# Patient Record
Sex: Female | Born: 1980
Health system: Southern US, Community
[De-identification: ages and names within clinical notes are randomized; demographics above are authoritative.]

## PROBLEM LIST (undated history)

## (undated) DIAGNOSIS — I319 Disease of pericardium, unspecified: Secondary | ICD-10-CM

## (undated) DIAGNOSIS — M329 Systemic lupus erythematosus, unspecified: Secondary | ICD-10-CM

## (undated) DIAGNOSIS — R091 Pleurisy: Secondary | ICD-10-CM

## (undated) DIAGNOSIS — J40 Bronchitis, not specified as acute or chronic: Secondary | ICD-10-CM

## (undated) DIAGNOSIS — M797 Fibromyalgia: Secondary | ICD-10-CM

## (undated) DIAGNOSIS — IMO0002 Reserved for concepts with insufficient information to code with codable children: Secondary | ICD-10-CM

## (undated) HISTORY — DX: Pleurisy: R09.1

## (undated) HISTORY — PX: APPENDECTOMY: SHX54

## (undated) HISTORY — PX: ANKLE SURGERY: SHX546

---

## 2006-12-27 ENCOUNTER — Emergency Department (HOSPITAL_COMMUNITY): Admission: EM | Admit: 2006-12-27 | Discharge: 2006-12-28 | Payer: Self-pay | Admitting: *Deleted

## 2007-08-17 ENCOUNTER — Emergency Department (HOSPITAL_COMMUNITY): Admission: EM | Admit: 2007-08-17 | Discharge: 2007-08-17 | Payer: Self-pay | Admitting: Emergency Medicine

## 2007-08-20 ENCOUNTER — Observation Stay (HOSPITAL_COMMUNITY): Admission: RE | Admit: 2007-08-20 | Discharge: 2007-08-21 | Payer: Self-pay | Admitting: Orthopedic Surgery

## 2011-02-13 NOTE — Op Note (Signed)
Diana Huynh, Diana Huynh          ACCOUNT NO.:  [id number]   MEDICAL RECORD NO.:  [id number]          PATIENT TYPE:  OIB   LOCATION:  5041                         FACILITY:  [facility]   PHYSICIAN:  Nadara Mustard, MD     DATE OF BIRTH:  1981-06-05   DATE OF PROCEDURE:  08/20/2007  DATE OF DISCHARGE:                               OPERATIVE REPORT   PREOPERATIVE DIAGNOSIS:  Trimalleolar right ankle fracture.   POSTOPERATIVE DIAGNOSIS:  Trimalleolar right ankle fracture.   PROCEDURE:  Open reduction internal fixation, right ankle.   SURGEON:  Nadara Mustard, MD   ANESTHESIA:  General.   ESTIMATED BLOOD LOSS:  Minimal.   ANTIBIOTICS:  1 gram of Kefzol.   DRAINS:  None.   COMPLICATIONS:  None.   TOURNIQUET TIME:  None.   DISPOSITION:  To PACU in stable condition.   INDICATIONS FOR PROCEDURE:  The patient is a 30 year old woman who fell  while skating sustaining a trimalleolar right ankle fracture.  The  patient was splinted and presented to the office yesterday and presents  today for open reduction internal fixation.  She had a displaced ankle  fracture which was unstable with displacement of the lateral malleolus,  medial malleolus and a small posterior trimalleolar fracture.  Risks and  benefits of surgery were discussed including infection, neurovascular  injury, persistent pain, nonhealing of the wound, the risks of wound  healing with smoking.  The patient states she understands and wished to  proceed with surgery at this time.   DESCRIPTION OF PROCEDURE:  The patient was brought to OR room 4 and  underwent a general anesthetic.  After adequate level of anesthesia  obtained, the patient's right lower extremity was prepped using DuraPrep  and draped into a sterile field.  A lateral incision was made.  This was  carried down sharply to the fracture site.  The fracture edges were  freshened and reduced.  A lag screw was placed to secure the fracture  site with the screw  perpendicular to the fracture.  A neutralization  plate was applied laterally with two screws distally and three screws  proximally.  The wound was irrigated with normal saline.  C-arm  fluoroscopy verified reduction.  The subcu was closed using 2-0 Vicryl.  Skin was closed using Proximate staples.  Attention was then focused on  the medial malleolus.  Incision was made over the medial malleolus.  This was carried sharply down to the fracture site.  The edges were  freshened.  Both the medial lateral wounds were irrigated.  The joint  was irrigated and the fracture reduced.  Two 2.5 drill bits were then  placed across the fracture site and each drill bit was sequentially  removed and replaced with a 207 45 mm length partially threaded 3.5  cortical screw.  C-arm fluoroscopy verified reduction in  both AP and lateral planes.  The wound was again irrigated.  Subcu was  closed using 2-0 Vicryl.  Skin was closed using Proximate staples.  The  wound was covered with Adaptic orthopedic sponges, sterile Webril and  Coban dressing.  The patient was  extubated, taken to PACU in stable  condition.      Nadara Mustard, MD  Electronically Signed     MVD/MEDQ  D:  08/20/2007  T:  08/20/2007  Job:  [id number]

## 2011-07-10 LAB — HCG, SERUM, QUALITATIVE: Preg, Serum: NEGATIVE

## 2011-07-10 LAB — CBC
Hemoglobin: 12.6
RDW: 14.5

## 2012-08-20 ENCOUNTER — Emergency Department (HOSPITAL_COMMUNITY)
Admission: EM | Admit: 2012-08-20 | Discharge: 2012-08-20 | Disposition: A | Discharge status: Home / Self Care | Payer: Medicaid Other | Attending: Emergency Medicine | Admitting: Emergency Medicine

## 2012-08-20 ENCOUNTER — Encounter (HOSPITAL_COMMUNITY): Payer: Self-pay | Admitting: *Deleted

## 2012-08-20 DIAGNOSIS — F17200 Nicotine dependence, unspecified, uncomplicated: Secondary | ICD-10-CM | POA: Insufficient documentation

## 2012-08-20 DIAGNOSIS — R61 Generalized hyperhidrosis: Secondary | ICD-10-CM | POA: Insufficient documentation

## 2012-08-20 DIAGNOSIS — R197 Diarrhea, unspecified: Secondary | ICD-10-CM | POA: Insufficient documentation

## 2012-08-20 DIAGNOSIS — K2990 Gastroduodenitis, unspecified, without bleeding: Secondary | ICD-10-CM | POA: Insufficient documentation

## 2012-08-20 DIAGNOSIS — R112 Nausea with vomiting, unspecified: Secondary | ICD-10-CM | POA: Insufficient documentation

## 2012-08-20 DIAGNOSIS — K2970 Gastritis, unspecified, without bleeding: Secondary | ICD-10-CM | POA: Insufficient documentation

## 2012-08-20 LAB — CBC WITH DIFFERENTIAL/PLATELET
Basophils Absolute: 0 10*3/uL (ref 0.0–0.1)
Basophils Relative: 0 % (ref 0–1)
Eosinophils Absolute: 0.1 10*3/uL (ref 0.0–0.7)
Eosinophils Relative: 1 % (ref 0–5)
Lymphs Abs: 1.2 10*3/uL (ref 0.7–4.0)
MCV: 84 fL (ref 78.0–100.0)
Monocytes Absolute: 0.5 10*3/uL (ref 0.1–1.0)
Neutrophils Relative %: 84 % — ABNORMAL HIGH (ref 43–77)
RBC: 4.82 MIL/uL (ref 3.87–5.11)

## 2012-08-20 LAB — COMPREHENSIVE METABOLIC PANEL
Albumin: 4.2 g/dL (ref 3.5–5.2)
Alkaline Phosphatase: 53 U/L (ref 39–117)
CO2: 25 mEq/L (ref 19–32)
Chloride: 100 mEq/L (ref 96–112)
GFR calc non Af Amer: >90 mL/min (ref >90)
Sodium: 138 mEq/L (ref 135–145)
Total Bilirubin: 0.6 mg/dL (ref 0.3–1.2)
Total Protein: 8.1 g/dL (ref 6.0–8.3)

## 2012-08-20 LAB — URINE MICROSCOPIC-ADD ON

## 2012-08-20 LAB — URINALYSIS, ROUTINE W REFLEX MICROSCOPIC: Glucose, UA: NEGATIVE mg/dL

## 2012-08-20 LAB — LIPASE, BLOOD: Lipase: 17 U/L (ref 11–59)

## 2012-08-20 MED ORDER — FAMOTIDINE 20 MG PO TABS: 20.0000 mg | ORAL_TABLET | Freq: Two times a day (BID) | ORAL | Status: DC

## 2012-08-20 MED ORDER — SODIUM CHLORIDE 0.9 % IV BOLUS (SEPSIS)
1000.0000 mL | Freq: Once | INTRAVENOUS | Status: AC
  Administered 2012-08-20: 1000 mL via INTRAVENOUS

## 2012-08-20 MED ORDER — OMEPRAZOLE 20 MG PO CPDR: 40.0000 mg | DELAYED_RELEASE_CAPSULE | Freq: Every day | ORAL | Status: DC

## 2012-08-20 MED ORDER — GI COCKTAIL ~~LOC~~
30.0000 mL | Freq: Once | ORAL | Status: AC
  Administered 2012-08-20: 30 mL via ORAL
  Filled 2012-08-20: qty 30

## 2012-08-20 MED ORDER — POTASSIUM CHLORIDE CRYS ER 20 MEQ PO TBCR
40.0000 meq | EXTENDED_RELEASE_TABLET | Freq: Once | ORAL | Status: AC
  Administered 2012-08-20: 40 meq via ORAL
  Filled 2012-08-20: qty 2

## 2012-08-20 MED ORDER — MORPHINE SULFATE 4 MG/ML IJ SOLN
4.0000 mg | Freq: Once | INTRAMUSCULAR | Status: AC
  Administered 2012-08-20: 4 mg via INTRAVENOUS
  Filled 2012-08-20: qty 1

## 2012-08-20 MED ORDER — ONDANSETRON HCL 4 MG/2ML IJ SOLN
4.0000 mg | Freq: Once | INTRAMUSCULAR | Status: AC
  Administered 2012-08-20: 4 mg via INTRAVENOUS
  Filled 2012-08-20: qty 2

## 2012-08-20 MED ORDER — PANTOPRAZOLE SODIUM 40 MG IV SOLR
40.0000 mg | Freq: Once | INTRAVENOUS | Status: AC
  Administered 2012-08-20: 40 mg via INTRAVENOUS
  Filled 2012-08-20: qty 40

## 2012-08-20 NOTE — ED Provider Notes (Signed)
History     CSN: [id number]  Arrival date & time 08/20/12  4098   First MD Initiated Contact with Patient 08/20/12 2011      Chief Complaint  Patient presents with  . Abdominal Pain   HPI  History provided by the patient. Patient is a 31 year old female with history of appendectomy who presents with complaints of upper abdominal pain with episodes of vomiting. Patient reports symptoms began acutely around 5:30 AM this morning and have been recurrent throughout the day. She reports a constant and generalized ache and pain in her abdomen. Pain initially started lower but is primarily in the upper abdomen. There are episodes of worsening pains. She reports at least one episode of vomiting every hour. She also reports 2 episodes of loose "diarrhea" like stools. Patient denies any associated fever or chills but has some hot flashes and sweats. Patient also reports beginning her menstrual cycle early 2 days ago. She denies any other significant changes in menstruation. She has not had any recent vaginal discharge, rash or irritation. Denies any dysuria, hematuria, urinary frequency or flank pain.   History reviewed. No pertinent past medical history.  Past Surgical History  Procedure Date  . Appendectomy     History reviewed. No pertinent family history.  History  Substance Use Topics  . Smoking status: Current Every Day Smoker  . Smokeless tobacco: Not on file  . Alcohol Use: Yes    OB History    Grav Para Term Preterm Abortions TAB SAB Ect Mult Living                  Review of Systems  Constitutional: Positive for diaphoresis. Negative for fever and chills.  Respiratory: Negative for cough and shortness of breath.   Gastrointestinal: Positive for nausea, vomiting, abdominal pain and diarrhea. Negative for blood in stool and rectal pain.  Genitourinary: Negative for dysuria, frequency, hematuria, flank pain, vaginal discharge, vaginal pain and pelvic pain.  Skin: Negative for  rash.  All other systems reviewed and are negative.    Allergies  Review of patient's allergies indicates no known allergies.  Home Medications   Current Outpatient Rx  Name  Route  Sig  Dispense  Refill  . ADULT MULTIVITAMIN W/MINERALS CH   Oral   Take 1 tablet by mouth daily.         Marland Kitchen NAPROXEN SODIUM 220 MG PO TABS   Oral   Take 220 mg by mouth as needed. Pain           BP 123/71  Pulse 94  Temp 98.6 F (37 C) (Oral)  Resp 18  SpO2 99%  LMP 08/20/2012  Physical Exam  Nursing note and vitals reviewed. Constitutional: She is oriented to person, place, and time. She appears well-developed and well-nourished. No distress.  HENT:  Head: Normocephalic.  Cardiovascular: Normal rate and regular rhythm.   No murmur heard. Pulmonary/Chest: Effort normal and breath sounds normal. No respiratory distress. She has no wheezes. She has no rales.  Abdominal: Soft. There is no hepatosplenomegaly. There is tenderness in the right upper quadrant and epigastric area. There is no rebound, no guarding, no CVA tenderness, no tenderness at McBurney's point and negative Murphy's sign.       Mild diffuse tenderness. Moderate tenderness in the epigastric and right upper quadrant  Neurological: She is alert and oriented to person, place, and time.  Skin: Skin is warm and dry. No rash noted.  Psychiatric: She has a normal mood  and affect. Her behavior is normal.    ED Course  Procedures   Results for orders placed during the hospital encounter of 08/20/12  CBC WITH DIFFERENTIAL      Component Value Range   WBC 11.5 (*) 4.0 - 10.5 K/uL   RBC 4.82  3.87 - 5.11 MIL/uL   Hemoglobin 13.2  12.0 - 15.0 g/dL   HCT 16.1  09.6 - 04.5 %   MCV 84.0  78.0 - 100.0 fL   MCH 27.4  26.0 - 34.0 pg   MCHC 32.6  30.0 - 36.0 g/dL   RDW 40.9  81.1 - 91.4 %   Platelets 287  150 - 400 K/uL   Neutrophils Relative 84 (*) 43 - 77 %   Neutro Abs 9.6 (*) 1.7 - 7.7 K/uL   Lymphocytes Relative 11 (*) 12 -  46 %   Lymphs Abs 1.2  0.7 - 4.0 K/uL   Monocytes Relative 5  3 - 12 %   Monocytes Absolute 0.5  0.1 - 1.0 K/uL   Eosinophils Relative 1  0 - 5 %   Eosinophils Absolute 0.1  0.0 - 0.7 K/uL   Basophils Relative 0  0 - 1 %   Basophils Absolute 0.0  0.0 - 0.1 K/uL  COMPREHENSIVE METABOLIC PANEL      Component Value Range   Sodium 138  135 - 145 mEq/L   Potassium 3.3 (*) 3.5 - 5.1 mEq/L   Chloride 100  96 - 112 mEq/L   CO2 25  19 - 32 mEq/L   Glucose, Bld 94  70 - 99 mg/dL   BUN 10  6 - 23 mg/dL   Creatinine, Ser 7.82  0.50 - 1.10 mg/dL   Calcium 9.5  8.4 - 95.6 mg/dL   Total Protein 8.1  6.0 - 8.3 g/dL   Albumin 4.2  3.5 - 5.2 g/dL   AST 18  0 - 37 U/L   ALT 20  0 - 35 U/L   Alkaline Phosphatase 53  39 - 117 U/L   Total Bilirubin 0.6  0.3 - 1.2 mg/dL   GFR calc non Af Amer >90  >90 mL/min   GFR calc Af Amer >90  >90 mL/min  URINALYSIS, ROUTINE W REFLEX MICROSCOPIC      Component Value Range   Color, Urine AMBER (*) YELLOW   APPearance CLOUDY (*) CLEAR   Specific Gravity, Urine 1.035 (*) 1.005 - 1.030   pH 6.5  5.0 - 8.0   Glucose, UA NEGATIVE  NEGATIVE mg/dL   Hgb urine dipstick LARGE (*) NEGATIVE   Bilirubin Urine NEGATIVE  NEGATIVE   Ketones, ur TRACE (*) NEGATIVE mg/dL   Protein, ur 30 (*) NEGATIVE mg/dL   Urobilinogen, UA 1.0  0.0 - 1.0 mg/dL   Nitrite NEGATIVE  NEGATIVE   Leukocytes, UA SMALL (*) NEGATIVE  LIPASE, BLOOD      Component Value Range   Lipase 17  11 - 59 U/L  POCT PREGNANCY, URINE      Component Value Range   Preg Test, Ur NEGATIVE  NEGATIVE  URINE MICROSCOPIC-ADD ON      Component Value Range   Squamous Epithelial / LPF MANY (*) RARE   WBC, UA 0-2  <3 WBC/hpf   RBC / HPF 3-6  <3 RBC/hpf   Bacteria, UA RARE  RARE       1. Gastritis       MDM  8:15 PM patient seen and evaluated. Patient lying in  bed appears comfortable in no acute distress.  Patient with significant tenderness in the right upper quadrant and epigastric area. And no clear  Murphy's sign. At this time suspect possible biliary source versus gastritis. Will begin workup with labs and medications.        Angus Seller, [place] 08/22/12 [id number]

## 2012-08-20 NOTE — ED Notes (Signed)
Pt c/o abdominal pain and vomiting since this am, sts shes on her mens. Cycle, c/o cramps, sts shes having "hot flashes", denies fever

## 2012-08-20 NOTE — ED Notes (Signed)
MD at bedside. 

## 2012-08-23 NOTE — ED Provider Notes (Signed)
Medical screening examination/treatment/procedure(s) were performed by non-physician practitioner and as supervising physician I was immediately available for consultation/collaboration.   Kamila Broda, MD 08/23/12 0738 

## 2014-11-01 ENCOUNTER — Emergency Department (HOSPITAL_COMMUNITY)
Admission: EM | Admit: 2014-11-01 | Discharge: 2014-11-01 | Disposition: A | Discharge status: Home / Self Care | Payer: Medicaid Other | Attending: Emergency Medicine | Admitting: Emergency Medicine

## 2014-11-01 ENCOUNTER — Encounter (HOSPITAL_COMMUNITY): Payer: Self-pay | Admitting: Emergency Medicine

## 2014-11-01 DIAGNOSIS — Z3202 Encounter for pregnancy test, result negative: Secondary | ICD-10-CM | POA: Insufficient documentation

## 2014-11-01 DIAGNOSIS — Y998 Other external cause status: Secondary | ICD-10-CM | POA: Insufficient documentation

## 2014-11-01 DIAGNOSIS — M791 Myalgia, unspecified site: Secondary | ICD-10-CM

## 2014-11-01 DIAGNOSIS — Y9289 Other specified places as the place of occurrence of the external cause: Secondary | ICD-10-CM | POA: Insufficient documentation

## 2014-11-01 DIAGNOSIS — Z79899 Other long term (current) drug therapy: Secondary | ICD-10-CM | POA: Insufficient documentation

## 2014-11-01 DIAGNOSIS — S161XXA Strain of muscle, fascia and tendon at neck level, initial encounter: Secondary | ICD-10-CM | POA: Insufficient documentation

## 2014-11-01 DIAGNOSIS — Z720 Tobacco use: Secondary | ICD-10-CM | POA: Insufficient documentation

## 2014-11-01 DIAGNOSIS — Y9389 Activity, other specified: Secondary | ICD-10-CM | POA: Insufficient documentation

## 2014-11-01 LAB — POC URINE PREG, ED: Preg Test, Ur: NEGATIVE

## 2014-11-01 MED ORDER — IBUPROFEN 800 MG PO TABS
800.0000 mg | ORAL_TABLET | Freq: Once | ORAL | Status: AC
  Administered 2014-11-01: 800 mg via ORAL
  Filled 2014-11-01: qty 1

## 2014-11-01 MED ORDER — HYDROCODONE-ACETAMINOPHEN 5-325 MG PO TABS: 1.0000 | ORAL_TABLET | Freq: Four times a day (QID) | ORAL | Status: DC | PRN

## 2014-11-01 MED ORDER — CYCLOBENZAPRINE HCL 10 MG PO TABS: 10.0000 mg | ORAL_TABLET | Freq: Every day | ORAL | Status: DC

## 2014-11-01 MED ORDER — IBUPROFEN 800 MG PO TABS: 800.0000 mg | ORAL_TABLET | Freq: Three times a day (TID) | ORAL | Status: DC

## 2014-11-01 MED ORDER — HYDROCODONE-ACETAMINOPHEN 5-325 MG PO TABS
1.0000 | ORAL_TABLET | Freq: Once | ORAL | Status: AC
  Administered 2014-11-01: 1 via ORAL
  Filled 2014-11-01: qty 1
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
## 2014-11-01 NOTE — Discharge Instructions (Signed)
Call for a follow up appointment with a Family or Primary Care Provider.  Return if Symptoms worsen.   Take medication as prescribed.  Ice your neck and shoulder 3-4 times a day.  Emergency Department Resource Guide 1) Find a Doctor and Pay Out of Pocket Although you won't have to find out who is covered by your insurance plan, it is a good idea to ask around and get recommendations. You will then need to call the office and see if the doctor you have chosen will accept you as a new patient and what types of options they offer for patients who are self-pay. Some doctors offer discounts or will set up payment plans for their patients who do not have insurance, but you will need to ask so you aren't surprised when you get to your appointment.  2) Contact Your Local Health Department Not all health departments have doctors that can see patients for sick visits, but many do, so it is worth a call to see if yours does. If you don't know where your local health department is, you can check in your phone book. The CDC also has a tool to help you locate your state's health department, and many state websites also have listings of all of their local health departments.  3) Find a Walk-in Clinic If your illness is not likely to be very severe or complicated, you may want to try a walk in clinic. These are popping up all over the country in pharmacies, drugstores, and shopping centers. They're usually staffed by nurse practitioners or physician assistants that have been trained to treat common illnesses and complaints. They're usually fairly quick and inexpensive. However, if you have serious medical issues or chronic medical problems, these are probably not your [place] option.  No Primary Care Doctor: - Call Health Connect at  [phone] - they can help you locate a primary care doctor that  accepts your insurance, provides certain services, etc. - Physician Referral Service- [phone]  Chronic Pain  Problems: Organization         Address  Phone   Notes  [facility]  [phone] Patients need to be referred by their primary care doctor.   Medication Assistance: Organization         Address  Phone   Notes  [facility] [address] [place], [place] [address] [phone] --Must be a resident of [facility] [place] -- Must have NO insurance coverage whatsoever (no Medicaid/ Medicare, etc.) -- The pt. MUST have a primary care doctor that directs their care regularly and follows them in the community   MedAssist  [phone]   [employer]  [phone]    Agencies that provide inexpensive medical care: Organization         Address  Phone   Notes  [facility]  [phone]   [facility]    [phone]   [facility] [address] [place], [place] [address] [phone]   [facility] [address], [place] [phone]   [facility]    [phone]   [facility]    [phone]   [facility]  [address], [place] Phone:  [phone], Fax:  [phone] Hours of Operation:  9 am - 6 pm, M-F.  Also accepts Medicaid/Medicare and self-pay.  [facility]  [address], [place] Phone: [phone], Fax: [phone]. Hours of Operation:  8:30 am - 5:30 pm, M-F.  Also accepts Medicaid and self-pay.  °[facility] [place] [address], [place] Phone: [phone]   °[facility] [address], [place], [place] [phone], Ext. [phone] Mondays & Thursdays: 7-9 AM.  First 15 patients are seen on a first come, first serve basis. °  ° °Medicaid-accepting [place] Providers: ° °Organization         Address  Phone   Notes  °[facility] [address], [place] [phone] Also  accepts self-pay patients.  °[facility] [address], [place] ° [phone]   °[facility] [address], [place] [phone]   °[facility] [address] [place] [address], [place] [phone]   °Veita Bland [address], [place]  ° [phone] Only accepts [place] Access Medicaid patients after they have their name applied to their card.  ° °Self-Pay (no insurance) in [place]: ° °Organization         Address  Phone   Notes  °Sickle Cell Patients, [facility] [address], [place] [phone]   °[facility] [address], [place] [phone]   °[facility] ° [address], [place] [phone]   °[facility]/Dr. Osei-Bonsu ° [address] [place] [address], [place] or [address], [place] [phone] Phone number for both [place] and [place] locations is the same.  °[facility] [address], [place] [phone]   °[facility] [address] [place] [address], [place] or [address] [phone] °([phone]   °[facility] [address], [place] [phone], phone; [phone], fax Sees patients 1st and 3rd Saturday of every month.  Must not qualify for public or private insurance (i.e. Medicaid, Medicare, [place] Health Choice, Veterans' Benefits) • Household income should be no more than 200% of the poverty level •The clinic cannot treat you if you are pregnant or think you are pregnant • Sexually transmitted diseases are not treated at the clinic.  ° ° °Dental Care: °Organization         Address  Phone  Notes  °[place] [facility] [address], [place] [phone] Accepts children up to age 21 who are enrolled in Medicaid or [place] Health Choice; pregnant  women with a Medicaid card; and children who have applied for Medicaid or [place] Health Choice, but were declined, whose parents can pay a reduced fee at time of service.  °[place] [facility] [place]  [address], [place] [phone] Accepts children up to age 21 who are enrolled in Medicaid or [place] Health Choice; pregnant women with a Medicaid card; and children who have applied for Medicaid or  Health Choice, but were declined, whose parents can pay a reduced fee at time of service.  °[facility] ° [address],  [phone] Patients are seen by appointment only. Walk-ins are not accepted. [facility] will see patients 18 years of age and older. °Monday - Tuesday (8am-5pm) °Most Wednesdays (8:30-5pm) °$30 per visit, cash only  °[facility]  [address], [facility] [phone] Patients are seen by appointment only. Walk-ins are not accepted. [place] will see patients 60 years of age and older. One Wednesday Evening (Monthly: Volunteer Based).  $30 per visit, cash only  [place]  [phone] for adults; Children under age 4, call Graduate Pediatric Dentistry at [phone]. Children aged 49-14, please call [phone] to request a pediatric application.  Dental services are provided in all areas of dental care including fillings, crowns and bridges, complete and partial dentures, implants, gum treatment, root canals, and extractions. Preventive care is also provided. Treatment is provided to both adults and children. Patients are selected via a lottery and there is often a waiting list.   [facility] [address] [place]  [phone] [email or web address]   [facility] [address] [place], [place] [phone], Ext. [phone] Second and Fourth Thursday of each month, opens at 6:30 AM; Clinic ends at 9 AM.  Patients are  seen on a first-come first-served basis, and a limited number are seen during each clinic.   [facility]  [address] Hillard Danker [place], [place] [phone]   Eligibility Requirements You must have lived in [place], [place], or [place] counties for at least the last three months.   You cannot be eligible for state or federal sponsored [employer], including [employer], [place], or [employer].   You generally cannot be eligible for healthcare insurance through your employer.    How to apply: Eligibility screenings are held every Tuesday and Wednesday afternoon from 1:00 pm until 4:00 pm. You do not need an appointment for the interview!  [facility] [address] [place], [place]   [place] Department  [place]  [phone]    Behavioral Health Resources in the Community: Intensive Outpatient Programs Organization         Address  Phone  Notes  [facility] [place]. [address], [place], [place] [phone]   [facility] [address], [place], [place]   ADS: [facility] [address], [place], [place]   [place] [address],  [place], [place] or [phone]   Substance Abuse Resources Organization         Address  Phone  Notes  Al[facility] Drug Services  [phone]   [place]  [phone]   [facility] [place]   Chinita Pester  [phone]   Residential & Outpatient Substance Abuse Program  [phone]   Psychological Services Organization         Address  Phone  Notes  [facility] [place]  [place]  [phone]   [place] [address] [place] [phone] or [phone]    Mobile Crisis  Teams Organization         Address  Phone  Notes  [facility], Mobile Crisis Care Unit  [phone]   [facility]  [address] [place], [place]   [facility] Levels [address] [place] [phone]    Self-Help/Support Groups Organization         Address  Phone             Notes  [facility] [place] - variety of support groups  [place] Call for more information  Narcotics Anonymous (NA), [facility] [address], [employer] [place]  2 meetings at this location   Special educational needs teacher         Address  Phone  Notes  [facility] [place],    [place]  [phone]   [facility]  [address] [place] [address], [place], [place]   [place], [place] [phone] Admissions: 8am-3pm M-F  Incentives Substance [place] [address],    [place], [place] [phone]   [facility] [address] [place], [place], [place]   [facility] [address]  [place], [place]   [facility] - Intensive Outpatient [address], [place], [place]   [facility] ([place]  [place], [place] [phone] or [phone]   [facility] ([facility]) [address] [place], [place] Accepts Medicaid  [facility] [place] [address],  [place] [phone] Substance Abuse/Addiction Treatment   [facility] Organization         Address  Phone  Notes  [facility]  [phone]   Domenic Schwab, PhD [address] Arlis [place], [place]   [phone] or [phone]   [facility] [place], [place] [phone]   [facility] [address] [place], [place] [phone]  Insurance/Medicaid/sponsorship through [facility] and Families [address] [place]                                    [place], [place] [phone] [place] [address], [place] [phone]    Dr. Adele Schilder  [phone]   [facility]. 1) [address], [place] 2) [place] 3)  [place] [address], [place] [phone]  [phone]   [place] [phone] or [phone] (After Hours)
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
## 2015-04-19 ENCOUNTER — Emergency Department (HOSPITAL_COMMUNITY)
Admission: EM | Admit: 2015-04-19 | Discharge: 2015-04-19 | Disposition: A | Discharge status: Home / Self Care | Payer: Self-pay | Attending: Emergency Medicine | Admitting: Emergency Medicine

## 2015-04-19 ENCOUNTER — Emergency Department (HOSPITAL_COMMUNITY): Payer: Medicaid Other

## 2015-04-19 ENCOUNTER — Encounter (HOSPITAL_COMMUNITY): Payer: Self-pay

## 2015-04-19 DIAGNOSIS — R000 Tachycardia, unspecified: Secondary | ICD-10-CM | POA: Insufficient documentation

## 2015-04-19 DIAGNOSIS — Z720 Tobacco use: Secondary | ICD-10-CM | POA: Insufficient documentation

## 2015-04-19 DIAGNOSIS — Z79899 Other long term (current) drug therapy: Secondary | ICD-10-CM | POA: Insufficient documentation

## 2015-04-19 DIAGNOSIS — R079 Chest pain, unspecified: Secondary | ICD-10-CM

## 2015-04-19 DIAGNOSIS — M549 Dorsalgia, unspecified: Secondary | ICD-10-CM | POA: Insufficient documentation

## 2015-04-19 DIAGNOSIS — R0789 Other chest pain: Secondary | ICD-10-CM | POA: Insufficient documentation

## 2015-04-19 LAB — CBC WITH DIFFERENTIAL/PLATELET
Basophils Absolute: 0 10*3/uL (ref 0.0–0.1)
Basophils Relative: 1 % (ref 0–1)
EOS ABS: 0.1 10*3/uL (ref 0.0–0.7)
Eosinophils Relative: 2 % (ref 0–5)
HCT: 38 % (ref 36.0–46.0)
Hemoglobin: 12.6 g/dL (ref 12.0–15.0)
Lymphocytes Relative: 24 % (ref 12–46)
Lymphs Abs: 1.4 10*3/uL (ref 0.7–4.0)
MCH: 29 pg (ref 26.0–34.0)
MCHC: 33.2 g/dL (ref 30.0–36.0)
MCV: 87.6 fL (ref 78.0–100.0)
Monocytes Absolute: 0.4 10*3/uL (ref 0.1–1.0)
Monocytes Relative: 6 % (ref 3–12)
NEUTROS ABS: 3.9 10*3/uL (ref 1.7–7.7)
Neutrophils Relative %: 67 % (ref 43–77)
PLATELETS: 268 10*3/uL (ref 150–400)
RBC: 4.34 MIL/uL (ref 3.87–5.11)
RDW: 14.2 % (ref 11.5–15.5)
WBC: 5.7 10*3/uL (ref 4.0–10.5)

## 2015-04-19 LAB — TROPONIN I: Troponin I: <0.03 ng/mL (ref <0.031)

## 2015-04-19 LAB — BASIC METABOLIC PANEL
Anion gap: 5 (ref 5–15)
BUN: 11 mg/dL (ref 6–20)
CO2: 27 mmol/L (ref 22–32)
Calcium: 9 mg/dL (ref 8.9–10.3)
Chloride: 105 mmol/L (ref 101–111)
Creatinine, Ser: 0.67 mg/dL (ref 0.44–1.00)
GFR calc Af Amer: >60 mL/min (ref >60)
Glucose, Bld: 99 mg/dL (ref 65–99)
Potassium: 4.1 mmol/L (ref 3.5–5.1)
Sodium: 137 mmol/L (ref 135–145)

## 2015-04-19 LAB — D-DIMER, QUANTITATIVE (NOT AT ARMC): D DIMER QUANT: 0.27 ug{FEU}/mL (ref 0.00–0.48)

## 2015-04-19 MED ORDER — METHOCARBAMOL 500 MG PO TABS: 500.0000 mg | ORAL_TABLET | Freq: Two times a day (BID) | ORAL | Status: DC

## 2015-04-19 MED ORDER — OXYCODONE-ACETAMINOPHEN 5-325 MG PO TABS: 2.0000 | ORAL_TABLET | ORAL | Status: DC | PRN

## 2015-04-19 MED ORDER — SODIUM CHLORIDE 0.9 % IV SOLN: INTRAVENOUS | Status: DC

## 2015-04-19 MED ORDER — KETOROLAC TROMETHAMINE 30 MG/ML IJ SOLN
30.0000 mg | Freq: Once | INTRAMUSCULAR | Status: AC
  Administered 2015-04-19: 30 mg via INTRAVENOUS
  Filled 2015-04-19: qty 1

## 2015-04-19 MED ORDER — MORPHINE SULFATE 4 MG/ML IJ SOLN
4.0000 mg | Freq: Once | INTRAMUSCULAR | Status: AC
  Administered 2015-04-19: 4 mg via INTRAVENOUS
  Filled 2015-04-19: qty 1

## 2015-04-19 MED ORDER — SODIUM CHLORIDE 0.9 % IV BOLUS (SEPSIS)
1000.0000 mL | Freq: Once | INTRAVENOUS | Status: AC
  Administered 2015-04-19: 1000 mL via INTRAVENOUS

## 2015-04-19 MED ORDER — LORAZEPAM 2 MG/ML IJ SOLN
1.0000 mg | Freq: Once | INTRAMUSCULAR | Status: AC
  Administered 2015-04-19: 1 mg via INTRAVENOUS
  Filled 2015-04-19: qty 1

## 2015-04-19 NOTE — Discharge Instructions (Signed)
Chest Wall Pain °Chest wall pain is pain in or around the bones and muscles of your chest. It may take up to 6 weeks to get better. It may take longer if you must stay physically active in your work and activities.  °CAUSES  °Chest wall pain may happen on its own. However, it may be caused by: °· A viral illness like the flu. °· Injury. °· Coughing. °· Exercise. °· Arthritis. °· Fibromyalgia. °· Shingles. °HOME CARE INSTRUCTIONS  °· Avoid overtiring physical activity. Try not to strain or perform activities that cause pain. This includes any activities using your chest or your abdominal and side muscles, especially if heavy weights are used. °· Put ice on the sore area. °¨ Put ice in a plastic bag. °¨ Place a towel between your skin and the bag. °¨ Leave the ice on for 15-20 minutes per hour while awake for the first 2 days. °· Only take over-the-counter or prescription medicines for pain, discomfort, or fever as directed by your caregiver. °SEEK IMMEDIATE MEDICAL CARE IF:  °· Your pain increases, or you are very uncomfortable. °· You have a fever. °· Your chest pain becomes worse. °· You have new, unexplained symptoms. °· You have nausea or vomiting. °· You feel sweaty or lightheaded. °· You have a cough with phlegm (sputum), or you cough up blood. °MAKE SURE YOU:  °· Understand these instructions. °· Will watch your condition. °· Will get help right away if you are not doing well or get worse. °Document Released: 09/17/2005 Document Revised: 12/10/2011 Document Reviewed: 05/14/2011 °ExitCare® Patient Information ©2015 [employer]. This information is not intended to replace advice given to you by your health care provider. Make sure you discuss any questions you have with your health care provider. ° °Chest Pain (Nonspecific) °It is often hard to give a specific diagnosis for the cause of chest pain. There is always a chance that your pain could be related to something serious, such as a heart attack or a blood  clot in the lungs. You need to follow up with your health care provider for further evaluation. °CAUSES  °· Heartburn. °· Pneumonia or bronchitis. °· Anxiety or stress. °· Inflammation around your heart (pericarditis) or lung (pleuritis or pleurisy). °· A blood clot in the lung. °· A collapsed lung (pneumothorax). It can develop suddenly on its own (spontaneous pneumothorax) or from trauma to the chest. °· Shingles infection (herpes zoster virus). °The chest wall is composed of bones, muscles, and cartilage. Any of these can be the source of the pain. °· The bones can be bruised by injury. °· The muscles or cartilage can be strained by coughing or overwork. °· The cartilage can be affected by inflammation and become sore (costochondritis). °DIAGNOSIS  °Lab tests or other studies may be needed to find the cause of your pain. Your health care provider may have you take a test called an ambulatory electrocardiogram (ECG). An ECG records your heartbeat patterns over a 24-hour period. You may also have other tests, such as: °· Transthoracic echocardiogram (TTE). During echocardiography, sound waves are used to evaluate how blood flows through your heart. °· Transesophageal echocardiogram (TEE). °· Cardiac monitoring. This allows your health care provider to monitor your heart rate and rhythm in real time. °· Holter monitor. This is a portable device that records your heartbeat and can help diagnose heart arrhythmias. It allows your health care provider to track your heart activity for several days, if needed. °· Stress tests by   exercise or by giving medicine that makes the heart beat faster. °TREATMENT  °· Treatment depends on what may be causing your chest pain. Treatment may include: °¨ Acid blockers for heartburn. °¨ Anti-inflammatory medicine. °¨ Pain medicine for inflammatory conditions. °¨ Antibiotics if an infection is present. °· You may be advised to change lifestyle habits. This includes stopping smoking and  avoiding alcohol, caffeine, and chocolate. °· You may be advised to keep your head raised (elevated) when sleeping. This reduces the chance of acid going backward from your stomach into your esophagus. °Most of the time, nonspecific chest pain will improve within 2-3 days with rest and mild pain medicine.  °HOME CARE INSTRUCTIONS  °· If antibiotics were prescribed, take them as directed. Finish them even if you start to feel better. °· For the next few days, avoid physical activities that bring on chest pain. Continue physical activities as directed. °· Do not use any tobacco products, including cigarettes, chewing tobacco, or electronic cigarettes. °· Avoid drinking alcohol. °· Only take medicine as directed by your health care provider. °· Follow your health care provider's suggestions for further testing if your chest pain does not go away. °· Keep any follow-up appointments you made. If you do not go to an appointment, you could develop lasting (chronic) problems with pain. If there is any problem keeping an appointment, call to reschedule. °SEEK MEDICAL CARE IF:  °· Your chest pain does not go away, even after treatment. °· You have a rash with blisters on your chest. °· You have a fever. °SEEK IMMEDIATE MEDICAL CARE IF:  °· You have increased chest pain or pain that spreads to your arm, neck, jaw, back, or abdomen. °· You have shortness of breath. °· You have an increasing cough, or you cough up blood. °· You have severe back or abdominal pain. °· You feel nauseous or vomit. °· You have severe weakness. °· You faint. °· You have chills. °This is an emergency. Do not wait to see if the pain will go away. Get medical help at once. Call your local emergency services (911 in [place] Do not drive yourself to the hospital. °MAKE SURE YOU:  °· Understand these instructions. °· Will watch your condition. °· Will get help right away if you are not doing well or get worse. °Document Released: 06/27/2005 Document Revised:  09/22/2013 Document Reviewed: 04/22/2008 °ExitCare® Patient Information ©2015 [employer]. This information is not intended to replace advice given to you by your health care provider. Make sure you discuss any questions you have with your health care provider. ° °

## 2015-04-19 NOTE — ED Provider Notes (Signed)
CSN: [id number]     Arrival date & time 04/19/15  0848 History   First MD Initiated Contact with Patient 04/19/15 [phone]     Chief Complaint  Patient presents with  . Chest Pain  . Back Pain     (Consider location/radiation/quality/duration/timing/severity/associated sxs/prior Treatment) HPI Comments: Patient here complaining of constant right-sided chest pain characterized as sharp and worse with movement. Symptoms present for 48 hours. She is right-handed. Denies any associated dyspnea or diaphoresis. No CHF type symptoms. Denies any fever or cough. Pain is better with remaining still. No leg pain or swelling. No recent travel history. Denies any prior history of coronary artery disease. No rashes to her chest. Has used OTCs without relief. No prior history of same.  Patient is a 34 y.o. female presenting with chest pain and back pain. The history is provided by the patient.  Chest Pain Associated symptoms: back pain   Back Pain Associated symptoms: chest pain     History reviewed. No pertinent past medical history. Past Surgical History  Procedure Laterality Date  . Appendectomy    . Ankle surgery    . Cesarean section     History reviewed. No pertinent family history. History  Substance Use Topics  . Smoking status: Current Every Day Smoker  . Smokeless tobacco: Not on file  . Alcohol Use: Yes   OB History    No data available     Review of Systems  Cardiovascular: Positive for chest pain.  Musculoskeletal: Positive for back pain.  All other systems reviewed and are negative.     Allergies  Review of patient's allergies indicates no known allergies.  Home Medications   Prior to Admission medications   Medication Sig Start Date End Date Taking? Authorizing Provider  cyclobenzaprine (FLEXERIL) 10 MG tablet Take 1 tablet (10 mg total) by mouth at bedtime. 11/01/14   Mellody Drown, PA-C  famotidine (PEPCID) 20 MG tablet Take 1 tablet (20 mg total) by mouth 2 (two)  times daily. 08/20/12   Ivonne Andrew, PA-C  HYDROcodone-acetaminophen (NORCO/VICODIN) 5-325 MG per tablet Take 1 tablet by mouth every 6 (six) hours as needed for moderate pain or severe pain. 11/01/14   Mellody Drown, PA-C  ibuprofen (ADVIL,MOTRIN) 800 MG tablet Take 1 tablet (800 mg total) by mouth 3 (three) times daily. 11/01/14   Mellody Drown, PA-C  Multiple Vitamin (MULTIVITAMIN WITH MINERALS) TABS Take 1 tablet by mouth daily.    Historical Provider, MD  naproxen sodium (ANAPROX) 220 MG tablet Take 220 mg by mouth as needed. Pain    Historical Provider, MD  omeprazole (PRILOSEC) 20 MG capsule Take 2 capsules (40 mg total) by mouth daily. 08/20/12   Peter Dammen, PA-C   BP 138/81 mmHg  Pulse 103  Temp(Src) 98.2 F (36.8 C) (Oral)  Resp 18  SpO2 100%  LMP 04/12/2015 Physical Exam  Constitutional: She is oriented to person, place, and time. She appears well-developed and well-nourished.  Non-toxic appearance. No distress.  HENT:  Head: Normocephalic and atraumatic.  Eyes: Conjunctivae, EOM and lids are normal. Pupils are equal, round, and reactive to light.  Neck: Normal range of motion. Neck supple. No tracheal deviation present. No thyroid mass present.  Cardiovascular: Regular rhythm and normal heart sounds.  Tachycardia present.  Exam reveals no gallop.   No murmur heard. Pulmonary/Chest: Effort normal and breath sounds normal. No stridor. No respiratory distress. She has no decreased breath sounds. She has no wheezes. She has no rhonchi. She has no  rales.    Abdominal: Soft. Normal appearance and bowel sounds are normal. She exhibits no distension. There is no tenderness. There is no rebound and no CVA tenderness.  Musculoskeletal: Normal range of motion. She exhibits no edema or tenderness.  Neurological: She is alert and oriented to person, place, and time. She has normal strength. No cranial nerve deficit or sensory deficit. GCS eye subscore is 4. GCS verbal subscore is 5. GCS  motor subscore is 6.  Skin: Skin is warm and dry. No abrasion and no rash noted.  Psychiatric: She has a normal mood and affect. Her speech is normal and behavior is normal.  Nursing note and vitals reviewed.   ED Course  Procedures (including critical care time) Labs Review Labs Reviewed  TROPONIN I  CBC WITH DIFFERENTIAL/PLATELET  BASIC METABOLIC PANEL  D-DIMER, QUANTITATIVE (NOT AT [facility])    Imaging Review No results found.   EKG Interpretation   Date/Time:  Tuesday April 19 2015 08:59:34 EDT Ventricular Rate:  89 PR Interval:  169 QRS Duration: 92 QT Interval:  391 QTC Calculation: 476 R Axis:   88 Text Interpretation:  Sinus rhythm Right atrial enlargement Probable  anteroseptal infarct, old Confirmed by Keaden Gunnoe  MD, Ilina Xu ([id number]) on  04/19/2015 9:04:18 AM      MDM   Final diagnoses:  Chest pain     Patient given medications for chest wall pain and feels better. No evidence of ACS or PE. Patient stable for discharge   Lorre NickAnthony Nialah Saravia, MD 04/19/15 1157

## 2015-04-19 NOTE — ED Notes (Addendum)
Per pt, chest pain since Sunday.  Pain constant since last night.  Pt pain worse with movement or arm.  Pt has had a cough.

## 2016-09-21 ENCOUNTER — Encounter (HOSPITAL_COMMUNITY): Payer: Self-pay | Admitting: Emergency Medicine

## 2016-09-21 ENCOUNTER — Ambulatory Visit (HOSPITAL_COMMUNITY)
Admission: EM | Admit: 2016-09-21 | Discharge: 2016-09-21 | Disposition: A | Discharge status: Home / Self Care | Payer: Medicaid Other | Attending: Family Medicine | Admitting: Family Medicine

## 2016-09-21 DIAGNOSIS — J40 Bronchitis, not specified as acute or chronic: Secondary | ICD-10-CM | POA: Diagnosis not present

## 2016-09-21 MED ORDER — HYDROCODONE-HOMATROPINE 5-1.5 MG/5ML PO SYRP: 5.0000 mL | ORAL_SOLUTION | Freq: Four times a day (QID) | ORAL | 0 refills | Status: DC | PRN

## 2016-09-21 MED ORDER — IPRATROPIUM BROMIDE 0.06 % NA SOLN: 2.0000 | Freq: Four times a day (QID) | NASAL | 1 refills | Status: DC

## 2016-09-21 MED ORDER — DOXYCYCLINE HYCLATE 100 MG PO CAPS: 100.0000 mg | ORAL_CAPSULE | Freq: Two times a day (BID) | ORAL | 0 refills | Status: DC

## 2016-09-21 NOTE — ED Triage Notes (Signed)
PT reports productive cough with green sputum and sore throat for four days. PT reports left shoulder pain that "feels like a pulled muscle." Shoulder pain started yesterday after moving boxes. PT also reports low back pain. PT reports shoulder pain worsens when moving affected arm and when coughing.

## 2016-09-21 NOTE — ED Provider Notes (Signed)
[facility]    CSN: [id number] Arrival date & time: 09/21/16  1001     History   Chief Complaint Chief Complaint  Patient presents with  . URI    HPI Diana Huynh is a 35 y.o. female.   The history is provided by the patient.  URI  Presenting symptoms: congestion, cough and rhinorrhea   Presenting symptoms: no fever   Severity:  Moderate Duration:  4 days Progression:  Worsening Chronicity:  New Relieved by:  None tried Worsened by:  Nothing Ineffective treatments:  None tried Associated symptoms: no wheezing   Risk factors comment:  Smoker   History reviewed. No pertinent past medical history.  There are no active problems to display for this patient.   Past Surgical History:  Procedure Laterality Date  . ANKLE SURGERY    . APPENDECTOMY    . CESAREAN SECTION      OB History    No data available       Home Medications    Prior to Admission medications   Medication Sig Start Date End Date Taking? Authorizing Provider  aspirin 500 MG tablet Take 500 mg by mouth every 6 (six) hours as needed for pain.    Historical Provider, MD  cyclobenzaprine (FLEXERIL) 10 MG tablet Take 1 tablet (10 mg total) by mouth at bedtime. Patient not taking: Reported on 04/19/2015 11/01/14   Mellody DrownLauren Parker, PA-C  dextromethorphan 15 MG/5ML syrup Take 10 mLs by mouth 4 (four) times daily as needed for cough.    Historical Provider, MD  doxycycline (VIBRAMYCIN) 100 MG capsule Take 1 capsule (100 mg total) by mouth 2 (two) times daily. 09/21/16   Linna HoffJames D Kindl, MD  famotidine (PEPCID) 20 MG tablet Take 1 tablet (20 mg total) by mouth 2 (two) times daily. Patient not taking: Reported on 04/19/2015 08/20/12   Ivonne AndrewPeter Dammen, PA-C  HYDROcodone-acetaminophen (NORCO/VICODIN) 5-325 MG per tablet Take 1 tablet by mouth every 6 (six) hours as needed for moderate pain or severe pain. Patient not taking: Reported on 04/19/2015 11/01/14   Mellody DrownLauren Parker, PA-C  HYDROcodone-homatropine  [facility](HYCODAN) 5-1.5 MG/5ML syrup Take 5 mLs by mouth every 6 (six) hours as needed for cough. Take 5ml every 6hrs for cough 09/21/16   Linna HoffJames D Kindl, MD  ibuprofen (ADVIL,MOTRIN) 800 MG tablet Take 1 tablet (800 mg total) by mouth 3 (three) times daily. Patient not taking: Reported on 04/19/2015 11/01/14   Mellody DrownLauren Parker, PA-C  ipratropium (ATROVENT) 0.06 % nasal spray Place 2 sprays into both nostrils 4 (four) times daily. 09/21/16   Linna HoffJames D Kindl, MD  methocarbamol (ROBAXIN) 500 MG tablet Take 1 tablet (500 mg total) by mouth 2 (two) times daily. 04/19/15   Lorre NickAnthony Allen, MD  Multiple Vitamin (MULTIVITAMIN WITH MINERALS) TABS Take 1 tablet by mouth daily.    Historical Provider, MD  omeprazole (PRILOSEC) 20 MG capsule Take 2 capsules (40 mg total) by mouth daily. Patient not taking: Reported on 04/19/2015 08/20/12   Ivonne AndrewPeter Dammen, PA-C  oxyCODONE-acetaminophen (PERCOCET/ROXICET) 5-325 MG per tablet Take 2 tablets by mouth every 4 (four) hours as needed for severe pain. 04/19/15   Lorre NickAnthony Allen, MD    Family History No family history on file.  Social History Social History  Substance Use Topics  . Smoking status: Current Every Day Smoker    Packs/day: 0.25  . Smokeless tobacco: Never Used  . Alcohol use Yes     Comment: 1 per week     Allergies  Patient has no known allergies.   Review of Systems Review of Systems  Constitutional: Negative.  Negative for fever.  HENT: Positive for congestion, postnasal drip and rhinorrhea.   Respiratory: Positive for cough. Negative for shortness of breath and wheezing.   Cardiovascular: Negative.   Gastrointestinal: Negative.      Physical Exam Triage Vital Signs ED Triage Vitals  Enc Vitals Group     BP 09/21/16 1015 133/82     Pulse Rate 09/21/16 1015 79     Resp 09/21/16 1015 16     Temp 09/21/16 1015 98.6 F (37 C)     Temp Source 09/21/16 1015 Oral     SpO2 09/21/16 1015 100 %     Weight 09/21/16 1013 130 lb (59 kg)     Height 09/21/16  1013 5\' 1"  (1.549 m)     Head Circumference --      Peak Flow --      Pain Score 09/21/16 1015 6     Pain Loc --      Pain Edu? --      Excl. in GC? --    No data found.   Updated Vital Signs BP 133/82   Pulse 79   Temp 98.6 F (37 C) (Oral)   Resp 16   Ht 5\' 1"  (1.549 m)   Wt 130 lb (59 kg)   LMP 08/19/2016   SpO2 100%   BMI 24.56 kg/m   Visual Acuity Right Eye Distance:   Left Eye Distance:   Bilateral Distance:    Right Eye Near:   Left Eye Near:    Bilateral Near:     Physical Exam  Constitutional: She appears well-developed and well-nourished. No distress.  HENT:  Right Ear: External ear normal.  Left Ear: External ear normal.  Nose: Nose normal.  Mouth/Throat: Oropharynx is clear and moist.  Eyes: Pupils are equal, round, and reactive to light.  Neck: Normal range of motion. Neck supple.  Cardiovascular: Normal rate, regular rhythm, normal heart sounds and intact distal pulses.   Pulmonary/Chest: Effort normal and breath sounds normal. She exhibits tenderness.  Abdominal: Soft. Bowel sounds are normal.  Lymphadenopathy:    She has no cervical adenopathy.  Neurological: She is alert.  Skin: Skin is warm.  Nursing note and vitals reviewed.    UC Treatments / Results  Labs (all labs ordered are listed, but only abnormal results are displayed) Labs Reviewed - No data to display  EKG  EKG Interpretation None       Radiology No results found.  Procedures Procedures (including critical care time)  Medications Ordered in UC Medications - No data to display   Initial Impression / Assessment and Plan / UC Course  I have reviewed the triage vital signs and the nursing notes.  Pertinent labs & imaging results that were available during my care of the patient were reviewed by me and considered in my medical decision making (see chart for details).  Clinical Course       Final Clinical Impressions(s) / UC Diagnoses   Final diagnoses:    Bronchitis    New Prescriptions Discharge Medication List as of 09/21/2016 10:48 AM    START taking these medications   Details  doxycycline (VIBRAMYCIN) 100 MG capsule Take 1 capsule (100 mg total) by mouth 2 (two) times daily., Starting Fri 09/21/2016, Print    HYDROcodone-homatropine (HYCODAN) 5-1.5 MG/5ML syrup Take 5 mLs by mouth every 6 (six) hours as needed for cough.  Take 5ml every 6hrs for cough, Starting Fri 09/21/2016, Print    ipratropium (ATROVENT) 0.06 % nasal spray Place 2 sprays into both nostrils 4 (four) times daily., Starting Fri 09/21/2016, Print         Linna HoffJames D Kindl, MD 09/27/16 [phone]

## 2016-09-21 NOTE — Discharge Instructions (Signed)
Take all of medicine, drink lots of fluids, no more smoking, see your doctor if further problems  °

## 2017-06-30 ENCOUNTER — Encounter (HOSPITAL_COMMUNITY): Payer: Self-pay | Admitting: Emergency Medicine

## 2017-06-30 ENCOUNTER — Emergency Department (HOSPITAL_COMMUNITY): Payer: Self-pay

## 2017-06-30 ENCOUNTER — Emergency Department (HOSPITAL_COMMUNITY)
Admission: EM | Admit: 2017-06-30 | Discharge: 2017-06-30 | Disposition: A | Discharge status: Home / Self Care | Payer: Self-pay | Attending: Emergency Medicine | Admitting: Emergency Medicine

## 2017-06-30 DIAGNOSIS — F17210 Nicotine dependence, cigarettes, uncomplicated: Secondary | ICD-10-CM | POA: Insufficient documentation

## 2017-06-30 DIAGNOSIS — R0789 Other chest pain: Secondary | ICD-10-CM | POA: Insufficient documentation

## 2017-06-30 LAB — CBC
HCT: 37.1 % (ref 36.0–46.0)
Hemoglobin: 12.4 g/dL (ref 12.0–15.0)
MCH: 29 pg (ref 26.0–34.0)
MCHC: 33.4 g/dL (ref 30.0–36.0)
MCV: 86.7 fL (ref 78.0–100.0)
PLATELETS: 265 10*3/uL (ref 150–400)
RBC: 4.28 MIL/uL (ref 3.87–5.11)
RDW: 14.9 % (ref 11.5–15.5)
WBC: 10.6 10*3/uL — AB (ref 4.0–10.5)

## 2017-06-30 LAB — BASIC METABOLIC PANEL
Anion gap: 8 (ref 5–15)
BUN: <5 mg/dL — ABNORMAL LOW (ref 6–20)
CALCIUM: 9.3 mg/dL (ref 8.9–10.3)
CHLORIDE: 98 mmol/L — AB (ref 101–111)
CO2: 26 mmol/L (ref 22–32)
Creatinine, Ser: 0.72 mg/dL (ref 0.44–1.00)
GFR calc Af Amer: >60 mL/min (ref >60)
GFR calc non Af Amer: >60 mL/min (ref >60)
Glucose, Bld: 112 mg/dL — ABNORMAL HIGH (ref 65–99)
Potassium: 3.5 mmol/L (ref 3.5–5.1)
Sodium: 132 mmol/L — ABNORMAL LOW (ref 135–145)

## 2017-06-30 LAB — D-DIMER, QUANTITATIVE (NOT AT ARMC): D DIMER QUANT: 0.74 ug{FEU}/mL — AB (ref 0.00–0.50)

## 2017-06-30 LAB — I-STAT BETA HCG BLOOD, ED (MC, WL, AP ONLY)

## 2017-06-30 LAB — I-STAT TROPONIN, ED: Troponin i, poc: 0 ng/mL (ref 0.00–0.08)

## 2017-06-30 MED ORDER — GI COCKTAIL ~~LOC~~
30.0000 mL | Freq: Once | ORAL | Status: AC
  Administered 2017-06-30: 30 mL via ORAL
  Filled 2017-06-30: qty 30

## 2017-06-30 MED ORDER — SODIUM CHLORIDE 0.9 % IV BOLUS (SEPSIS)
1000.0000 mL | Freq: Once | INTRAVENOUS | Status: AC
  Administered 2017-06-30: 1000 mL via INTRAVENOUS

## 2017-06-30 MED ORDER — ALUM & MAG HYDROXIDE-SIMETH 200-200-20 MG/5ML PO SUSP: 30.0000 mL | Freq: Four times a day (QID) | ORAL | 0 refills | Status: DC | PRN

## 2017-06-30 MED ORDER — KETOROLAC TROMETHAMINE 15 MG/ML IJ SOLN
15.0000 mg | Freq: Once | INTRAMUSCULAR | Status: AC
  Administered 2017-06-30: 15 mg via INTRAVENOUS
  Filled 2017-06-30: qty 1

## 2017-06-30 MED ORDER — IOPAMIDOL (ISOVUE-370) INJECTION 76%
INTRAVENOUS | Status: AC
  Administered 2017-06-30: 100 mL via INTRAVENOUS
  Filled 2017-06-30: qty 100

## 2017-06-30 NOTE — ED Notes (Signed)
ED Provider at bedside. 

## 2017-06-30 NOTE — ED Notes (Signed)
Patient transported to CT 

## 2017-06-30 NOTE — ED Provider Notes (Signed)
[facility]-EMERGENCY DEPT Provider Note   CSN: [id number] Arrival date & time: 06/30/17  1638     History   Chief Complaint Chief Complaint  Patient presents with  . Chest Pain    HPI Diana Huynh is a 36 y.o. female.  The history is provided by the patient.  Chest Pain   Chronicity: similar to prior chest wall muscle spasm. The current episode started yesterday. The problem occurs constantly. The problem has been gradually worsening. The pain is present in the substernal region. The pain is moderate. The quality of the pain is described as pressure-like and sharp. The pain radiates to the mid back. The symptoms are aggravated by certain positions and deep breathing (worse with lying down). Associated symptoms include back pain, cough (hacky cough today) and shortness of breath. Pertinent negatives include no fever, no hemoptysis, no leg pain, no lower extremity edema, no malaise/fatigue, no nausea and no vomiting. She has tried antacids for the symptoms. The treatment provided no relief.  Pertinent negatives for past medical history include no CAD, no cancer, no diabetes, no DVT, no hyperlipidemia, no hypertension, no MI, no PE and no strokes.   Denies any recent infections or h/o autoimmune disorder.  Reports that pain started after eating at a buffet.   History reviewed. No pertinent past medical history.  There are no active problems to display for this patient.   Past Surgical History:  Procedure Laterality Date  . ANKLE SURGERY    . APPENDECTOMY    . CESAREAN SECTION      OB History    No data available       Home Medications    Prior to Admission medications   Medication Sig Start Date End Date Taking? Authorizing Provider  famotidine (PEPCID) 20 MG tablet Take 1 tablet (20 mg total) by mouth 2 (two) times daily. Patient taking differently: Take 20 mg by mouth 2 (two) times daily as needed for heartburn or indigestion.  08/20/12  Yes Dammen, Theron Arista, PA-C    HYDROcodone-homatropine (HYCODAN) 5-1.5 MG/5ML syrup Take 5 mLs by mouth every 6 (six) hours as needed for cough. Take 5ml every 6hrs for cough 09/21/16  Yes Kindl, Quita Skye, MD  omeprazole (PRILOSEC) 20 MG capsule Take 2 capsules (40 mg total) by mouth daily. Patient taking differently: Take 40 mg by mouth daily as needed (acid reflux).  08/20/12  Yes Dammen, Theron Arista, PA-C  ranitidine (ZANTAC) 150 MG tablet Take 150 mg by mouth 2 (two) times daily as needed for heartburn.   Yes [provider]  alum & mag hydroxide-simeth (MAALOX REGULAR STRENGTH) 200-200-20 MG/5ML suspension Take 30 mLs by mouth every 6 (six) hours as needed for indigestion or heartburn. 06/30/17   Nira Conn, MD    Family History No family history on file.  Social History Social History  Substance Use Topics  . Smoking status: Current Every Day Smoker    Packs/day: 0.25  . Smokeless tobacco: Never Used  . Alcohol use Yes     Comment: 1 per week     Allergies   Patient has no known allergies.   Review of Systems Review of Systems  Constitutional: Negative for fever and malaise/fatigue.  Respiratory: Positive for cough (hacky cough today) and shortness of breath. Negative for hemoptysis.   Cardiovascular: Positive for chest pain.  Gastrointestinal: Negative for nausea and vomiting.  Musculoskeletal: Positive for back pain.  All other systems are reviewed and are negative for acute change except as noted in  the HPI    Physical Exam Updated Vital Signs BP 129/78   Pulse 93   Temp 98.3 F (36.8 C) (Oral)   Resp (!) 22   Ht  (1.549 m)   Wt 63.5 kg (140 lb)   LMP 06/02/2017   SpO2 100%   BMI 26.45 kg/m   Physical Exam  Constitutional: She is oriented to person, place, and time. She appears well-developed and well-nourished. No distress.  HENT:  Head: Normocephalic and atraumatic.  Nose: Nose normal.  Eyes: Pupils are equal, round, and reactive to light. Conjunctivae and EOM  are normal. Right eye exhibits no discharge. Left eye exhibits no discharge. No scleral icterus.  Neck: Normal range of motion. Neck supple.  Cardiovascular: Normal rate and regular rhythm.  Exam reveals no gallop and no friction rub.   No murmur heard. Pulmonary/Chest: Effort normal and breath sounds normal. No stridor. No respiratory distress. She has no rales. She exhibits no tenderness.  Abdominal: Soft. She exhibits no distension. There is no tenderness. There is no rigidity, no rebound, no guarding and no CVA tenderness.  Musculoskeletal: She exhibits no edema or tenderness.  Neurological: She is alert and oriented to person, place, and time.  Skin: Skin is warm and dry. No rash noted. She is not diaphoretic. No erythema.  Psychiatric: She has a normal mood and affect.  Vitals reviewed.    ED Treatments / Results  Labs (all labs ordered are listed, but only abnormal results are displayed) Labs Reviewed  BASIC METABOLIC PANEL - Abnormal; Notable for the following:       Result Value   Sodium 132 (*)    Chloride 98 (*)    Glucose, Bld 112 (*)    BUN <5 (*)    All other components within normal limits  CBC - Abnormal; Notable for the following:    WBC 10.6 (*)    All other components within normal limits  D-DIMER, QUANTITATIVE (NOT AT [facility]) - Abnormal; Notable for the following:    D-Dimer, Quant 0.74 (*)    All other components within normal limits  I-STAT TROPONIN, ED  I-STAT BETA HCG BLOOD, ED ([facility], [facility], [facility] ONLY)    EKG  EKG Interpretation  Date/Time:  Sunday June 30 2017 16:41:54 EDT Ventricular Rate:  98 PR Interval:  168 QRS Duration: 86 QT Interval:  344 QTC Calculation: 439 R Axis:   78 Text Interpretation:  Normal sinus rhythm Right atrial enlargement Borderline ECG No significant change since last tracing Confirmed by Drema Pry [phone]) on 06/30/2017 8:33:46 PM       Radiology Dg Chest 2 View  Result Date: 06/30/2017 CLINICAL DATA:  Chest pain  EXAM: CHEST  2 VIEW COMPARISON:  Chest radiograph 04/19/2015 FINDINGS: The heart size and mediastinal contours are within normal limits. Both lungs are clear. Opacity of the lateral left lung base may be a nipple shadow. The visualized skeletal structures are unremarkable. IMPRESSION: No active cardiopulmonary disease. Electronically Signed   By: Deatra Robinson M.D.   On: 06/30/2017 17:39   Ct Angio Chest Pe W And/or Wo Contrast  Result Date: 06/30/2017 CLINICAL DATA:  Central chest pressure radiating to the upper back since last night. Shortness of breath and nausea. Elevated D-dimer. EXAM: CT ANGIOGRAPHY CHEST WITH CONTRAST TECHNIQUE: Multidetector CT imaging of the chest was performed using the standard protocol during bolus administration of intravenous contrast. Multiplanar CT image reconstructions and MIPs were obtained to evaluate the vascular anatomy. CONTRAST:  59 mL  Isovue 370 COMPARISON:  None. FINDINGS: Cardiovascular: Satisfactory opacification of the pulmonary arteries to the segmental level. No evidence of pulmonary embolism. Normal heart size. No pericardial effusion. Normal caliber thoracic aorta. No aortic dissection. Mediastinum/Nodes: No enlarged mediastinal, hilar, or axillary lymph nodes. Thyroid gland, trachea, and esophagus demonstrate no significant findings. Lungs/Pleura: There is atelectasis in both lung bases. No pleural effusions. No pneumothorax. Airways are patent. Upper Abdomen: No acute abnormality. Musculoskeletal: No chest wall abnormality. No acute or significant osseous findings. Review of the MIP images confirms the above findings. IMPRESSION: No evidence of significant pulmonary embolus. Atelectasis in both lung bases. Electronically Signed   By: Burman Nieves M.D.   On: 06/30/2017 22:55    Procedures Procedures (including critical care time)    EMERGENCY DEPARTMENT [place] CARDIAC EXAM "Study: Limited Ultrasound of the Heart and Pericardium"  INDICATIONS:Chest  pain Multiple views of the heart and pericardium were obtained in real-time with a multi-frequency probe.  PERFORMED ZO:XWRUEA IMAGES ARCHIVED?: Yes LIMITATIONS:  None VIEWS USED: Subcostal 4 chamber, Parasternal long axis, Parasternal short axis and Apical 4 chamber  INTERPRETATION: Cardiac activity present, Pericardial effusioin absent and Normal contractility   Medications Ordered in ED Medications  gi cocktail (Maalox,Lidocaine,Donnatal) (30 mLs Oral Given 06/30/17 2129)  sodium chloride 0.9 % bolus 1,000 mL (1,000 mLs Intravenous New Bag/Given 06/30/17 2129)  ketorolac (TORADOL) 15 MG/ML injection 15 mg (15 mg Intravenous Given 06/30/17 2129)  iopamidol (ISOVUE-370) 76 % injection (100 mLs Intravenous Contrast Given 06/30/17 2156)     Initial Impression / Assessment and Plan / ED Course  I have reviewed the triage vital signs and the nursing notes.  Pertinent labs & imaging results that were available during my care of the patient were reviewed by me and considered in my medical decision making (see chart for details).     Atypical chest pain. Positional and pleuritic in nature. Similar to prior muscles strain or heartburn. EKG without acute ischemic changes or evidence of pericarditis. Troponin in triage was negative. Given the duration of her symptomatology, I feel that a single troponin more than 24 hours after onset of symptoms is sufficient to rule out ACS.  Low pretest probability for pulmonary embolism. D-dimer obtained and was positive. CTA negative for pulmonary embolism or other pulmonary processes.  Bedside ultrasound without evidence of pericardial effusion.  Presentation not classic for aortic dissection or esophageal perforation.  She does not have any chest/thoracic wall tenderness.  Given Toradol and GI cocktail which has significant improvement in her symptomatology.  The patient appears reasonably screened and/or stabilized for discharge and I doubt any other  medical condition or other [facility] requiring further screening, evaluation, or treatment in the ED at this time prior to discharge.  The patient is safe for discharge with strict return precautions.   Final Clinical Impressions(s) / ED Diagnoses   Final diagnoses:  Atypical chest pain   Disposition: Discharge  Condition: Good  I have discussed the results, Dx and Tx plan with the patient who expressed understanding and agree(s) with the plan. Discharge instructions discussed at great length. The patient was given strict return precautions who verbalized understanding of the instructions. No further questions at time of discharge.    New Prescriptions   ALUM & MAG HYDROXIDE-SIMETH (MAALOX REGULAR STRENGTH) 200-200-20 MG/5ML SUSPENSION    Take 30 mLs by mouth every 6 (six) hours as needed for indigestion or heartburn.    Follow Up: Primary care provider  Schedule an appointment as soon as  possible for a visit  As needed      Nira Conn, MD 06/30/17 2327

## 2017-06-30 NOTE — ED Triage Notes (Signed)
C/o pressure to center of chest that radiates to upper back since last night with sob and mild nausea.  Took Pepcid and Zantac without relief.

## 2017-12-26 ENCOUNTER — Encounter (HOSPITAL_COMMUNITY): Payer: Self-pay | Admitting: Emergency Medicine

## 2017-12-26 ENCOUNTER — Other Ambulatory Visit: Payer: Self-pay

## 2017-12-26 ENCOUNTER — Emergency Department (HOSPITAL_COMMUNITY): Payer: Medicaid Other

## 2017-12-26 DIAGNOSIS — I4581 Long QT syndrome: Secondary | ICD-10-CM | POA: Diagnosis present

## 2017-12-26 DIAGNOSIS — Z79899 Other long term (current) drug therapy: Secondary | ICD-10-CM

## 2017-12-26 DIAGNOSIS — J069 Acute upper respiratory infection, unspecified: Secondary | ICD-10-CM | POA: Diagnosis present

## 2017-12-26 DIAGNOSIS — M3212 Pericarditis in systemic lupus erythematosus: Principal | ICD-10-CM | POA: Diagnosis present

## 2017-12-26 DIAGNOSIS — K219 Gastro-esophageal reflux disease without esophagitis: Secondary | ICD-10-CM | POA: Diagnosis present

## 2017-12-26 DIAGNOSIS — J90 Pleural effusion, not elsewhere classified: Secondary | ICD-10-CM | POA: Diagnosis present

## 2017-12-26 DIAGNOSIS — I313 Pericardial effusion (noninflammatory): Secondary | ICD-10-CM | POA: Diagnosis present

## 2017-12-26 DIAGNOSIS — F17210 Nicotine dependence, cigarettes, uncomplicated: Secondary | ICD-10-CM | POA: Diagnosis present

## 2017-12-26 LAB — BASIC METABOLIC PANEL
ANION GAP: 11 (ref 5–15)
BUN: 5 mg/dL — ABNORMAL LOW (ref 6–20)
CALCIUM: 8.8 mg/dL — AB (ref 8.9–10.3)
CO2: 22 mmol/L (ref 22–32)
Chloride: 99 mmol/L — ABNORMAL LOW (ref 101–111)
Creatinine, Ser: 0.69 mg/dL (ref 0.44–1.00)
GLUCOSE: 99 mg/dL (ref 65–99)
Potassium: 3.4 mmol/L — ABNORMAL LOW (ref 3.5–5.1)
Sodium: 132 mmol/L — ABNORMAL LOW (ref 135–145)

## 2017-12-26 LAB — CBC
HCT: 34 % — ABNORMAL LOW (ref 36.0–46.0)
Hemoglobin: 11.3 g/dL — ABNORMAL LOW (ref 12.0–15.0)
MCH: 28 pg (ref 26.0–34.0)
MCHC: 33.2 g/dL (ref 30.0–36.0)
MCV: 84.2 fL (ref 78.0–100.0)
Platelets: 376 10*3/uL (ref 150–400)
RBC: 4.04 MIL/uL (ref 3.87–5.11)
RDW: 14.1 % (ref 11.5–15.5)
WBC: 13 10*3/uL — ABNORMAL HIGH (ref 4.0–10.5)

## 2017-12-26 LAB — I-STAT BETA HCG BLOOD, ED (MC, WL, AP ONLY): I-stat hCG, quantitative: <5 m[IU]/mL (ref <5)

## 2017-12-26 LAB — I-STAT TROPONIN, ED: TROPONIN I, POC: 0 ng/mL (ref 0.00–0.08)

## 2017-12-26 MED ORDER — IBUPROFEN 400 MG PO TABS
400.0000 mg | ORAL_TABLET | Freq: Once | ORAL | Status: AC | PRN
  Administered 2017-12-26: 400 mg via ORAL
  Filled 2017-12-26: qty 1

## 2017-12-26 NOTE — ED Notes (Signed)
Family comes to [facility] saying patient has increasing pain. Assessed patient and will give ibuprofen per Triage orders.

## 2017-12-26 NOTE — ED Notes (Signed)
Unable to pull med from pyxis so will place this patient in room, get med, then [place].

## 2017-12-26 NOTE — ED Triage Notes (Signed)
Pt c/o chest pain and shortness of breath that started today. Pt has had a productive cough "for a while".

## 2017-12-26 NOTE — ED Notes (Signed)
Patient moved into hallway in computer for medication crossover in the pyxis.  Medication able to be pulled at this time.

## 2017-12-26 NOTE — ED Notes (Signed)
Unable to pull medication from pyxis.

## 2017-12-27 ENCOUNTER — Emergency Department (HOSPITAL_COMMUNITY): Payer: Medicaid Other

## 2017-12-27 ENCOUNTER — Inpatient Hospital Stay (HOSPITAL_COMMUNITY)
Admission: EM | Admit: 2017-12-27 | Discharge: 2017-12-31 | DRG: 546 | Disposition: A | Discharge status: Home / Self Care | Payer: Medicaid Other | Attending: Student in an Organized Health Care Education/Training Program | Admitting: Student in an Organized Health Care Education/Training Program

## 2017-12-27 ENCOUNTER — Emergency Department (HOSPITAL_BASED_OUTPATIENT_CLINIC_OR_DEPARTMENT_OTHER): Payer: Medicaid Other

## 2017-12-27 ENCOUNTER — Other Ambulatory Visit: Payer: Self-pay

## 2017-12-27 DIAGNOSIS — M3212 Pericarditis in systemic lupus erythematosus: Secondary | ICD-10-CM | POA: Diagnosis present

## 2017-12-27 DIAGNOSIS — J069 Acute upper respiratory infection, unspecified: Secondary | ICD-10-CM | POA: Diagnosis present

## 2017-12-27 DIAGNOSIS — I313 Pericardial effusion (noninflammatory): Secondary | ICD-10-CM | POA: Diagnosis present

## 2017-12-27 DIAGNOSIS — J90 Pleural effusion, not elsewhere classified: Secondary | ICD-10-CM | POA: Diagnosis present

## 2017-12-27 DIAGNOSIS — I301 Infective pericarditis: Secondary | ICD-10-CM

## 2017-12-27 DIAGNOSIS — M329 Systemic lupus erythematosus, unspecified: Secondary | ICD-10-CM | POA: Insufficient documentation

## 2017-12-27 DIAGNOSIS — K219 Gastro-esophageal reflux disease without esophagitis: Secondary | ICD-10-CM | POA: Diagnosis present

## 2017-12-27 DIAGNOSIS — I4581 Long QT syndrome: Secondary | ICD-10-CM | POA: Diagnosis present

## 2017-12-27 DIAGNOSIS — I309 Acute pericarditis, unspecified: Secondary | ICD-10-CM

## 2017-12-27 DIAGNOSIS — I3139 Other pericardial effusion (noninflammatory): Secondary | ICD-10-CM

## 2017-12-27 DIAGNOSIS — F17210 Nicotine dependence, cigarettes, uncomplicated: Secondary | ICD-10-CM | POA: Diagnosis present

## 2017-12-27 DIAGNOSIS — Z79899 Other long term (current) drug therapy: Secondary | ICD-10-CM | POA: Diagnosis not present

## 2017-12-27 DIAGNOSIS — R091 Pleurisy: Secondary | ICD-10-CM

## 2017-12-27 HISTORY — DX: Bronchitis, not specified as acute or chronic: J40

## 2017-12-27 LAB — CBC
HEMATOCRIT: 33.9 % — AB (ref 36.0–46.0)
Hemoglobin: 11.2 g/dL — ABNORMAL LOW (ref 12.0–15.0)
MCH: 28.1 pg (ref 26.0–34.0)
MCHC: 33 g/dL (ref 30.0–36.0)
MCV: 85.2 fL (ref 78.0–100.0)
Platelets: 379 10*3/uL (ref 150–400)
RBC: 3.98 MIL/uL (ref 3.87–5.11)
RDW: 14.2 % (ref 11.5–15.5)
WBC: 16.1 10*3/uL — ABNORMAL HIGH (ref 4.0–10.5)

## 2017-12-27 LAB — TROPONIN I
Troponin I: <0.03 ng/mL (ref <0.03)
Troponin I: <0.03 ng/mL (ref <0.03)

## 2017-12-27 LAB — D-DIMER, QUANTITATIVE (NOT AT ARMC): D DIMER QUANT: 4.45 ug{FEU}/mL — AB (ref 0.00–0.50)

## 2017-12-27 LAB — RESPIRATORY PANEL BY PCR
Adenovirus: NOT DETECTED
BORDETELLA PERTUSSIS-RVPCR: NOT DETECTED
CORONAVIRUS 229E-RVPPCR: NOT DETECTED
CORONAVIRUS OC43-RVPPCR: NOT DETECTED
Chlamydophila pneumoniae: NOT DETECTED
Coronavirus HKU1: NOT DETECTED
Coronavirus NL63: NOT DETECTED
INFLUENZA A-RVPPCR: NOT DETECTED
INFLUENZA B-RVPPCR: NOT DETECTED
METAPNEUMOVIRUS-RVPPCR: NOT DETECTED
MYCOPLASMA PNEUMONIAE-RVPPCR: NOT DETECTED
PARAINFLUENZA VIRUS 2-RVPPCR: NOT DETECTED
PARAINFLUENZA VIRUS 4-RVPPCR: NOT DETECTED
Parainfluenza Virus 1: NOT DETECTED
Parainfluenza Virus 3: NOT DETECTED
RESPIRATORY SYNCYTIAL VIRUS-RVPPCR: NOT DETECTED
Rhinovirus / Enterovirus: NOT DETECTED

## 2017-12-27 LAB — LIPID PANEL
CHOL/HDL RATIO: 3.8 ratio
Cholesterol: 119 mg/dL (ref 0–200)
HDL: 31 mg/dL — AB (ref >40)
LDL CALC: 80 mg/dL (ref 0–99)
TRIGLYCERIDES: 41 mg/dL (ref <150)
VLDL: 8 mg/dL (ref 0–40)

## 2017-12-27 LAB — I-STAT TROPONIN, ED: Troponin i, poc: 0 ng/mL (ref 0.00–0.08)

## 2017-12-27 LAB — ECHOCARDIOGRAM COMPLETE
HEIGHTINCHES: 61 in
Weight: 2384 oz

## 2017-12-27 LAB — CREATININE, SERUM
CREATININE: 0.8 mg/dL (ref 0.44–1.00)
GFR calc Af Amer: >60 mL/min (ref >60)
GFR calc non Af Amer: >60 mL/min (ref >60)

## 2017-12-27 LAB — MRSA PCR SCREENING: MRSA by PCR: NEGATIVE

## 2017-12-27 LAB — HEMOGLOBIN A1C
Hgb A1c MFr Bld: 5.3 % (ref 4.8–5.6)
Mean Plasma Glucose: 105.41 mg/dL

## 2017-12-27 LAB — SEDIMENTATION RATE: SED RATE: 57 mm/h — AB (ref 0–22)

## 2017-12-27 LAB — BRAIN NATRIURETIC PEPTIDE: B Natriuretic Peptide: 28.6 pg/mL (ref 0.0–100.0)

## 2017-12-27 LAB — C-REACTIVE PROTEIN: CRP: 8.6 mg/dL — AB (ref <1.0)

## 2017-12-27 LAB — RAPID STREP SCREEN (MED CTR MEBANE ONLY): Streptococcus, Group A Screen (Direct): NEGATIVE

## 2017-12-27 LAB — TSH: TSH: 1.582 u[IU]/mL (ref 0.350–4.500)

## 2017-12-27 MED ORDER — ACETAMINOPHEN 650 MG RE SUPP: 650.0000 mg | Freq: Four times a day (QID) | RECTAL | Status: DC | PRN

## 2017-12-27 MED ORDER — ASPIRIN EC 81 MG PO TBEC
81.0000 mg | DELAYED_RELEASE_TABLET | Freq: Every day | ORAL | Status: DC
  Administered 2017-12-27 – 2017-12-29 (×3): 81 mg via ORAL
  Filled 2017-12-27 (×3): qty 1

## 2017-12-27 MED ORDER — TRAMADOL HCL 50 MG PO TABS
50.0000 mg | ORAL_TABLET | Freq: Four times a day (QID) | ORAL | Status: DC | PRN
  Administered 2017-12-27: 50 mg via ORAL
  Filled 2017-12-27: qty 1

## 2017-12-27 MED ORDER — ACETAMINOPHEN 325 MG PO TABS: 650.0000 mg | ORAL_TABLET | Freq: Four times a day (QID) | ORAL | Status: DC | PRN

## 2017-12-27 MED ORDER — ONDANSETRON HCL 4 MG/2ML IJ SOLN: 4.0000 mg | Freq: Four times a day (QID) | INTRAMUSCULAR | Status: DC | PRN

## 2017-12-27 MED ORDER — MORPHINE SULFATE (PF) 4 MG/ML IV SOLN
4.0000 mg | Freq: Once | INTRAVENOUS | Status: AC
  Administered 2017-12-27: 4 mg via INTRAVENOUS
  Filled 2017-12-27: qty 1

## 2017-12-27 MED ORDER — KETOROLAC TROMETHAMINE 30 MG/ML IJ SOLN
30.0000 mg | Freq: Four times a day (QID) | INTRAMUSCULAR | Status: DC | PRN
  Administered 2017-12-27 – 2017-12-30 (×9): 30 mg via INTRAVENOUS
  Filled 2017-12-27 (×9): qty 1

## 2017-12-27 MED ORDER — RAMELTEON 8 MG PO TABS
8.0000 mg | ORAL_TABLET | Freq: Every day | ORAL | Status: DC
  Administered 2017-12-28 – 2017-12-30 (×4): 8 mg via ORAL
  Filled 2017-12-27 (×4): qty 1

## 2017-12-27 MED ORDER — SENNOSIDES-DOCUSATE SODIUM 8.6-50 MG PO TABS: 1.0000 | ORAL_TABLET | Freq: Every evening | ORAL | Status: DC | PRN

## 2017-12-27 MED ORDER — IOPAMIDOL (ISOVUE-370) INJECTION 76%
INTRAVENOUS | Status: AC
  Administered 2017-12-27: 100 mL
  Filled 2017-12-27: qty 100

## 2017-12-27 MED ORDER — ACETAMINOPHEN 325 MG PO TABS
650.0000 mg | ORAL_TABLET | Freq: Once | ORAL | Status: AC
  Administered 2017-12-27: 650 mg via ORAL
  Filled 2017-12-27: qty 2

## 2017-12-27 MED ORDER — ALUM & MAG HYDROXIDE-SIMETH 200-200-20 MG/5ML PO SUSP
30.0000 mL | Freq: Four times a day (QID) | ORAL | Status: DC | PRN
  Administered 2017-12-28: 30 mL via ORAL
  Filled 2017-12-27: qty 30

## 2017-12-27 MED ORDER — SODIUM CHLORIDE 0.9 % IV SOLN
Freq: Once | INTRAVENOUS | Status: AC
Start: 2017-12-27 — End: 2017-12-27
  Administered 2017-12-27: 14:00:00 via INTRAVENOUS

## 2017-12-27 MED ORDER — MORPHINE SULFATE (PF) 4 MG/ML IV SOLN
4.0000 mg | INTRAVENOUS | Status: DC | PRN
  Administered 2017-12-27 (×2): 4 mg via INTRAVENOUS
  Filled 2017-12-27 (×2): qty 1

## 2017-12-27 MED ORDER — KETOROLAC TROMETHAMINE 30 MG/ML IJ SOLN: 30.0000 mg | Freq: Once | INTRAMUSCULAR | Status: DC

## 2017-12-27 MED ORDER — ENOXAPARIN SODIUM 40 MG/0.4ML ~~LOC~~ SOLN
40.0000 mg | SUBCUTANEOUS | Status: DC
  Administered 2017-12-27 – 2017-12-30 (×4): 40 mg via SUBCUTANEOUS
  Filled 2017-12-27 (×4): qty 0.4

## 2017-12-27 MED ORDER — COLCHICINE 0.6 MG PO TABS
0.6000 mg | ORAL_TABLET | Freq: Two times a day (BID) | ORAL | Status: DC
  Administered 2017-12-27 – 2017-12-31 (×8): 0.6 mg via ORAL
  Filled 2017-12-27 (×8): qty 1

## 2017-12-27 MED ORDER — KETOROLAC TROMETHAMINE 30 MG/ML IJ SOLN
30.0000 mg | Freq: Once | INTRAMUSCULAR | Status: AC
  Administered 2017-12-27: 30 mg via INTRAVENOUS
  Filled 2017-12-27: qty 1

## 2017-12-27 MED ORDER — SODIUM CHLORIDE 0.9 % IV SOLN
INTRAVENOUS | Status: AC
  Administered 2017-12-28: 01:00:00 via INTRAVENOUS

## 2017-12-27 MED ORDER — ONDANSETRON HCL 4 MG PO TABS: 4.0000 mg | ORAL_TABLET | Freq: Four times a day (QID) | ORAL | Status: DC | PRN

## 2017-12-27 NOTE — Consult Note (Addendum)
Cardiology Consult Note:   Patient ID: Diana Huynh; MRN: [id number]; DOB: 1981/03/11   Admission date: 12/27/2017  Primary Care Provider: System, Pcp Not In Primary Cardiologist: Kristeen Miss, MD  Primary Electrophysiologist:    Chief Complaint:  Chest pain  Patient Profile:   Diana Huynh is a 37 y.o. female with a history of recent viral illness reports to [facility] with chest pain. Cardiology was asked to consult for chest pain at the request of Dr. Donnald Garre.  History of Present Illness:   Diana Huynh has no prior cardiac history. She states that last week she developed sore throat and nasal congestion treated with OTC medications. On Tues, she felt achy all over her body, especially in her back and chest. She took more OTC medication Tues and Wed night. Last evening, she developed more sever pain in her back and chest. The chest pain has been constant and radiates to the middle of her upper back. Nothing makes the pain better. The pain is worse with deep inspiration and with palpation. She was given morphine which only minimally reduced her pain.  Her only risk factor for ACS is current every day tobacco use. She does not have a history of DM. She does not have a family history of cardiac issues or sudden cardiac death, but her sister has a defibrillator (unknown reason).    Troponin x 2 negative. EKG with some nonspecific ST changes, appears similar to prior. Sed rate elevated. CRP pending.  D-dimer elevated 4.45, CTA negative for PE. BNP is not elevated.   Past Medical History:  Diagnosis Date  . Bronchitis     Past Surgical History:  Procedure Laterality Date  . ANKLE SURGERY    . APPENDECTOMY    . CESAREAN SECTION       Medications Prior to Admission: Prior to Admission medications   Medication Sig Start Date End Date Taking? Authorizing Provider  alum & mag hydroxide-simeth (MAALOX REGULAR STRENGTH) 200-200-20 MG/5ML suspension Take 30 mLs by mouth every 6 (six) hours as  needed for indigestion or heartburn. 06/30/17  Yes Cardama, Amadeo Garnet, MD  famotidine (PEPCID) 20 MG tablet Take 1 tablet (20 mg total) by mouth 2 (two) times daily. Patient taking differently: Take 20 mg by mouth 2 (two) times daily as needed for heartburn or indigestion.  08/20/12  Yes Dammen, Theron Arista, PA-C  omeprazole (PRILOSEC) 20 MG capsule Take 2 capsules (40 mg total) by mouth daily. Patient taking differently: Take 40 mg by mouth daily as needed (acid reflux).  08/20/12  Yes Dammen, Theron Arista, PA-C  ranitidine (ZANTAC) 150 MG tablet Take 150 mg by mouth 2 (two) times daily as needed for heartburn.   Yes [provider]     Allergies:   No Known Allergies  Social History:   Social History   Socioeconomic History  . Marital status: Single    Spouse name: Not on file  . Number of children: Not on file  . Years of education: Not on file  . Highest education level: Not on file  Occupational History  . Not on file  Social Needs  . Financial resource strain: Not on file  . Food insecurity:    Worry: Not on file    Inability: Not on file  . Transportation needs:    Medical: Not on file    Non-medical: Not on file  Tobacco Use  . Smoking status: Current Every Day Smoker    Packs/day: 0.25  . Smokeless tobacco: Never Used  Substance  and Sexual Activity  . Alcohol use: Yes    Comment: 1 per week  . Drug use: No  . Sexual activity: Yes  Lifestyle  . Physical activity:    Days per week: Not on file    Minutes per session: Not on file  . Stress: Not on file  Relationships  . Social connections:    Talks on phone: Not on file    Gets together: Not on file    Attends religious service: Not on file    Active member of club or organization: Not on file    Attends meetings of clubs or organizations: Not on file    Relationship status: Not on file  . Intimate partner violence:    Fear of current or ex partner: Not on file    Emotionally abused: Not on file     Physically abused: Not on file    Forced sexual activity: Not on file  Other Topics Concern  . Not on file  Social History Narrative  . Not on file    Family History:   The patient's sister has an ICD.  ROS:  Please see the history of present illness.  All other ROS reviewed and negative.     Physical Exam/Data:   Vitals:   12/27/17 0651 12/27/17 0815 12/27/17 1000 12/27/17 1100  BP:   123/73 115/62  Pulse:  87 79 98  Resp:  17 17 (!) 22  Temp:      TempSrc:      SpO2:  100% 100% 100%  Weight: 149 lb (67.6 kg)     Height: 5\' 1"  (1.549 m)      No intake or output data in the 24 hours ending 12/27/17 1248 Filed Weights   12/27/17 0651  Weight: 149 lb (67.6 kg)   Body mass index is 28.15 kg/m.  General:  Well nourished, well developed, in no acute distress, but looks uncomfortable HEENT: normal Neck: no JVD Vascular: No carotid bruits; FA pulses 2+ bilaterally without bruits  Cardiac:  normal S1, S2; RRR; questionable rub left lateral   Lungs:  clear to auscultation bilaterally, no wheezing, rhonchi or rales  Abd: soft, nontender, no hepatomegaly  Ext: no edema Musculoskeletal:  No deformities, BUE and BLE strength normal and equal Skin: warm and dry  Neuro:  CNs 2-12 intact, no focal abnormalities noted Psych:  Normal affect    EKG:  The ECG that was done was personally reviewed and demonstrates nonspecific ST changes that do not appear to be new  Relevant CV Studies:  Echo 12/27/17: Study Conclusions - Left ventricle: The cavity size was normal. Wall thickness was   normal. Systolic function was normal. The estimated ejection   fraction was in the range of 60% to 65%. Wall motion was normal;   there were no regional wall motion abnormalities. Left   ventricular diastolic function parameters were normal. - Mitral valve: Mildly thickened leaflets . There was trivial   regurgitation. - Left atrium: The atrium was normal in size. - Pericardium, extracardiac: A  small pericardial effusion was   identified. Features were not consistent with tamponade   physiology.  Impressions: - LVEF 60-65%, normal wall thickness, normal wall motion, normal   diastolic function, trivial MR, normal LA size, small pericardial   effusion without tamponade features.  Laboratory Data:  Chemistry Recent Labs  Lab 12/26/17 2010  NA 132*  K 3.4*  CL 99*  CO2 22  GLUCOSE 99  BUN 5*  CREATININE 0.69  CALCIUM 8.8*  GFRNONAA >60  GFRAA >60  ANIONGAP 11    No results for input(s): PROT, ALBUMIN, AST, ALT, ALKPHOS, BILITOT in the last 168 hours. Hematology Recent Labs  Lab 12/26/17 2010  WBC 13.0*  RBC 4.04  HGB 11.3*  HCT 34.0*  MCV 84.2  MCH 28.0  MCHC 33.2  RDW 14.1  PLT 376   Cardiac EnzymesNo results for input(s): TROPONINI in the last 168 hours.  Recent Labs  Lab 12/26/17 2029 12/27/17 0828  TROPIPOC 0.00 0.00    BNP Recent Labs  Lab 12/26/17 2010  BNP 28.6    DDimer  Recent Labs  Lab 12/27/17 0819  DDIMER 4.45*    Radiology/Studies:  Dg Chest 2 View  Result Date: 12/26/2017 CLINICAL DATA:  Chest pain and shortness of breath beginning this afternoon. EXAM: CHEST - 2 VIEW COMPARISON:  Chest radiograph June 30, 2017 FINDINGS: Cardiac silhouette is moderately enlarged. Pulmonary vascular congestion without pleural effusion or focal consolidation. No pneumothorax. Soft tissue planes included osseous structures are normal. IMPRESSION: Cardiomegaly and pulmonary vascular congestion. Electronically Signed   By: Awilda Metroourtnay  Bloomer M.D.   On: 12/26/2017 20:35   Ct Angio Chest Pe W And/or Wo Contrast  Result Date: 12/27/2017 CLINICAL DATA:  Shortness of breath and chest pain EXAM: CT ANGIOGRAPHY CHEST WITH CONTRAST TECHNIQUE: Multidetector CT imaging of the chest was performed using the standard protocol during bolus administration of intravenous contrast. Multiplanar CT image reconstructions and MIPs were obtained to evaluate the  vascular anatomy. CONTRAST:  100mL ISOVUE-370 IOPAMIDOL (ISOVUE-370) INJECTION 76% COMPARISON:  06/30/2017 FINDINGS: Cardiovascular: Borderline heart size. Small to moderate pericardial effusion. Detection of serosal enhancement is limited by CTA timing, but there is suggestion of serosal thickening which would imply pericarditis. Negative for pulmonary embolism. Negative thoracic aorta. Mediastinum/Nodes: Negative for adenopathy or mass. Lungs/Pleura: Mild atelectasis and trace left pleural effusion. Upper Abdomen: Negative Musculoskeletal: No acute or aggressive finding Review of the MIP images confirms the above findings. IMPRESSION: 1. Small to moderate pericardial effusion. Although limited by CTA timing, suspect there is associated serosal thickening and underlying pericarditis. 2. Trace left pleural effusion, possibly from serositis. 3. Atelectasis. 4. Negative for pulmonary embolism. Electronically Signed   By: Marnee SpringJonathon  Watts M.D.   On: 12/27/2017 09:56    Assessment and Plan:   1. Chest pain, pericardial effusion - pt reports history of viral respiratory illness within the past 2 weeks - chest pain has been constant since yesterday, radiating to her back - troponin x 2 negative - EKG with diffuse nonspecific ST changes - Sed rate elevated - D-dimer elevated, but CTA negative for PE This chest pain that is constant in the setting of negative CE, elevated sed rate, and recent viral illness is concerning for viral pericarditis. Small pericardial effusion is noted on echocardiogram today. She does not have EKG changes reflective of pericarditis, and her elevated D-dimer is concerning. She is afebrile and blood cultures are pending. She is tachycardic today, with HR in the 100s. Morphine has not helped her pain.   Will trial colchicine to see if this improves her pain. Will start 0.6 mg BID.   2. Current smoker Encouraged cessation. She knows she needs to quit smoking.   Severity of  Illness: The appropriate patient status for this patient is INPATIENT. Inpatient status is judged to be reasonable and necessary in order to provide the required intensity of service to ensure the patient's safety. The patient's presenting symptoms, physical exam  findings, and initial radiographic and laboratory data in the context of their chronic comorbidities is felt to place them at high risk for further clinical deterioration. Furthermore, it is not anticipated that the patient will be medically stable for discharge from the hospital within 2 midnights of admission. The following factors support the patient status of inpatient.   " The patient's presenting symptoms include chest pain. " The worrisome physical exam findings include chest pain. " The initial radiographic and laboratory data are worrisome because of pericardial effusion " The chronic co-morbidities include smoking, viral illness   * I certify that at the point of admission it is my clinical judgment that the patient will require inpatient hospital care spanning beyond 2 midnights from the point of admission due to high intensity of service, high risk for further deterioration and high frequency of surveillance required.*    For questions or updates, please contact [facility] Please consult [email or web address] for contact info under Cardiology/STEMI.    Signed, Marcelino Duster, [place]  12/27/2017 12:48 PM    Attending Note:   The patient was seen and examined.  Agree with assessment and plan as noted above.  Changes made to the above note as needed.  Patient seen and independently examined with Bettina Gavia, PA .   We discussed all aspects of the encounter. I agree with the assessment and plan as stated above.  1.  Chest discomfort: The patient presents with pleuritic chest pain.  She clearly has a febrile illness.  Her temperature is 101 F.  She has an elevated white count.  She has had a cough and congestion for 1 week.   She is been cultured up.  Flu swabs have been obtained.  She certainly may have pleuritis or pericarditis.  We will see if colchicine helps.  There is no evidence of acute coronary syndrome.  Need to be admitted by internal medicine.  We will continue to follow her because she has been found to have some pericardial fluid.  There is no evidence of cardiac tamponade.  2.  Elevated d-dimer: Her d-dimer is fairly elevated.  CT angiogram did not reveal any evidence of pulmonary embolus.  She may need to be checked for DVT.  3.  Elevated sed rate    I have spent a total of 40 minutes with patient reviewing hospital  notes , telemetry, EKGs, labs and examining patient as well as establishing an assessment and plan that was discussed with the patient. > 50% of time was spent in direct patient care.    Vesta Mixer, Montez Hageman., MD, [facility] 12/27/2017, 3:22 PM [address]  [address] Office [phone] Pager [phone]

## 2017-12-27 NOTE — ED Provider Notes (Signed)
[facility] EMERGENCY DEPARTMENT Provider Note   CSN: [id number] Arrival date & time: 12/26/17  1933     History   Chief Complaint Chief Complaint  Patient presents with  . Chest Pain    HPI Diana Huynh is a 37 y.o. female.  HPI   37 year old female presenting for evaluation of chest pain.  Patient reports since 5 PM last night she has had pain to the left side of her chest.  She described pain as a sharp sensation, wraps around her left lower breast, pleuritic nature, reproducible pain with pushing, and rated as moderate in severity.  She endorsed having cold chills, shortness of breath, heart palpitation, and feeling dizzy during these episode.  She denies fever, headache, URI symptoms, productive cough, hemoptysis, back pain, arm pain, jaw pain, exertional chest pain, abdominal pain, focal numbness or weakness.  No prior history of thyroid disease.  Denies prior history of PE or DVT, no recent surgery, prolonged bed rest, leg swelling or calf pain.  Does report some family history of cardiac disease.  Patient admits to tobacco use approximately 3-4 cigarettes a day and occasional alcohol use.  Denies recreational drug use.  Incident happened when she left work.  She did mention having cold symptoms several days prior with body aches but that has since resolved.  Past Medical History:  Diagnosis Date  . Bronchitis     There are no active problems to display for this patient.   Past Surgical History:  Procedure Laterality Date  . ANKLE SURGERY    . APPENDECTOMY    . CESAREAN SECTION       OB History   None      Home Medications    Prior to Admission medications   Medication Sig Start Date End Date Taking? Authorizing Provider  alum & mag hydroxide-simeth (MAALOX REGULAR STRENGTH) 200-200-20 MG/5ML suspension Take 30 mLs by mouth every 6 (six) hours as needed for indigestion or heartburn. 06/30/17  Yes Cardama, Amadeo Garnet, MD  famotidine (PEPCID)  20 MG tablet Take 1 tablet (20 mg total) by mouth 2 (two) times daily. Patient taking differently: Take 20 mg by mouth 2 (two) times daily as needed for heartburn or indigestion.  08/20/12  Yes Dammen, Theron Arista, PA-C  omeprazole (PRILOSEC) 20 MG capsule Take 2 capsules (40 mg total) by mouth daily. Patient taking differently: Take 40 mg by mouth daily as needed (acid reflux).  08/20/12  Yes Dammen, Theron Arista, PA-C  ranitidine (ZANTAC) 150 MG tablet Take 150 mg by mouth 2 (two) times daily as needed for heartburn.   Yes [provider]    Family History No family history on file.  Social History Social History   Tobacco Use  . Smoking status: Current Every Day Smoker    Packs/day: 0.25  . Smokeless tobacco: Never Used  Substance Use Topics  . Alcohol use: Yes    Comment: 1 per week  . Drug use: No     Allergies   Patient has no known allergies.   Review of Systems Review of Systems  All other systems reviewed and are negative.    Physical Exam Updated Vital Signs BP 127/66 (BP Location: Right Arm)   Pulse (!) 106   Temp 99.6 F (37.6 C) (Oral)   Resp 18   Ht 5\' 1"  (1.549 m)   Wt 67.6 kg (149 lb)   LMP 12/20/2017   SpO2 99%   BMI 28.15 kg/m   Physical Exam  Constitutional:  She appears well-developed and well-nourished. No distress.  HENT:  Head: Atraumatic.  Eyes: Conjunctivae are normal.  Neck: Neck supple. No thyromegaly present.  Cardiovascular: Normal rate, regular rhythm, intact distal pulses and normal pulses.  Pulmonary/Chest: Effort normal and breath sounds normal. She has no decreased breath sounds. She has no wheezes. She has no rhonchi. She has no rales.  Mild tenderness to left anterior chest wall without crepitus or emphysema.  No overlying skin changes.  Abdominal: Soft. There is no tenderness.  Musculoskeletal:       Right lower leg: She exhibits no edema.       Left lower leg: She exhibits no edema.  Neurological: She is alert.  Skin: No  rash noted.  Psychiatric: She has a normal mood and affect.  Nursing note and vitals reviewed.    ED Treatments / Results  Labs (all labs ordered are listed, but only abnormal results are displayed) Labs Reviewed  BASIC METABOLIC PANEL - Abnormal; Notable for the following components:      Result Value   Sodium 132 (*)    Potassium 3.4 (*)    Chloride 99 (*)    BUN 5 (*)    Calcium 8.8 (*)    All other components within normal limits  CBC - Abnormal; Notable for the following components:   WBC 13.0 (*)    Hemoglobin 11.3 (*)    HCT 34.0 (*)    All other components within normal limits  D-DIMER, QUANTITATIVE (NOT AT [facility]) - Abnormal; Notable for the following components:   D-Dimer, Quant 4.45 (*)    All other components within normal limits  SEDIMENTATION RATE - Abnormal; Notable for the following components:   Sed Rate 57 (*)    All other components within normal limits  RAPID STREP SCREEN (NOT AT [facility])  RESPIRATORY PANEL BY PCR  CULTURE, BLOOD (ROUTINE X 2)  CULTURE, BLOOD (ROUTINE X 2)  CULTURE, GROUP A STREP ([facility])  BRAIN NATRIURETIC PEPTIDE  TSH  ANTISTREPTOLYSIN O TITER  C-REACTIVE PROTEIN  I-STAT TROPONIN, ED  I-STAT BETA HCG BLOOD, ED ([facility], [facility], [facility] ONLY)  I-STAT TROPONIN, ED    EKG EKG Interpretation  Date/Time:  Thursday December 26 2017 19:42:34 EDT Ventricular Rate:  104 PR Interval:  168 QRS Duration: 82 QT Interval:  498 QTC Calculation: 654 R Axis:   91 Text Interpretation:  SR diffuse ST flattening similar to previous except increase lateral Twave flattening. (machine read of QTc appears erroneosly prolonged due to Twave flattening) Confirmed by Arby BarrettePfeiffer, Marcy [phone]) on 12/27/2017 7:53:49 AM   Radiology Dg Chest 2 View  Result Date: 12/26/2017 CLINICAL DATA:  Chest pain and shortness of breath beginning this afternoon. EXAM: CHEST - 2 VIEW COMPARISON:  Chest radiograph June 30, 2017 FINDINGS: Cardiac silhouette is moderately enlarged.  Pulmonary vascular congestion without pleural effusion or focal consolidation. No pneumothorax. Soft tissue planes included osseous structures are normal. IMPRESSION: Cardiomegaly and pulmonary vascular congestion. Electronically Signed   By: Awilda Metroourtnay  Bloomer M.D.   On: 12/26/2017 20:35    Procedures Procedures (including critical care time)  Medications Ordered in ED Medications  morphine 4 MG/ML injection 4 mg (4 mg Intravenous Given 12/27/17 1235)  colchicine tablet 0.6 mg (has no administration in time range)  acetaminophen (TYLENOL) tablet 650 mg (has no administration in time range)  ibuprofen (ADVIL,MOTRIN) tablet 400 mg (400 mg Oral Given 12/26/17 2333)  morphine 4 MG/ML injection 4 mg (4 mg Intravenous Given 12/27/17 0900)  iopamidol (ISOVUE-370)  76 % injection (100 mLs  Contrast Given 12/27/17 0928)  0.9 %  sodium chloride infusion ( Intravenous New Bag/Given 12/27/17 1400)     Initial Impression / Assessment and Plan / ED Course  I have reviewed the triage vital signs and the nursing notes.  Pertinent labs & imaging results that were available during my care of the patient were reviewed by me and considered in my medical decision making (see chart for details).     BP 127/66 (BP Location: Right Arm)   Pulse (!) 106   Temp 99.6 F (37.6 C) (Oral)   Resp 18   Ht 5\' 1"  (1.549 m)   Wt 67.6 kg (149 lb)   LMP 12/20/2017   SpO2 99%   BMI 28.15 kg/m    Final Clinical Impressions(s) / ED Diagnoses   Final diagnoses:  Acute viral pericarditis  Pericardial effusion    ED Discharge Orders    None     7:43 AM Patient report cold symptoms over days ago and now having pain to her chest with heart palpitation.  She is found to be tachycardic with a heart rate of 106.  EKG shows prolonged QT and sinus tachycardia.  No acute ischemic changes noted.  Initial troponin is normal.  Chest x-ray shows cardiomegaly and pulmonary vascular congestion.  Her BNP is within normal limit,  white count is mildly elevated at 13, pregnancy test is negative.  Will obtain a delta troponin, check d-dimer, and provide pain medication.  TSH ordered.  10:18 AM Elevated d-dimer 4.45, elevated sed rate of 57.  Chest CT angiogram demonstrate small to moderate pericardial effusion that suspects for associated serosal thickening and underlying pericarditis.  Trace left pleural effusion, possibly from serositis.  No evidence of PE.  Given this finding, will consult cardiology for further management which may include cardiac echo.  Care discussed with Dr. Vanessa Kick.  11:22 AM Appreciate consultation from CardMaster Trish who recommend obtaining echocardiogram and cardiology will see pt in the ER.    Will also check rapid strep screen, viral resp panel, blood culuture, Antistreptolysis O title, CRP.    2:31 PM Appreciate evaluate by cardiology.  It was felt pt does have pericarditis with pericardial effusion, likely viral in etiology.  Pt will receive colchicine treatment.  They have request medicine for admission.  Will consult medicine.   2:55 PM Appreciate consultation from Internal Medicine resident who agrees to see and admit pt for further management of her viral pericarditis with pericardial effusion.      Fayrene Helper, PA-C 12/27/17 1500    Arby Barrette, MD 01/05/18 [phone]

## 2017-12-27 NOTE — ED Provider Notes (Signed)
Medical screening examination/treatment/procedure(s) were conducted as a shared visit with non-physician practitioner(s) and myself.  I personally evaluated the patient during the encounter.  EKG Interpretation  Date/Time:  Thursday December 26 2017 19:42:34 EDT Ventricular Rate:  104 PR Interval:  168 QRS Duration: 82 QT Interval:  498 QTC Calculation: 654 R Axis:   91 Text Interpretation:  SR diffuse ST flattening similar to previous except increase lateral Twave flattening. (machine read of QTc appears erroneosly prolonged due to Twave flattening) Confirmed by Arby BarrettePfeiffer, Vernie Piet [phone]([id number]) on 12/27/2017 7:53:49 AM States she thought she was coming down with a cold last week.  She started with some sore throat and nasal congestion.  She began treating with OTC medications.  At the beginning of the week she then started developing some cough.  No fever.  She went to work yesterday.  Reports over the course of yesterday shortness of breath started to get much worse.  Also started to get chest pain that was through her chest into her back and shoulders.  Reports that is gotten much worse now.  No lower extremity swelling no calf pain.  She has no prior history of cardiac problems.  No history of IV drug abuse.  Occasional marijuana use.  No other drug use.  She has had left ankle surgery with hardware and 2 C-sections.  Patient is alert and nontoxic.  She does appear uncomfortable.  She does not have respiratory distress at rest but complains of shortness of breath.  Oropharynx widely patent.  No gross erythema or exudate.  Neck is supple.  No significant lymphadenopathy or thyromegaly.  Heart has 2-3\6 systolic ejection murmur.  Mild tachycardia.  Lungs grossly clear but patient does not take deep inspiration.  Abdomen soft nondistended and nontender.  Lower extremities normal without edema or calf tenderness.  Has a well-healed surgical site at the lateral right ankle with no joint effusion or erythema.  Hands  are normal with no palmar lesions or nail lesions.   Patient presents unwell.  Chest x-ray shows cardiomegaly and mild vascular congestion.  EKG has diffuse T wave flattening.  CT chest confirms pericardial thickening and pericardial effusion.  No PE.  Patient will need echocardiogram and cardiology consultation.  Differential diagnosis includes viral pericarditis\myocarditis, I have low suspicion at this time for bacterial etiology but we will obtain cultures.  Patient does not have underlying risk factors of hypertension, diabetes or family history.  History, drug use does not appear to be likely etiology.  Symptoms appear to be fairly acute without indolent course to suggest underlying metabolic\endocrine dysfunction.   Arby BarrettePfeiffer, Demi Trieu, MD 12/27/17 1131

## 2017-12-27 NOTE — Progress Notes (Signed)
  Echocardiogram 2D Echocardiogram has been performed.  Roosvelt MaserLane, Karren Newland F 12/27/2017, 12:31 PM

## 2017-12-27 NOTE — H&P (Addendum)
Date: 12/27/2017               Patient Name:  Diana Huynh MRN: [id number]  DOB: [birth year] Age / Sex: 37 y.o., female   PCP: System, Pcp Not In         Medical Service: Internal Medicine Teaching Service         Attending Physician: Dr. Heber Odin, Rachel Moulds, DO    First Contact: Dr. Aggie Hacker Pager: [phone]  Second Contact: Dr. Danford Bad Pager: [phone]       After Hours (After 5p/  First Contact Pager: [phone]  weekends / holidays): Second Contact Pager: [phone]   Chief Complaint: chest pain  History of Present Illness:  37 yo female with PMH of GERD presenting with chief complaint of chest pain. Patient states she started having chest pain the evening prior to admission. She describes the pain as squeezing/pressure like in quality, located centrally and radiated underneath her left breat and to her back. Nothing makes the pain better.The pain is worse with movement and inspiration. The pain is constant. She has never had this pain before. She states she has had cold/URI symptoms this past week with congestion, sore throat, rhinorrhea, and mildly productive cough. She denies fevers but endorses muscle aches, chills, and generalized fatigue.   ED course: Vitals: BP 127/66, HR 106, Temp 99.6, SpO2 99% on RA Labs: Na 132, K 3.4, Cr 0.69, WBC 13.0, Hgb 11.3,  ESR 57, CRP 8.6, D-dimer 4.45, I-stat troponin negative x 2, RVP negative Meds: tylenol, morphine, ibuprofen Imaging: CTA negative for acute PE, evidence of small to moderate pericardial effusion with associated serosal thickening and likely underlying pericarditis. Consults: Cards   Meds:  Current Meds  Medication Sig  . alum & mag hydroxide-simeth (MAALOX REGULAR STRENGTH) 200-200-20 MG/5ML suspension Take 30 mLs by mouth every 6 (six) hours as needed for indigestion or heartburn.  . famotidine (PEPCID) 20 MG tablet Take 1 tablet (20 mg total) by mouth 2 (two) times daily. (Patient taking differently: Take 20 mg by mouth 2 (two) times  daily as needed for heartburn or indigestion. )  . omeprazole (PRILOSEC) 20 MG capsule Take 2 capsules (40 mg total) by mouth daily. (Patient taking differently: Take 40 mg by mouth daily as needed (acid reflux). )  . ranitidine (ZANTAC) 150 MG tablet Take 150 mg by mouth 2 (two) times daily as needed for heartburn.  . [DISCONTINUED] HYDROcodone-homatropine (HYCODAN) 5-1.5 MG/5ML syrup Take 5 mLs by mouth every 6 (six) hours as needed for cough. Take 41m every 6hrs for cough     Allergies: Allergies as of 12/26/2017  . (No Known Allergies)   Past Medical History:  Diagnosis Date  . Bronchitis     Family History:  Family history reviewed, non-contributary  Social History:  Social History   Tobacco Use  . Smoking status: Current Every Day Smoker    Packs/day: 0.25  . Smokeless tobacco: Never Used  Substance Use Topics  . Alcohol use: Yes    Comment: 1 per week  . Drug use: Marijauna    Review of Systems: A complete ROS was negative except as per HPI.   Physical Exam: Blood pressure 133/80, pulse (!) 104, temperature (!) 101 F (38.3 C), temperature source Rectal, resp. rate 14, height '5\' 1"'  (1.549 m), weight 149 lb (67.6 kg), last menstrual period 12/20/2017, SpO2 100 %. Physical Exam  Constitutional: She is oriented to person, place, and time. She appears well-developed and well-nourished. She appears  distressed (distressd when asked to move, take deep breaths, related to pain).  Eyes: Conjunctivae are normal. No scleral icterus.  Neck: Normal range of motion. Neck supple.  Cardiovascular: Regular rhythm. Tachycardia present.  Pulmonary/Chest: Effort normal. She exhibits tenderness.  Patient not tachypneic. Has difficulty with deep inspiration due to pain. CTAB in anterior and lateral lung fields.   Abdominal: Soft. Bowel sounds are normal. There is no tenderness.  Lymphadenopathy:    She has no cervical adenopathy.  Neurological: She is alert and oriented to person,  place, and time. No cranial nerve deficit.  Skin: Skin is warm and dry. She is not diaphoretic.    EKG: personally reviewed my interpretation is sinus tachycarida, TWI V3, V4, V5, T wave flattening in V6   CXR: personally reviewed my interpretation is negative for focal opacity or pleural effusion   Assessment & Plan by Problem: Active Problems:   Viral pericarditis  Viral Pericarditis 37 yo female presenting with acute onset pleuritic chest pain in the setting of viral URI. EKG with nonspecific ST segment changes, I-stat troponin negative x 2, tobacco use only risk factors for ACS. CTA negative for PE, but revealed small pericardial effusion and associated serosal thickening consistent with pericarditis. Echo performed wth EF 60-65%, small pericardial effusion without tamponade features.  -Cardiac monitoring -Colchicine 0.6 mg BID -Will continue to trend serial troponin  -CBC, CMP in AM -Pain control with toradol 30 mg PRN q6 hours  DVT pxx: lovenox Diet: Regular   Dispo: Admit patient to Inpatient with expected length of stay greater than 2 midnights.  Signed: Melanee Spry, MD 12/27/2017, 4:50 PM  Pager: [phone]

## 2017-12-28 DIAGNOSIS — I313 Pericardial effusion (noninflammatory): Secondary | ICD-10-CM

## 2017-12-28 DIAGNOSIS — I3139 Other pericardial effusion (noninflammatory): Secondary | ICD-10-CM

## 2017-12-28 LAB — COMPREHENSIVE METABOLIC PANEL
ALBUMIN: 3 g/dL — AB (ref 3.5–5.0)
ALT: 16 U/L (ref 14–54)
ANION GAP: 8 (ref 5–15)
AST: 16 U/L (ref 15–41)
Alkaline Phosphatase: 49 U/L (ref 38–126)
BUN: 8 mg/dL (ref 6–20)
CHLORIDE: 104 mmol/L (ref 101–111)
CO2: 23 mmol/L (ref 22–32)
Calcium: 8.3 mg/dL — ABNORMAL LOW (ref 8.9–10.3)
Creatinine, Ser: 0.75 mg/dL (ref 0.44–1.00)
GFR calc Af Amer: >60 mL/min (ref >60)
GFR calc non Af Amer: >60 mL/min (ref >60)
GLUCOSE: 115 mg/dL — AB (ref 65–99)
POTASSIUM: 3.4 mmol/L — AB (ref 3.5–5.1)
Sodium: 135 mmol/L (ref 135–145)
Total Bilirubin: 0.7 mg/dL (ref 0.3–1.2)
Total Protein: 7.3 g/dL (ref 6.5–8.1)

## 2017-12-28 LAB — CBC
HEMATOCRIT: 32 % — AB (ref 36.0–46.0)
HEMOGLOBIN: 10.5 g/dL — AB (ref 12.0–15.0)
MCH: 28 pg (ref 26.0–34.0)
MCHC: 32.8 g/dL (ref 30.0–36.0)
MCV: 85.3 fL (ref 78.0–100.0)
Platelets: 327 10*3/uL (ref 150–400)
RBC: 3.75 MIL/uL — ABNORMAL LOW (ref 3.87–5.11)
RDW: 14.2 % (ref 11.5–15.5)
WBC: 11.4 10*3/uL — AB (ref 4.0–10.5)

## 2017-12-28 LAB — GLUCOSE, CAPILLARY: Glucose-Capillary: 139 mg/dL — ABNORMAL HIGH (ref 65–99)

## 2017-12-28 LAB — ANTISTREPTOLYSIN O TITER: ASO: 237 [IU]/mL — AB (ref 0.0–200.0)

## 2017-12-28 LAB — TROPONIN I

## 2017-12-28 LAB — HIV ANTIBODY (ROUTINE TESTING W REFLEX): HIV SCREEN 4TH GENERATION: NONREACTIVE

## 2017-12-28 MED ORDER — PANTOPRAZOLE SODIUM 40 MG PO TBEC
40.0000 mg | DELAYED_RELEASE_TABLET | Freq: Every day | ORAL | Status: DC
  Administered 2017-12-28 – 2017-12-31 (×4): 40 mg via ORAL
  Filled 2017-12-28 (×4): qty 1

## 2017-12-28 MED ORDER — POTASSIUM CHLORIDE CRYS ER 20 MEQ PO TBCR
40.0000 meq | EXTENDED_RELEASE_TABLET | Freq: Once | ORAL | Status: AC
  Administered 2017-12-28: 40 meq via ORAL
  Filled 2017-12-28: qty 2

## 2017-12-28 MED ORDER — MORPHINE SULFATE (PF) 2 MG/ML IV SOLN
2.0000 mg | INTRAVENOUS | Status: AC | PRN
  Administered 2017-12-28 (×2): 2 mg via INTRAVENOUS
  Filled 2017-12-28 (×2): qty 1

## 2017-12-28 MED ORDER — PREDNISONE 20 MG PO TABS: 40.0000 mg | ORAL_TABLET | Freq: Every day | ORAL | Status: DC

## 2017-12-28 MED ORDER — PREDNISONE 20 MG PO TABS
40.0000 mg | ORAL_TABLET | Freq: Every day | ORAL | Status: AC
  Administered 2017-12-28 – 2017-12-31 (×4): 40 mg via ORAL
  Filled 2017-12-28 (×4): qty 2

## 2017-12-28 MED ORDER — MORPHINE SULFATE (PF) 2 MG/ML IV SOLN
2.0000 mg | INTRAVENOUS | Status: DC | PRN
  Administered 2017-12-29 (×3): 2 mg via INTRAVENOUS
  Filled 2017-12-28 (×3): qty 1

## 2017-12-28 NOTE — Progress Notes (Addendum)
DAILY PROGRESS NOTE   Patient Name: Diana Huynh Date of Encounter: 12/28/2017  Chief Complaint   Chest pain overnight, febrile  Patient Profile   Diana Huynh is a 37 y.o. female with a history of recent viral illness reports to [facility] with chest pain. Cardiology was asked to consult for chest pain at the request of Dr. Johnney Killian.  Subjective   Continues to have fever overnight to 101, reports chest pain. She is on colchicine and has received morphine. ESR elevated at 57, CRP 8.6. I personally reviewed her echo yesterday, which shows a small pericardial effusion and no tamponade physiology. Findings more suggestive that her chest pain is d/t pericarditis.   Objective   Vitals:   12/28/17 0001 12/28/17 0300 12/28/17 0359 12/28/17 0750  BP: 132/86 130/82 130/82 119/84  Pulse: 100 96 97 (!) 107  Resp: 19 (!) 28 20 (!) 26  Temp: 99 F (37.2 C) 98.9 F (37.2 C)  99.9 F (37.7 C)  TempSrc: Oral Oral  Oral  SpO2: 99% 100% 98% 98%  Weight:  152 lb 8.9 oz (69.2 kg)    Height:        Intake/Output Summary (Last 24 hours) at 12/28/2017 1022 Last data filed at 12/28/2017 1610 Gross per 24 hour  Intake 2752.09 ml  Output -  Net 2752.09 ml   Filed Weights   12/27/17 0651 12/27/17 1859 12/28/17 0300  Weight: 149 lb (67.6 kg) 150 lb 5.7 oz (68.2 kg) 152 lb 8.9 oz (69.2 kg)    Physical Exam   General appearance: sedate, but awake, no distress Neck: no carotid bruit, no JVD and thyroid not enlarged, symmetric, no tenderness/mass/nodules Lungs: clear to auscultation bilaterally Heart: regular rate and rhythm, S1, S2 normal and no rub Abdomen: soft, non-tender; bowel sounds normal; no masses,  no organomegaly Extremities: extremities normal, atraumatic, no cyanosis or edema Pulses: 2+ and symmetric Skin: Skin color, texture, turgor normal. No rashes or lesions Neurologic: Grossly normal Psych: Pleasant  Inpatient Medications    Scheduled Meds: . aspirin EC  81 mg Oral Daily    . colchicine  0.6 mg Oral BID  . enoxaparin (LOVENOX) injection  40 mg Subcutaneous Q24H  . pantoprazole  40 mg Oral Daily  . ramelteon  8 mg Oral QHS    Continuous Infusions:   PRN Meds: acetaminophen **OR** acetaminophen, alum & mag hydroxide-simeth, ketorolac, morphine injection, ondansetron **OR** ondansetron (ZOFRAN) IV, senna-docusate   Labs   Results for orders placed or performed during the hospital encounter of 12/27/17 (from the past 48 hour(s))  Basic metabolic panel     Status: Abnormal   Collection Time: 12/26/17  8:10 PM  Result Value Ref Range   Sodium 132 (L) 135 - 145 mmol/L   Potassium 3.4 (L) 3.5 - 5.1 mmol/L   Chloride 99 (L) 101 - 111 mmol/L   CO2 22 22 - 32 mmol/L   Glucose, Bld 99 65 - 99 mg/dL   BUN 5 (L) 6 - 20 mg/dL   Creatinine, Ser 0.69 0.44 - 1.00 mg/dL   Calcium 8.8 (L) 8.9 - 10.3 mg/dL   GFR calc non Af Amer >60 >60 mL/min   GFR calc Af Amer >60 >60 mL/min    Comment: (NOTE) The eGFR has been calculated using the CKD EPI equation. This calculation has not been validated in all clinical situations. eGFR's persistently <60 mL/min signify possible Chronic Kidney Disease.    Anion gap 11 5 - 15    Comment: Performed  at [facility], [place] [address] [place], [place] [address]  CBC     Status: Abnormal   Collection Time: 12/26/17  8:10 PM  Result Value Ref Range   WBC 13.0 (H) 4.0 - 10.5 K/uL   RBC 4.04 3.87 - 5.11 MIL/uL   Hemoglobin 11.3 (L) 12.0 - 15.0 g/dL   HCT 34.0 (L) 36.0 - 46.0 %   MCV 84.2 78.0 - 100.0 fL   MCH 28.0 26.0 - 34.0 pg   MCHC 33.2 30.0 - 36.0 g/dL   RDW 14.1 11.5 - 15.5 %   Platelets 376 150 - 400 K/uL    Comment: Performed at [facility] [address], [place], [place] [address]  Brain natriuretic peptide     Status: None   Collection Time: 12/26/17  8:10 PM  Result Value Ref Range   B Natriuretic Peptide 28.6 0.0 - 100.0 pg/mL    Comment: Performed at [facility] [address],  [place], [place] [address]  I-stat troponin, ED     Status: None   Collection Time: 12/26/17  8:29 PM  Result Value Ref Range   Troponin i, poc 0.00 0.00 - 0.08 ng/mL   Comment 3            Comment: Due to the release kinetics of cTnI, a negative result within the first hours of the onset of symptoms does not rule out myocardial infarction with certainty. If myocardial infarction is still suspected, repeat the test at appropriate intervals.   I-Stat beta hCG blood, ED     Status: None   Collection Time: 12/26/17  8:29 PM  Result Value Ref Range   I-stat hCG, quantitative <5.0 <5 mIU/mL   Comment 3            Comment:   GEST. AGE      CONC.  (mIU/mL)   <=1 WEEK        5 - 50     2 WEEKS       50 - 500     3 WEEKS       100 - 10,000     4 WEEKS     1,000 - 30,000        FEMALE AND NON-PREGNANT FEMALE:     LESS THAN 5 mIU/mL   TSH     Status: None   Collection Time: 12/27/17  7:37 AM  Result Value Ref Range   TSH 1.582 0.350 - 4.500 uIU/mL    Comment: Performed by a 3rd Generation assay with a functional sensitivity of <=0.01 uIU/mL. Performed at [facility], [place] [address] [place], [place] [address]   Sedimentation rate     Status: Abnormal   Collection Time: 12/27/17  8:00 AM  Result Value Ref Range   Sed Rate 57 (H) 0 - 22 mm/hr    Comment: Performed at [facility] [address], [place], [place] [address]  D-dimer, quantitative (not at [facility])     Status: Abnormal   Collection Time: 12/27/17  8:19 AM  Result Value Ref Range   D-Dimer, Quant 4.45 (H) 0.00 - 0.50 ug/mL-FEU    Comment: (NOTE) At the manufacturer cut-off of 0.50 ug/mL FEU, this assay has been documented to exclude PE with a sensitivity and negative predictive value of 97 to 99%.  At this time, this assay has not been approved by the FDA to exclude DVT/VTE. Results should be correlated with  clinical presentation. Performed at [facility], [place] [address] [place], [place] [address]     I-stat troponin, ED     Status: None   Collection Time: 12/27/17  8:28 AM  Result Value Ref Range   Troponin i, poc 0.00 0.00 - 0.08 ng/mL   Comment 3            Comment: Due to the release kinetics of cTnI, a negative result within the first hours of the onset of symptoms does not rule out myocardial infarction with certainty. If myocardial infarction is still suspected, repeat the test at appropriate intervals.   C-reactive protein     Status: Abnormal   Collection Time: 12/27/17  1:41 PM  Result Value Ref Range   CRP 8.6 (H) <1.0 mg/dL    Comment: Performed at [facility] [address], [place], [place] [address]  Rapid strep screen     Status: None   Collection Time: 12/27/17  2:03 PM  Result Value Ref Range   Streptococcus, Group A Screen (Direct) NEGATIVE NEGATIVE    Comment: (NOTE) A Rapid Antigen test may result negative if the antigen level in the sample is below the detection level of this test. The FDA has not cleared this test as a stand-alone test therefore the rapid antigen negative result has reflexed to a Group A Strep culture. Performed at [facility], [place] [address], [place], [place] [address]   Antistreptolysin O titer     Status: Abnormal   Collection Time: 12/27/17  2:03 PM  Result Value Ref Range   ASO 237.0 (H) 0.0 - 200.0 IU/mL    Comment: (NOTE) Performed At: [facility] [place], [place] [id number] Rush Farmer MD [id number] Performed at [facility], [place] [address], [place], [place] [address]   Respiratory Panel by PCR     Status: None   Collection Time: 12/27/17  2:03 PM  Result Value Ref Range   Adenovirus NOT DETECTED NOT DETECTED   Coronavirus 229E NOT DETECTED NOT DETECTED   Coronavirus HKU1 NOT DETECTED NOT DETECTED   Coronavirus NL63 NOT DETECTED NOT DETECTED   Coronavirus OC43 NOT DETECTED NOT DETECTED   Metapneumovirus NOT DETECTED NOT DETECTED   Rhinovirus / Enterovirus NOT  DETECTED NOT DETECTED   Influenza A NOT DETECTED NOT DETECTED   Influenza B NOT DETECTED NOT DETECTED   Parainfluenza Virus 1 NOT DETECTED NOT DETECTED   Parainfluenza Virus 2 NOT DETECTED NOT DETECTED   Parainfluenza Virus 3 NOT DETECTED NOT DETECTED   Parainfluenza Virus 4 NOT DETECTED NOT DETECTED   Respiratory Syncytial Virus NOT DETECTED NOT DETECTED   Bordetella pertussis NOT DETECTED NOT DETECTED   Chlamydophila pneumoniae NOT DETECTED NOT DETECTED   Mycoplasma pneumoniae NOT DETECTED NOT DETECTED    Comment: Performed at [facility] [address], [place], [place] [address]  HIV antibody (Routine Testing)     Status: None   Collection Time: 12/27/17  5:06 PM  Result Value Ref Range   HIV Screen 4th Generation wRfx Non Reactive Non Reactive    Comment: (NOTE) Performed At: [facility] [address] [place], [place] [id number] Rush Farmer MD [id number] Performed at [facility], [place] [address] [place], [place] [address]   Troponin I     Status: None   Collection Time: 12/27/17  5:06 PM  Result Value Ref Range   Troponin I <0.03 <0.03 ng/mL  Comment: Performed at [facility], [place] [address], [place], [place] [address]  Hemoglobin A1c     Status: None   Collection Time: 12/27/17  5:06 PM  Result Value Ref Range   Hgb A1c MFr Bld 5.3 4.8 - 5.6 %    Comment: (NOTE) Pre diabetes:          5.7%-6.4% Diabetes:              >6.4% Glycemic control for   <7.0% adults with diabetes    Mean Plasma Glucose 105.41 mg/dL    Comment: Performed at [facility] [address] [place], [place] [address]  Lipid panel     Status: Abnormal   Collection Time: 12/27/17  5:06 PM  Result Value Ref Range   Cholesterol 119 0 - 200 mg/dL   Triglycerides 41 <150 mg/dL   HDL 31 (L) >40 mg/dL   Total CHOL/HDL Ratio 3.8 RATIO   VLDL 8 0 - 40 mg/dL   LDL Cholesterol 80 0 - 99 mg/dL    Comment:        Total Cholesterol/HDL:CHD Risk Coronary Heart  Disease Risk Table                     Men   Women  1/2 Average Risk   3.4   3.3  Average Risk       5.0   4.4  2 X Average Risk   9.6   7.1  3 X Average Risk  23.4   11.0        Use the calculated Patient Ratio above and the CHD Risk Table to determine the patient's CHD Risk.        ATP III CLASSIFICATION (LDL):  <100     mg/dL   Optimal  100-129  mg/dL   Near or Above                    Optimal  130-159  mg/dL   Borderline  160-189  mg/dL   High  >190     mg/dL   Very High Performed at [facility] [address], [place], [place] [address]   Creatinine, serum     Status: None   Collection Time: 12/27/17  5:06 PM  Result Value Ref Range   Creatinine, Ser 0.80 0.44 - 1.00 mg/dL   GFR calc non Af Amer >60 >60 mL/min   GFR calc Af Amer >60 >60 mL/min    Comment: (NOTE) The eGFR has been calculated using the CKD EPI equation. This calculation has not been validated in all clinical situations. eGFR's persistently <60 mL/min signify possible Chronic Kidney Disease. Performed at [facility], [place] [address], [place], [place] [address]   CBC     Status: Abnormal   Collection Time: 12/27/17  5:06 PM  Result Value Ref Range   WBC 16.1 (H) 4.0 - 10.5 K/uL   RBC 3.98 3.87 - 5.11 MIL/uL   Hemoglobin 11.2 (L) 12.0 - 15.0 g/dL   HCT 33.9 (L) 36.0 - 46.0 %   MCV 85.2 78.0 - 100.0 fL   MCH 28.1 26.0 - 34.0 pg   MCHC 33.0 30.0 - 36.0 g/dL   RDW 14.2 11.5 - 15.5 %   Platelets 379 150 - 400 K/uL    Comment: Performed at [address] [place],  [address]  MRSA PCR Screening  Status: None   Collection Time: 12/27/17  8:11 PM  Result Value Ref Range   MRSA by PCR NEGATIVE NEGATIVE    Comment:        The GeneXpert MRSA Assay (FDA approved for NASAL specimens only), is one component of a comprehensive MRSA colonization surveillance program. It is not intended to diagnose MRSA infection nor to guide or monitor treatment for MRSA  infections. Performed at [facility], [place] [address] [place], [place] [address]   Troponin I     Status: None   Collection Time: 12/27/17 10:03 PM  Result Value Ref Range   Troponin I <0.03 <0.03 ng/mL    Comment: Performed at [facility] [address], [place], [place] [address]  Troponin I     Status: None   Collection Time: 12/28/17  4:21 AM  Result Value Ref Range   Troponin I <0.03 <0.03 ng/mL    Comment: Performed at [facility] [address], [place], [place] [address]  Comprehensive metabolic panel     Status: Abnormal   Collection Time: 12/28/17  4:21 AM  Result Value Ref Range   Sodium 135 135 - 145 mmol/L   Potassium 3.4 (L) 3.5 - 5.1 mmol/L   Chloride 104 101 - 111 mmol/L   CO2 23 22 - 32 mmol/L   Glucose, Bld 115 (H) 65 - 99 mg/dL   BUN 8 6 - 20 mg/dL   Creatinine, Ser 0.75 0.44 - 1.00 mg/dL   Calcium 8.3 (L) 8.9 - 10.3 mg/dL   Total Protein 7.3 6.5 - 8.1 g/dL   Albumin 3.0 (L) 3.5 - 5.0 g/dL   AST 16 15 - 41 U/L   ALT 16 14 - 54 U/L   Alkaline Phosphatase 49 38 - 126 U/L   Total Bilirubin 0.7 0.3 - 1.2 mg/dL   GFR calc non Af Amer >60 >60 mL/min   GFR calc Af Amer >60 >60 mL/min    Comment: (NOTE) The eGFR has been calculated using the CKD EPI equation. This calculation has not been validated in all clinical situations. eGFR's persistently <60 mL/min signify possible Chronic Kidney Disease.    Anion gap 8 5 - 15    Comment: Performed at [facility] [address] [place], [place] [address]  CBC     Status: Abnormal   Collection Time: 12/28/17  4:21 AM  Result Value Ref Range   WBC 11.4 (H) 4.0 - 10.5 K/uL   RBC 3.75 (L) 3.87 - 5.11 MIL/uL   Hemoglobin 10.5 (L) 12.0 - 15.0 g/dL   HCT 32.0 (L) 36.0 - 46.0 %   MCV 85.3 78.0 - 100.0 fL   MCH 28.0 26.0 - 34.0 pg   MCHC 32.8 30.0 - 36.0 g/dL   RDW 14.2 11.5 - 15.5 %   Platelets 327 150 - 400 K/uL    Comment: Performed at [facility], [place] [address],  [place], [place] [address]    ECG   Sinus tachy at 104 (3/29) - Personally Reviewed  Telemetry   Sinus rhythm - Personally Reviewed  Radiology    Dg Chest 2 View  Result Date: 12/26/2017 CLINICAL DATA:  Chest pain and shortness of breath beginning this afternoon. EXAM: CHEST - 2 VIEW COMPARISON:  Chest radiograph June 30, 2017 FINDINGS: Cardiac silhouette is moderately enlarged. Pulmonary vascular congestion without pleural effusion or focal consolidation. No pneumothorax. Soft tissue planes included osseous structures are normal. IMPRESSION:  Cardiomegaly and pulmonary vascular congestion. Electronically Signed   By: Elon Alas M.D.   On: 12/26/2017 20:35   Ct Angio Chest Pe W And/or Wo Contrast  Result Date: 12/27/2017 CLINICAL DATA:  Shortness of breath and chest pain EXAM: CT ANGIOGRAPHY CHEST WITH CONTRAST TECHNIQUE: Multidetector CT imaging of the chest was performed using the standard protocol during bolus administration of intravenous contrast. Multiplanar CT image reconstructions and MIPs were obtained to evaluate the vascular anatomy. CONTRAST:  136m ISOVUE-370 IOPAMIDOL (ISOVUE-370) INJECTION 76% COMPARISON:  06/30/2017 FINDINGS: Cardiovascular: Borderline heart size. Small to moderate pericardial effusion. Detection of serosal enhancement is limited by CTA timing, but there is suggestion of serosal thickening which would imply pericarditis. Negative for pulmonary embolism. Negative thoracic aorta. Mediastinum/Nodes: Negative for adenopathy or mass. Lungs/Pleura: Mild atelectasis and trace left pleural effusion. Upper Abdomen: Negative Musculoskeletal: No acute or aggressive finding Review of the MIP images confirms the above findings. IMPRESSION: 1. Small to moderate pericardial effusion. Although limited by CTA timing, suspect there is associated serosal thickening and underlying pericarditis. 2. Trace left pleural effusion, possibly from serositis. 3. Atelectasis. 4. Negative  for pulmonary embolism. Electronically Signed   By: JMonte FantasiaM.D.   On: 12/27/2017 09:56    Cardiac Studies   LV EF: 60% -   65%  ------------------------------------------------------------------- Indications:      Pericardial effusion 423.9.  ------------------------------------------------------------------- History:   PMH:  Productive cough. Chest pain. Upper back pain since 5:00pm yesterday. Pain with breathing.  Dyspnea.  ------------------------------------------------------------------- Study Conclusions  - Left ventricle: The cavity size was normal. Wall thickness was   normal. Systolic function was normal. The estimated ejection   fraction was in the range of 60% to 65%. Wall motion was normal;   there were no regional wall motion abnormalities. Left   ventricular diastolic function parameters were normal. - Mitral valve: Mildly thickened leaflets . There was trivial   regurgitation. - Left atrium: The atrium was normal in size. - Pericardium, extracardiac: A small pericardial effusion was   identified. Features were not consistent with tamponade   physiology.  Impressions:  - LVEF 60-65%, normal wall thickness, normal wall motion, normal   diastolic function, trivial MR, normal LA size, small pericardial   effusion without tamponade features.  ------------------------------------------------------------------- Labs, prior tests, procedures, and surgery: CT of chest which showed signs of pericarditis and a small to moderate sized pericardial effusion.  Assessment   Principal Problem:   Pericarditis, acute   Plan   1. Ms. BGowenshas acute inflammatory pericarditis - this could be viral, autoimmune or idiopathic. ESR and CRP are elevated, consistent with either. Given her young age, gender and race, consider lupus or other autoimmune d/o as well. Will send a screening ANA. Viral panel negative, but this is often the case with viral pericarditis as  well. I agree with plan to start steroids in addition to colchicine. Prednisone 40 mg daily x 4 days, then 20 mg daily for 4 days, then 10 mg daily for 4 days, the 5 mg daily for 2 days then stop. Would preferentially use toradol or NSAID for breakthrough pain rather than opioids.  Time Spent Directly with Patient:  I have spent a total of 25 minutes with the patient reviewing hospital notes, telemetry, EKGs, labs and examining the patient as well as establishing an assessment and plan that was discussed personally with the patient. > 50% of time was spent in direct patient care.  Length of Stay:  LOS: 1 day  Pixie Casino, MD, [facility], [place] Director of the Advanced Lipid Disorders &  Cardiovascular Risk Reduction Clinic Diplomate of the American Board of Clinical Lipidology Attending Cardiologist  Direct Dial: [phone]  Fax: [phone]  Website:  [email or web address] Arlie Posch 12/28/2017, 10:22 AM

## 2017-12-28 NOTE — Progress Notes (Addendum)
   Subjective:  Patient seen and examined. Had uncontrolled pain overnight, that was improved with morphine. States her pain is 5/10 this morning, improved from yesterday, but still very painful. She has been able to tolerate PO intake. Denies change in quality of the chest pain.   Objective:  Vital signs in last 24 hours: Vitals:   12/27/17 2016 12/28/17 0001 12/28/17 0300 12/28/17 0359  BP: 114/73 132/86 130/82 130/82  Pulse:  100 96 97  Resp: (!) 22 19 (!) 28 20  Temp: 98.1 F (36.7 C) 99 F (37.2 C) 98.9 F (37.2 C)   TempSrc: Oral Oral Oral   SpO2: 100% 99% 100% 98%  Weight:   152 lb 8.9 oz (69.2 kg)   Height:       General: Sitting up in bed, appears to be in mild discomfort HEENT: [place]/AT, no scleral icterus Cardiac: Tachycardic, regular rhythm, No R/M/G appreciated Pulm: normal effort, CTAB in anterior lung fields Abd: soft, non tender, non distended, BS normal Ext: extremities well perfused, no peripheral edema Neuro: alert and oriented X3, cranial nerves II-XII grossly intact   Assessment/Plan:  Active Problems:   Viral pericarditis   Viral Pericarditis 37 yo female presenting with acute onset pleuritic chest pain in the setting of viral URI. EKG with nonspecific ST segment changes, ACS ruled out, troponin level negative x 3. CTA negative for PE, but demonstrated small pericardial effusion and associated serosal thickening consistent with pericarditis. Echo performed wth EF 60-65%, small pericardial effusion without tamponade features. Currently having difficulty with pain control. Patient tachycardic, but otherwise afebrile with stable vital signs. Leukcytosis improved today, 11.4 from 16.1. -Cardiac monitoring - personally reviewed, sinus tachycardia -Continue Colchicine 0.6 mg BID -Pain control with morphine 2 mg q 3 hours for severe pain, PRN toradol 30 mg q6 hr PRN -CBC, BMP in AM -Cardiology on board, appreciate recs -Patient with elevated D-dimer, but  likely acute phase reactant, patient has no unilateral leg swelling or redness on examination; CTA negative for PE   Dispo: Anticipated discharge in approximately 1-2 day(s).   Toney RakesLacroce, Samantha J, MD 12/28/2017, 6:29 AM Pager: [phone]

## 2017-12-28 NOTE — Progress Notes (Signed)
Called about pt with continuing pain from pericarditis.  Had extensive workup on admission, do not suspect other causes PE, dissection, ACS ruled out and good evidence for pericarditis seen on imaging and clinical history fits.  Pain exacerbated by changing position in bed, pt states pain has been refractory to all methods of pain relief so far (which have been extensive) but relieved by morphine in ED.  Vitals stable, HR 104 while in room.  Her pain does appear genuine, my first inclination was to start prednisone but do not want pt to stay up for the rest of the night.  Will give a smaller 2mg  IV dose of morphine and see how pt responds will leave beginning prednisone decision to our day team.

## 2017-12-28 NOTE — Discharge Summary (Signed)
Name: Diana EllisShunoah Huynh MRN: [id number] DOB: 06/26/1981 37 y.o. PCP: System, Pcp Not In  Date of Admission: 12/27/2017  6:38 AM Date of Discharge: 12/31/2017 Attending Physician: Tyson AliasVincent, Duncan Thomas, *  Discharge Diagnosis:  Principal Problem:   Acute pericarditis associated with systemic lupus erythematosus (SLE) (HCC) Active Problems:   Pericardial effusion   Pleuritis   Discharge Medications: Allergies as of 12/31/2017   No Known Allergies     Medication List    TAKE these medications   alum & mag hydroxide-simeth 200-200-20 MG/5ML suspension Commonly known as:  MAALOX REGULAR STRENGTH Take 30 mLs by mouth every 6 (six) hours as needed for indigestion or heartburn.   colchicine 0.6 MG tablet Take 1 tablet (0.6 mg total) by mouth 2 (two) times daily.   famotidine 20 MG tablet Commonly known as:  PEPCID Take 1 tablet (20 mg total) by mouth 2 (two) times daily. What changed:    when to take this  reasons to take this   HYDROcodone-acetaminophen 5-325 MG tablet Commonly known as:  NORCO/VICODIN Take 1 tablet by mouth every 4 (four) hours as needed for up to 7 days for moderate pain or severe pain.   hydroxychloroquine 200 MG tablet Commonly known as:  PLAQUENIL Take 1 tablet (200 mg total) by mouth daily. Start taking on:  01/01/2018   ibuprofen 600 MG tablet Commonly known as:  ADVIL,MOTRIN Take 1 tablet (600 mg total) by mouth 3 (three) times daily.   omeprazole 20 MG capsule Commonly known as:  PRILOSEC Take 2 capsules (40 mg total) by mouth daily. What changed:    when to take this  reasons to take this   ranitidine 150 MG tablet Commonly known as:  ZANTAC Take 150 mg by mouth 2 (two) times daily as needed for heartburn.       Disposition and follow-up:   Ms.Rhiann Monter [facility] in St[facility] condition.  At the hospital follow up visit please address:  1.  Acute Lupus Pericarditis, Small Pericardial Effusion -  Monitor for Improvement/resolution of pain - Ensure patient has follow up arranged with cardiology (for repeat echo and effusion monitoring) - Ensure rheumatology consult is progressing - Ensure access to Medications as below: > Hydroxyxhloroquine (Plaquinil) 200mg  Daily (Through IM Program or MATCH program) > Colchicine 0.6mg  Twice a day for 3 months ( MATCH Program) > Prednisone Taper 40mg  Daily for 3 more days, then 30mg  Daily for 7 days, then 20mg  Daily for 7d, then 10mg  daily for 7d (will need Rx for last two weeks, Rx at discharge was only for 20 and 10mg  portions of taper) > Ibuprofen 600mg  three times a day for 2 weeks, then tapered by 200 per week > Patient prescribed a 7 day supply of pain medication  Other: Patient will need to apply for [employer], Appointment for this should already be arranged  2.  Labs / imaging needed at time of follow-up: None  3.  Pending labs/ test needing follow-up: C3 Complement, C4 Complement  Follow-up Appointments: Follow-up Information    Tereso NewcomerWeaver, Scott T, PA-C Follow up on 01/29/2018.   Specialties:  Cardiology, Physician Assistant Why:  Please arrive 15 minutes early for your 9:45am appointment Contact information: [address] [place] [address] [phone]        [facility]. Go on 01/06/2018.   Why:  you have an appointment scheduled at 10:45 am. If this appointment does not work for your  schedule, please call to reschedule. Please ask about the orange card at that appointment.   Contact information: [address] [place] [address] [phone]       [facility] DEPARTMENT OF PHARMACY .   Contact information: [address] [id number] mc [place] [address] [phone]       [facility] outpatient pharmacy Follow up.   Why:  Please take MATCH letter with you to the above pharmacy to fill your discharging home medications Contact  information: [address] [place] [address] [phone]          Hospital Course by problem list: Principal Problem:   Acute pericarditis associated with systemic lupus erythematosus (SLE) (HCC) Active Problems:   Pericardial effusion   Pleuritis   1. Acute Lupus Pericarditis, Pericardial Effusion, Pleuritis: Ms. Lorson presented to the [facility] with a chief complaint of chest pain. CTA of chest was negative for PE, but did show small pericardial effusion and associated serosal thickening consistent with pericarditis, and trace left pleural effusion. EKG with non-specific ST changes. Serial troponin level negative. Cardiology was consulted and started the patient on colchicine and prednisone for pericarditis. Echocardiogram showed normal systolic function and re-demonstrated pericardial effusion without tamponade features. ANA with reflex came back positive for ANA, Anti-Smith, Anti-dsDNA, and a few others as below. These findings in the setting of pericarditis and serositis with history of anemia are consistent with Lupus. Patient was continued on Prednisone taper though this was extended to 40mg  for 7d with 10mg  per week taper. She was started on Hydroxychloroquine, Colchicine, and 2 weeks of TID Ibuprofen to be follow by taper. Patient also provided referral to rheumatology.  Discharge Vitals:   BP 125/79 (BP Location: Left Arm)   Pulse (!) 57   Temp 97.6 F (36.4 C) (Oral)   Resp 17   Ht 5\' 1"  (1.549 m)   Wt 154 lb (69.9 kg)   LMP 12/20/2017   SpO2 100%   BMI 29.10 kg/m   Pertinent Labs, Studies, and Procedures:  CBC Latest Ref Rng & Units 12/29/2017 12/28/2017 12/27/2017  WBC 4.0 - 10.5 K/uL 10.4 11.4(H) 16.1(H)  Hemoglobin 12.0 - 15.0 g/dL 10.3(L) 10.5(L) 11.2(L)  Hematocrit 36.0 - 46.0 % 31.5(L) 32.0(L) 33.9(L)  Platelets 150 - 400 K/uL 365 327 379   BMP Latest Ref Rng & Units 12/29/2017 12/28/2017 12/27/2017  Glucose 65 - 99 mg/dL 536(U) 440(H) -  BUN 6 - 20 mg/dL 8  8 -  Creatinine 4.74 - 1.00 mg/dL 2.59 5.63 8.75  Sodium 135 - 145 mmol/L 135 135 -  Potassium 3.5 - 5.1 mmol/L 3.8 3.4(L) -  Chloride 101 - 111 mmol/L 103 104 -  CO2 22 - 32 mmol/L 24 23 -  Calcium 8.9 - 10.3 mg/dL 6.4(P) 8.3(L) -   ANA Positive  Ref Range & Units 3d ago  ds DNA Ab 0 - 9 IU/mL 80High    Comment: (NOTE)                   Negative   <5                   Equivocal 5 - 9                   Positive   >9   Ribonucleic Protein 0.0 - 0.9 AI >8.0High    ENA SM Ab Ser-aCnc 0.0 - 0.9 AI 1.5High    Scleroderma (Scl-70) (ENA) Antibody,  IgG 0.0 - 0.9 AI <0.2   SSA (Ro) (ENA) Antibody, IgG 0.0 - 0.9 AI 2.0High    SSB (La) (ENA) Antibody, IgG 0.0 - 0.9 AI <0.2   Chromatin Ab SerPl-aCnc 0.0 - 0.9 AI >8.0High    Anti JO-1 0.0 - 0.9 AI <0.2   Centromere Ab Screen 0.0 - 0.9 AI <0.2    EKG:  EKG Interpretation  Date/Time:  Thursday December 26 2017 19:42:34 EDT Ventricular Rate:  104 PR Interval:  168 QRS Duration: 82 QT Interval:  498 QTC Calculation: 654 R Axis:   91 Text Interpretation:  SR diffuse ST flattening similar to previous except increase lateral Twave flattening. (machine read of QTc appears erroneosly prolonged due to Twave flattening) Confirmed by Arby Barrette [phone]) on 12/27/2017 7:53:49 AM      Echocardiogram: Study Conclusions - Left ventricle: The cavity size was normal. Wall thickness was normal. Systolic function was normal. The estimated ejection fraction was in the range of 60% to 65%. Wall motion was normal; there were no regional wall motion abnormalities. Left ventricular diastolic function parameters were normal. Mitral valve: Mildly thickened leaflets. There was trivial regurgitation. - Left atrium: The atrium was normal in size. - Pericardium, extracardiac: A small pericardial effusion was identified. Features were not consistent with tamponade  physiology.  DG Chest 2 View:  IMPRESSION: Cardiomegaly and pulmonary vascular congestion.  CTA (PE Protocol): IMPRESSION: 1. Small to moderate pericardial effusion. Although limited by CTA timing, suspect there is associated serosal thickening and underlying pericarditis. 2. Trace left pleural effusion, possibly from serositis. 3. Atelectasis. 4. Negative for pulmonary embolism.  Discharge Instructions: Discharge Instructions    Ambulatory referral to Rheumatology   Complete by:  As directed    ANA with reflex showed positive ANA, Anti ds-DNA, and Anti Smith Antibodies. In the setting of serositis, results consistent with lupus erythromatosis.   Call MD for:  difficulty breathing, headache or visual disturbances   Complete by:  As directed    Call MD for:  persistant dizziness or light-headedness   Complete by:  As directed    Call MD for:  persistant nausea and vomiting   Complete by:  As directed    Diet - low sodium heart healthy   Complete by:  As directed    Discharge instructions   Complete by:  As directed    Thank you for allowing [place] to care for you  Your symptoms are believed to be due to pericarditis caused by lupus:  Please take: - Hydroxyxhloroquine (Plaquinil) 200mg  Daily - Colchicine 0.6mg  Twice a day for 3 months - Prednisone 40mg  Daily for 3 more days, then 30mg  Daily for 7 days, then 20mg  Daily for 7d, then 10mg  daily for 7d - Ibuprofen 600mg  three times a day for 2 weeks, then 400mg  three times a day for 1 week, then 200mg  three times a day for 1 week  You have been prescribed a 7 day supply of pain medication to be taken no more frequently than every 4 hours as needed  We have ordered a referral for you to see a Rheumatologist (specialist who works with lupus)  Please follow up with [place] [facility] on 01/06/2018 at 10:45, Phone #: [phone] - Please ask about the "[employer] Card" financial assistance program  Please follow up with Cardiology as scheduled on 01/29/18, You  will be receiving a repeat ultrasound of your heart to monitor for fluid.      Signed: Beola Cord, MD  12/31/2017, 1:40 PM   Pager: [phone]

## 2017-12-29 DIAGNOSIS — R0781 Pleurodynia: Secondary | ICD-10-CM

## 2017-12-29 DIAGNOSIS — Z79899 Other long term (current) drug therapy: Secondary | ICD-10-CM

## 2017-12-29 DIAGNOSIS — K219 Gastro-esophageal reflux disease without esophagitis: Secondary | ICD-10-CM

## 2017-12-29 LAB — BASIC METABOLIC PANEL
ANION GAP: 8 (ref 5–15)
BUN: 8 mg/dL (ref 6–20)
CHLORIDE: 103 mmol/L (ref 101–111)
CO2: 24 mmol/L (ref 22–32)
CREATININE: 0.65 mg/dL (ref 0.44–1.00)
Calcium: 8.7 mg/dL — ABNORMAL LOW (ref 8.9–10.3)
GFR calc non Af Amer: >60 mL/min (ref >60)
Glucose, Bld: 125 mg/dL — ABNORMAL HIGH (ref 65–99)
POTASSIUM: 3.8 mmol/L (ref 3.5–5.1)
SODIUM: 135 mmol/L (ref 135–145)

## 2017-12-29 LAB — CBC
HEMATOCRIT: 31.5 % — AB (ref 36.0–46.0)
HEMOGLOBIN: 10.3 g/dL — AB (ref 12.0–15.0)
MCH: 27.6 pg (ref 26.0–34.0)
MCHC: 32.7 g/dL (ref 30.0–36.0)
MCV: 84.5 fL (ref 78.0–100.0)
Platelets: 365 10*3/uL (ref 150–400)
RBC: 3.73 MIL/uL — AB (ref 3.87–5.11)
RDW: 13.9 % (ref 11.5–15.5)
WBC: 10.4 10*3/uL (ref 4.0–10.5)

## 2017-12-29 LAB — CULTURE, GROUP A STREP (THRC)

## 2017-12-29 MED ORDER — MORPHINE SULFATE (PF) 2 MG/ML IV SOLN
2.0000 mg | INTRAVENOUS | Status: DC | PRN
  Administered 2017-12-29 – 2017-12-30 (×5): 2 mg via INTRAVENOUS
  Filled 2017-12-29 (×6): qty 1

## 2017-12-29 MED ORDER — DICLOFENAC SODIUM 1 % TD GEL
2.0000 g | Freq: Four times a day (QID) | TRANSDERMAL | Status: DC
  Administered 2017-12-29 (×3): 2 g via TOPICAL
  Filled 2017-12-29: qty 100

## 2017-12-29 NOTE — Progress Notes (Signed)
Patient in severe pain 9/10. RN administered toradol as ordered. Unable to give IV morphine, last given at 0645. Pt states that she does not feel as though toradol is helping her pain. MD paged. RN will continue to monitor.

## 2017-12-29 NOTE — Progress Notes (Signed)
DAILY PROGRESS NOTE   Patient Name: Diana Huynh Date of Encounter: 12/29/2017  Chief Complaint   Left-sided chest pain overnight, febrile  Patient Profile   Diana Huynh is a 37 y.o. female with a history of recent viral illness reports to [facility] with chest pain. Cardiology was asked to consult for chest pain at the request of Dr. Johnney Killian.  Subjective   More pain overnight. HR improved today. Tmax was lower at 100.4. Prednisone started yesterday. Also on colchicine and toradol for pain. ANA pending.  Objective   Vitals:   12/28/17 1941 12/28/17 2353 12/29/17 0357 12/29/17 0731  BP: 113/72 99/87  123/74  Pulse: 91 75 77 67  Resp: (!) _0 Temp: 98.7 F (37.1 C) 98.2 F (36.8 C) 97.8 F (36.6 C) 97.9 F (36.6 C)  TempSrc: Oral Oral Oral Oral  SpO2: 100% 100% 100% 100%  Weight:   155 lb 10.3 oz (70.6 kg)   Height:        Intake/Output Summary (Last 24 hours) at 12/29/2017 1031 Last data filed at 12/28/2017 1320 Gross per 24 hour  Intake 240 ml  Output -  Net 240 ml   Filed Weights   12/27/17 1859 12/28/17 0300 12/29/17 0357  Weight: 150 lb 5.7 oz (68.2 kg) 152 lb 8.9 oz (69.2 kg) 155 lb 10.3 oz (70.6 kg)    Physical Exam   General appearance: sedate, but awake, no distress Neck: no carotid bruit, no JVD and thyroid not enlarged, symmetric, no tenderness/mass/nodules Lungs: clear to auscultation bilaterally Heart: regular rate and rhythm, S1, S2 normal and no rub Abdomen: soft, non-tender; bowel sounds normal; no masses,  no organomegaly Extremities: extremities normal, atraumatic, no cyanosis or edema Pulses: 2+ and symmetric Skin: Skin color, texture, turgor normal. No rashes or lesions Neurologic: Grossly normal Psych: Pleasant  Inpatient Medications    Scheduled Meds: . aspirin EC  81 mg Oral Daily  . colchicine  0.6 mg Oral BID  . enoxaparin (LOVENOX) injection  40 mg Subcutaneous Q24H  . pantoprazole  40 mg Oral Daily  . predniSONE  40 mg  Oral Q breakfast  . ramelteon  8 mg Oral QHS    Continuous Infusions:   PRN Meds: acetaminophen **OR** acetaminophen, alum & mag hydroxide-simeth, ketorolac, morphine injection, ondansetron **OR** ondansetron (ZOFRAN) IV, senna-docusate   Labs   Results for orders placed or performed during the hospital encounter of 12/27/17 (from the past 48 hour(s))  C-reactive protein     Status: Abnormal   Collection Time: 12/27/17  1:41 PM  Result Value Ref Range   CRP 8.6 (H) <1.0 mg/dL    Comment: Performed at [facility] [address] [place], [place] [address]  Blood culture (routine x 2)     Status: None (Preliminary result)   Collection Time: 12/27/17  2:00 PM  Result Value Ref Range   Specimen Description BLOOD RIGHT ANTECUBITAL    Special Requests      BOTTLES DRAWN AEROBIC AND ANAEROBIC Blood Culture adequate volume   Culture      NO GROWTH < 24 HOURS Performed at [facility] [address], [place], [place] [address]    Report Status PENDING   Rapid strep screen     Status: None   Collection Time: 12/27/17  2:03 PM  Result Value Ref Range   Streptococcus, Group A Screen (Direct) NEGATIVE NEGATIVE    Comment: (NOTE) A Rapid Antigen test may result negative if  the antigen level in the sample is below the detection level of this test. The FDA has not cleared this test as a stand-alone test therefore the rapid antigen negative result has reflexed to a Group A Strep culture. Performed at [facility], [place] [address] [place], [place] [address]   Antistreptolysin O titer     Status: Abnormal   Collection Time: 12/27/17  2:03 PM  Result Value Ref Range   ASO 237.0 (H) 0.0 - 200.0 IU/mL    Comment: (NOTE) Performed At: [facility] [place], [place] [id number] Rush Farmer MD [id number] Performed at [facility], [place] [address] [place], [place] [address]   Respiratory Panel by PCR     Status: None   Collection Time:  12/27/17  2:03 PM  Result Value Ref Range   Adenovirus NOT DETECTED NOT DETECTED   Coronavirus 229E NOT DETECTED NOT DETECTED   Coronavirus HKU1 NOT DETECTED NOT DETECTED   Coronavirus NL63 NOT DETECTED NOT DETECTED   Coronavirus OC43 NOT DETECTED NOT DETECTED   Metapneumovirus NOT DETECTED NOT DETECTED   Rhinovirus / Enterovirus NOT DETECTED NOT DETECTED   Influenza A NOT DETECTED NOT DETECTED   Influenza B NOT DETECTED NOT DETECTED   Parainfluenza Virus 1 NOT DETECTED NOT DETECTED   Parainfluenza Virus 2 NOT DETECTED NOT DETECTED   Parainfluenza Virus 3 NOT DETECTED NOT DETECTED   Parainfluenza Virus 4 NOT DETECTED NOT DETECTED   Respiratory Syncytial Virus NOT DETECTED NOT DETECTED   Bordetella pertussis NOT DETECTED NOT DETECTED   Chlamydophila pneumoniae NOT DETECTED NOT DETECTED   Mycoplasma pneumoniae NOT DETECTED NOT DETECTED    Comment: Performed at [facility] [address] [place], [place] [address]  Culture, group A strep     Status: None (Preliminary result)   Collection Time: 12/27/17  2:03 PM  Result Value Ref Range   Specimen Description THROAT    Special Requests NONE Reflexed from [id number]    Culture      CULTURE REINCUBATED FOR BETTER GROWTH Performed at [facility], [place] [address], [place], [place] [address]    Report Status PENDING   Blood culture (routine x 2)     Status: None (Preliminary result)   Collection Time: 12/27/17  2:12 PM  Result Value Ref Range   Specimen Description BLOOD LEFT ANTECUBITAL    Special Requests      BOTTLES DRAWN AEROBIC AND ANAEROBIC Blood Culture adequate volume   Culture      NO GROWTH < 24 HOURS Performed at [facility], [place] [address], [place], [place] [address]    Report Status PENDING   HIV antibody (Routine Testing)     Status: None   Collection Time: 12/27/17  5:06 PM  Result Value Ref Range   HIV Screen 4th Generation wRfx Non Reactive Non Reactive    Comment: (NOTE) Performed At: [facility] [place], [place] [id number] Rush Farmer MD [id number] Performed at [facility], [place] [address] [facility] [address] [place], [place] [address]   Troponin I     Status: None   Collection Time: 12/27/17  5:06 PM  Result Value Ref Range   Troponin I <0.03 <0.03 ng/mL    Comment: Performed at [facility] [address] [place], [place] [address]  Hemoglobin A1c     Status: None   Collection Time: 12/27/17  5:06 PM  Result Value Ref Range  Hgb A1c MFr Bld 5.3 4.8 - 5.6 %    Comment: (NOTE) Pre diabetes:          5.7%-6.4% Diabetes:              >6.4% Glycemic control for   <7.0% adults with diabetes    Mean Plasma Glucose 105.41 mg/dL    Comment: Performed at [facility] [address] [place], [place] [address]  Lipid panel     Status: Abnormal   Collection Time: 12/27/17  5:06 PM  Result Value Ref Range   Cholesterol 119 0 - 200 mg/dL   Triglycerides 41 <150 mg/dL   HDL 31 (L) >40 mg/dL   Total CHOL/HDL Ratio 3.8 RATIO   VLDL 8 0 - 40 mg/dL   LDL Cholesterol 80 0 - 99 mg/dL    Comment:        Total Cholesterol/HDL:CHD Risk Coronary Heart Disease Risk Table                     Men   Women  1/2 Average Risk   3.4   3.3  Average Risk       5.0   4.4  2 X Average Risk   9.6   7.1  3 X Average Risk  23.4   11.0        Use the calculated Patient Ratio above and the CHD Risk Table to determine the patient's CHD Risk.        ATP III CLASSIFICATION (LDL):  <100     mg/dL   Optimal  100-129  mg/dL   Near or Above                    Optimal  130-159  mg/dL   Borderline  160-189  mg/dL   High  >190     mg/dL   Very High Performed at [facility] [address], [place], [place] [address]   Creatinine, serum     Status: None   Collection Time: 12/27/17  5:06 PM  Result Value Ref Range   Creatinine, Ser 0.80 0.44 - 1.00 mg/dL   GFR calc non Af Amer >60 >60 mL/min   GFR calc Af Amer >60 >60 mL/min    Comment: (NOTE) The  eGFR has been calculated using the CKD EPI equation. This calculation has not been validated in all clinical situations. eGFR's persistently <60 mL/min signify possible Chronic Kidney Disease. Performed at [facility], [place] [address] [place], [place] [address]   CBC     Status: Abnormal   Collection Time: 12/27/17  5:06 PM  Result Value Ref Range   WBC 16.1 (H) 4.0 - 10.5 K/uL   RBC 3.98 3.87 - 5.11 MIL/uL   Hemoglobin 11.2 (L) 12.0 - 15.0 g/dL   HCT 33.9 (L) 36.0 - 46.0 %   MCV 85.2 78.0 - 100.0 fL   MCH 28.1 26.0 - 34.0 pg   MCHC 33.0 30.0 - 36.0 g/dL   RDW 14.2 11.5 - 15.5 %   Platelets 379 150 - 400 K/uL    Comment: Performed at [facility] [address] [place], [place] [address]  MRSA PCR Screening     Status: None   Collection Time: 12/27/17  8:11 PM  Result Value Ref Range   MRSA by PCR NEGATIVE NEGATIVE    Comment:        The GeneXpert MRSA Assay (FDA  approved for NASAL specimens only), is one component of a comprehensive MRSA colonization surveillance program. It is not intended to diagnose MRSA infection nor to guide or monitor treatment for MRSA infections. Performed at [facility], [place] [address] [place], [place] [address]   Troponin I     Status: None   Collection Time: 12/27/17 10:03 PM  Result Value Ref Range   Troponin I <0.03 <0.03 ng/mL    Comment: Performed at [facility] [address], [place], [place] [address]  Troponin I     Status: None   Collection Time: 12/28/17  4:21 AM  Result Value Ref Range   Troponin I <0.03 <0.03 ng/mL    Comment: Performed at [facility] [address], [place], [place] [address]  Comprehensive metabolic panel     Status: Abnormal   Collection Time: 12/28/17  4:21 AM  Result Value Ref Range   Sodium 135 135 - 145 mmol/L   Potassium 3.4 (L) 3.5 - 5.1 mmol/L   Chloride 104 101 - 111 mmol/L   CO2 23 22 - 32 mmol/L   Glucose, Bld 115 (H) 65 - 99 mg/dL   BUN 8 6 - 20 mg/dL    Creatinine, Ser 0.75 0.44 - 1.00 mg/dL   Calcium 8.3 (L) 8.9 - 10.3 mg/dL   Total Protein 7.3 6.5 - 8.1 g/dL   Albumin 3.0 (L) 3.5 - 5.0 g/dL   AST 16 15 - 41 U/L   ALT 16 14 - 54 U/L   Alkaline Phosphatase 49 38 - 126 U/L   Total Bilirubin 0.7 0.3 - 1.2 mg/dL   GFR calc non Af Amer >60 >60 mL/min   GFR calc Af Amer >60 >60 mL/min    Comment: (NOTE) The eGFR has been calculated using the CKD EPI equation. This calculation has not been validated in all clinical situations. eGFR's persistently <60 mL/min signify possible Chronic Kidney Disease.    Anion gap 8 5 - 15    Comment: Performed at [facility] [address], [place], [place] [address]  CBC     Status: Abnormal   Collection Time: 12/28/17  4:21 AM  Result Value Ref Range   WBC 11.4 (H) 4.0 - 10.5 K/uL   RBC 3.75 (L) 3.87 - 5.11 MIL/uL   Hemoglobin 10.5 (L) 12.0 - 15.0 g/dL   HCT 32.0 (L) 36.0 - 46.0 %   MCV 85.3 78.0 - 100.0 fL   MCH 28.0 26.0 - 34.0 pg   MCHC 32.8 30.0 - 36.0 g/dL   RDW 14.2 11.5 - 15.5 %   Platelets 327 150 - 400 K/uL    Comment: Performed at [facility], [place] [address] [place], [place] [address]  Glucose, capillary     Status: Abnormal   Collection Time: 12/28/17 11:56 PM  Result Value Ref Range   Glucose-Capillary 139 (H) 65 - 99 mg/dL   Comment 1 Notify RN   CBC     Status: Abnormal   Collection Time: 12/29/17  2:10 AM  Result Value Ref Range   WBC 10.4 4.0 - 10.5 K/uL   RBC 3.73 (L) 3.87 - 5.11 MIL/uL   Hemoglobin 10.3 (L) 12.0 - 15.0 g/dL   HCT 31.5 (L) 36.0 - 46.0 %   MCV 84.5 78.0 - 100.0 fL   MCH 27.6 26.0 - 34.0 pg   MCHC 32.7 30.0 - 36.0 g/dL   RDW 13.9 11.5 - 15.5 %  Platelets 365 150 - 400 K/uL    Comment: Performed at [facility], [place] [address], [place], [place] [address]  Basic metabolic panel     Status: Abnormal   Collection Time: 12/29/17  2:10 AM  Result Value Ref Range   Sodium 135 135 - 145 mmol/L   Potassium 3.8 3.5 - 5.1 mmol/L   Chloride  103 101 - 111 mmol/L   CO2 24 22 - 32 mmol/L   Glucose, Bld 125 (H) 65 - 99 mg/dL   BUN 8 6 - 20 mg/dL   Creatinine, Ser 0.65 0.44 - 1.00 mg/dL   Calcium 8.7 (L) 8.9 - 10.3 mg/dL   GFR calc non Af Amer >60 >60 mL/min   GFR calc Af Amer >60 >60 mL/min    Comment: (NOTE) The eGFR has been calculated using the CKD EPI equation. This calculation has not been validated in all clinical situations. eGFR's persistently <60 mL/min signify possible Chronic Kidney Disease.    Anion gap 8 5 - 15    Comment: Performed at [facility] [address] [place], [place] [address]    ECG   Sinus tachy at 104 (3/29) - Personally Reviewed  Telemetry   Sinus rhythm - Personally Reviewed  Radiology    No results found.  Cardiac Studies   LV EF: 60% -   65%  ------------------------------------------------------------------- Indications:      Pericardial effusion 423.9.  ------------------------------------------------------------------- History:   PMH:  Productive cough. Chest pain. Upper back pain since 5:00pm yesterday. Pain with breathing.  Dyspnea.  ------------------------------------------------------------------- Study Conclusions  - Left ventricle: The cavity size was normal. Wall thickness was   normal. Systolic function was normal. The estimated ejection   fraction was in the range of 60% to 65%. Wall motion was normal;   there were no regional wall motion abnormalities. Left   ventricular diastolic function parameters were normal. - Mitral valve: Mildly thickened leaflets . There was trivial   regurgitation. - Left atrium: The atrium was normal in size. - Pericardium, extracardiac: A small pericardial effusion was   identified. Features were not consistent with tamponade   physiology.  Impressions:  - LVEF 60-65%, normal wall thickness, normal wall motion, normal   diastolic function, trivial MR, normal LA size, small pericardial   effusion without  tamponade features.  ------------------------------------------------------------------- Labs, prior tests, procedures, and surgery: CT of chest which showed signs of pericarditis and a small to moderate sized pericardial effusion.  Assessment   Principal Problem:   Pericarditis, acute Active Problems:   Pericardial effusion   Plan   1. Ms. January continues to have pain, but toradol has not provided relief. I'm hoping the prednisone will kick-in more today (second dose). Continue colchicine. ANA pending to screen for autoimmune etiology. If negative, may be more likely viral. BP stable, no tamponade features. Would repeat an echo as outpatient in 2-4 weeks. Her pain is somewhat out of proportion to what we usually see with pericarditis and it usually improves quickly with steroids - would consider other causes on the differential for her pain.  Time Spent Directly with Patient:  I have spent a total of 15 minutes with the patient reviewing hospital notes, telemetry, EKGs, labs and examining the patient as well as establishing an assessment and plan that was discussed personally with the patient. > 50% of time was spent in direct patient care.  Length of Stay:  LOS: 2 days   Pixie Casino, MD, [facility],  [place] Director of the [facility]  Card[facility]ovascular Risk [facility] Diplomate of the American Board of Clinical Lipidology Attending Cardiologist  Direct Dial: [phone]  Fax: [phone]  Website:  [email or web address] 12/29/2017, 10:31 AM

## 2017-12-29 NOTE — Progress Notes (Signed)
   Subjective: Past feels that her pain is improved from the time of discharge but has not yet resolved.   The pain as a squeezing feeling in her left chest which is worse when she stands up to walk.  It is not associated with nausea, diaphoresis or palpitations.   She feels that the morphine 2 mg is helping to allow her to get out of bed but stops working after about 2 hours.  Has noticed only mild improvement with the Toradol and has not yet noticed any improvement since beginning the steroids. She does note that there is a component of chest wall tenderness  She has been eating well.  Objective:  Vital signs in last 24 hours: Vitals:   12/29/17 0357 12/29/17 0731 12/29/17 1113 12/29/17 1602  BP:  123/74 109/71 118/64  Pulse: 77 67 70 66  Resp: 16 18 19 18   Temp: 97.8 F (36.6 C) 97.9 F (36.6 C) 98.1 F (36.7 C) (!) 97.3 F (36.3 C)  TempSrc: Oral Oral Oral Axillary  SpO2: 100% 100% 100% 100%  Weight: 155 lb 10.3 oz (70.6 kg)     Height:       General: No acute distress, appears uncomfortable Cardiac: There is a action rub, normal rate regular rhythm, no murmurs appreciated, no peripheral edema Pulmonary: No acute distress, lungs clear to auscultation GI: Bowel sounds present, soft, nontender, nondistended   Assessment/Plan:  This is a 37 year old female with medical history of GERD who was admitted 3/29 for pleuritic chest pain following a viral URI.    Pericarditis, acute - positive urine ASO with negative respiratory viral panel and strep throat swab -Follow-up ANA -Continue morphine 2 mg with increased frequency now every 2 hours -To new Toradol 30 mg every 6 hours as needed -Continue prednisone 40 mg daily - continue colchicine 0.6 mg BID  - trial of voltaren gel for chest wall pain  - repeat echo 2-4 weeks as an outpatient.   GERD  - protonix 40 mg daily    Dispo: Anticipated discharge in approximately 1-2 day(s).   Eulah PontBlum, Etola Mull, MD 12/29/2017, 4:55 PM Pager:  [phone]

## 2017-12-30 DIAGNOSIS — Z7952 Long term (current) use of systemic steroids: Secondary | ICD-10-CM

## 2017-12-30 DIAGNOSIS — I301 Infective pericarditis: Secondary | ICD-10-CM

## 2017-12-30 LAB — ENA+DNA/DS+ANTICH+CENTRO+JO...
Chromatin Ab SerPl-aCnc: >8 AI — ABNORMAL HIGH (ref 0.0–0.9)
DS DNA AB: 80 [IU]/mL — AB (ref 0–9)
ENA SM Ab Ser-aCnc: 1.5 AI — ABNORMAL HIGH (ref 0.0–0.9)
Ribonucleic Protein: >8 AI — ABNORMAL HIGH (ref 0.0–0.9)
SSA (Ro) (ENA) Antibody, IgG: 2 AI — ABNORMAL HIGH (ref 0.0–0.9)
SSB (La) (ENA) Antibody, IgG: <0.2 AI (ref 0.0–0.9)
Scleroderma (Scl-70) (ENA) Antibody, IgG: <0.2 AI (ref 0.0–0.9)

## 2017-12-30 LAB — ANA W/REFLEX IF POSITIVE: Anti Nuclear Antibody(ANA): POSITIVE — AB

## 2017-12-30 MED ORDER — INDOMETHACIN 25 MG PO CAPS
50.0000 mg | ORAL_CAPSULE | Freq: Two times a day (BID) | ORAL | Status: DC
  Administered 2017-12-30 – 2017-12-31 (×2): 50 mg via ORAL
  Filled 2017-12-30 (×2): qty 2

## 2017-12-30 MED ORDER — HYDROCODONE-ACETAMINOPHEN 5-325 MG PO TABS
1.0000 | ORAL_TABLET | Freq: Four times a day (QID) | ORAL | Status: DC | PRN
  Administered 2017-12-30 – 2017-12-31 (×3): 1 via ORAL
  Filled 2017-12-30 (×3): qty 1

## 2017-12-30 MED ORDER — MORPHINE SULFATE (PF) 2 MG/ML IV SOLN
2.0000 mg | INTRAVENOUS | Status: DC | PRN
  Administered 2017-12-31: 2 mg via INTRAVENOUS
  Filled 2017-12-30: qty 1

## 2017-12-30 NOTE — Progress Notes (Signed)
   Subjective: [name] was seen resting in her bed. She states that her pain has continued to improve since beginning steroids, but still becomes severe at times. She states she was able to space out her morphine for several extra hours this morning. She denies nausea, palpitation, or rash. She continues to have a component of chest wall tenderness.  Objective:  Vital signs in last 24 hours: Vitals:   12/29/17 2000 12/30/17 0000 12/30/17 0809 12/30/17 1058  BP: 116/72 108/83 117/76 112/76  Pulse: (!) 58 65 71 62  Resp: 14 12 19 20   Temp: 97.9 F (36.6 C) 97.9 F (36.6 C) 98.3 F (36.8 C) 97.6 F (36.4 C)  TempSrc: Oral Oral Oral Oral  SpO2: 100% 99% 100% 100%  Weight:      Height:       Physical Exam  Constitutional: She appears well-developed and well-nourished.  Moderate discomfort  Cardiovascular: Normal rate, regular rhythm and normal heart sounds.  Pulmonary/Chest: Effort normal and breath sounds normal.  Abdominal: Soft. Bowel sounds are normal. She exhibits no distension. There is no tenderness.  Musculoskeletal: She exhibits no edema or deformity.  Skin: Skin is warm and dry.   Assessment/Plan: 37 year old female with medical history of GERD who was admitted 3/29 for pleuritic chest pain following a viral URI.  Acute Pericarditis - CT demonstrated small pericardial effusion and associated serosal thickening consistent with pericarditis. Echo showed EF 60-65% with small pericardial effusion. - Appreciate Cardiology recommendations - Viral Pericarditis supsected: Positive urine ASO with negative respiratory viral panel and strep throat swab - ANA with reflex ordered to evaluate for autoimmune etiology  - Morphine 2mg  q4h (decreased from q2h) PRN severe pain - Norco 5-325 q6h PRN Moderate pain - Continue colchicine 0.6 mg BID - Continue prednisone 40 mg daily (Day 3) (Taper: 40mg  x4d, 20mg  x4d, 10mg  x4d, 5mg  x2d) - Repeat echo 3-4 weeks as an outpatient.   GERD  -  Protonix 40mg  Daily   Dispo: Anticipated discharge in approximately 1-2 day(s).   Beola CordMelvin, Alexander, MD 12/30/2017, 3:06 PM Pager: [phone]

## 2017-12-30 NOTE — Progress Notes (Signed)
Progress Note  Patient Name: Diana Huynh List Date of Encounter: 12/30/2017  Primary Cardiologist: Kristeen MissPhilip Nahser, MD   Subjective   Still with CP (increases with inspiration and certain movements) but improved; mild dyspnea  Inpatient Medications    Scheduled Meds: . aspirin EC  81 mg Oral Daily  . colchicine  0.6 mg Oral BID  . diclofenac sodium  2 g Topical QID  . enoxaparin (LOVENOX) injection  40 mg Subcutaneous Q24H  . pantoprazole  40 mg Oral Daily  . predniSONE  40 mg Oral Q breakfast  . ramelteon  8 mg Oral QHS   Continuous Infusions:  PRN Meds: acetaminophen **OR** acetaminophen, alum & mag hydroxide-simeth, ketorolac, morphine injection, ondansetron **OR** ondansetron (ZOFRAN) IV, senna-docusate   Vital Signs    Vitals:   12/29/17 1113 12/29/17 1602 12/29/17 2000 12/30/17 0000  BP: 109/71 118/64 116/72 108/83  Pulse: 70 66 (!) 58 65  Resp: 19 18 14 12   Temp: 98.1 F (36.7 C) (!) 97.3 F (36.3 C) 97.9 F (36.6 C) 97.9 F (36.6 C)  TempSrc: Oral Axillary Oral Oral  SpO2: 100% 100% 100% 99%  Weight:      Height:        Intake/Output Summary (Last 24 hours) at 12/30/2017 0821 Last data filed at 12/29/2017 1845 Gross per 24 hour  Intake 720 ml  Output 300 ml  Net 420 ml   Filed Weights   12/27/17 1859 12/28/17 0300 12/29/17 0357  Weight: 150 lb 5.7 oz (68.2 kg) 152 lb 8.9 oz (69.2 kg) 155 lb 10.3 oz (70.6 kg)    Telemetry    Sinus- Personally Reviewed  Physical Exam   GEN: No acute distress.   Neck: No JVD Cardiac: RRR, no murmurs, rubs, or gallops.  Respiratory: Clear to auscultation bilaterally. GI: Soft, nontender, non-distended  MS: No edema Neuro:  Nonfocal  Psych: Normal affect   Labs    Chemistry Recent Labs  Lab 12/26/17 2010 12/27/17 1706 12/28/17 0421 12/29/17 0210  NA 132*  --  135 135  K 3.4*  --  3.4* 3.8  CL 99*  --  104 103  CO2 22  --  23 24  GLUCOSE 99  --  115* 125*  BUN 5*  --  8 8  CREATININE 0.69 0.80 0.75  0.65  CALCIUM 8.8*  --  8.3* 8.7*  PROT  --   --  7.3  --   ALBUMIN  --   --  3.0*  --   AST  --   --  16  --   ALT  --   --  16  --   ALKPHOS  --   --  49  --   BILITOT  --   --  0.7  --   GFRNONAA >60 >60 >60 >60  GFRAA >60 >60 >60 >60  ANIONGAP 11  --  8 8     Hematology Recent Labs  Lab 12/27/17 1706 12/28/17 0421 12/29/17 0210  WBC 16.1* 11.4* 10.4  RBC 3.98 3.75* 3.73*  HGB 11.2* 10.5* 10.3*  HCT 33.9* 32.0* 31.5*  MCV 85.2 85.3 84.5  MCH 28.1 28.0 27.6  MCHC 33.0 32.8 32.7  RDW 14.2 14.2 13.9  PLT 379 327 365    Cardiac Enzymes Recent Labs  Lab 12/27/17 1706 12/27/17 2203 12/28/17 0421  TROPONINI <0.03 <0.03 <0.03    Recent Labs  Lab 12/26/17 2029 12/27/17 0828  TROPIPOC 0.00 0.00     BNP Recent Labs  Lab 12/26/17 2010  BNP 28.6     DDimer  Recent Labs  Lab 12/27/17 0819  DDIMER 4.45*     Patient Profile     37 y.o. female with recent viral illness admitted with chest pain felt likely to be pericarditis.  Echocardiogram shows normal LV function and small pericardial effusion.  Assessment & Plan    1 pericarditis-patient presents with chest pain felt likely to be pericarditis.  Enzymes negative.  Echocardiogram as outlined.  Note some of her pain increases with certain arm movements as well.  There may be a musculoskeletal component.  She has been treated with colchicine and has been placed on a prednisone taper as outlined by Dr. Rennis Golden.  Would add nonsteroidal (indocin 50 mg BID for 7-10 days).   2 small pericardial effusion-plan follow-up echocardiogram in 4 weeks.  Patient can be discharged from a cardiac standpoint on medications as outlined.  Follow-up with Dr. Elease Hashimoto in 4 weeks.  For questions or updates, please contact [facility] Please consult [email or web address] for contact info under Cardiology/STEMI.      Signed, Olga Millers, MD  12/30/2017, 8:21 AM

## 2017-12-31 ENCOUNTER — Telehealth: Payer: Self-pay

## 2017-12-31 DIAGNOSIS — M3212 Pericarditis in systemic lupus erythematosus: Principal | ICD-10-CM

## 2017-12-31 DIAGNOSIS — R091 Pleurisy: Secondary | ICD-10-CM

## 2017-12-31 DIAGNOSIS — Z862 Personal history of diseases of the blood and blood-forming organs and certain disorders involving the immune mechanism: Secondary | ICD-10-CM

## 2017-12-31 DIAGNOSIS — M329 Systemic lupus erythematosus, unspecified: Secondary | ICD-10-CM | POA: Insufficient documentation

## 2017-12-31 DIAGNOSIS — Z791 Long term (current) use of non-steroidal anti-inflammatories (NSAID): Secondary | ICD-10-CM

## 2017-12-31 DIAGNOSIS — J90 Pleural effusion, not elsewhere classified: Secondary | ICD-10-CM

## 2017-12-31 HISTORY — DX: Pleurisy: R09.1

## 2017-12-31 LAB — URINALYSIS, COMPLETE (UACMP) WITH MICROSCOPIC
BILIRUBIN URINE: NEGATIVE
Bacteria, UA: NONE SEEN
Glucose, UA: NEGATIVE mg/dL
KETONES UR: NEGATIVE mg/dL
LEUKOCYTES UA: NEGATIVE
Nitrite: NEGATIVE
PROTEIN: NEGATIVE mg/dL
Specific Gravity, Urine: 1.028 (ref 1.005–1.030)
pH: 6 (ref 5.0–8.0)

## 2017-12-31 MED ORDER — MORPHINE SULFATE (PF) 2 MG/ML IV SOLN
1.0000 mg | Freq: Once | INTRAVENOUS | Status: AC
  Administered 2017-12-31: 1 mg via INTRAVENOUS

## 2017-12-31 MED ORDER — IBUPROFEN 600 MG PO TABS: 600.0000 mg | ORAL_TABLET | Freq: Three times a day (TID) | ORAL | 0 refills | Status: DC

## 2017-12-31 MED ORDER — PREDNISONE 10 MG PO TABS: ORAL_TABLET | ORAL | 0 refills | Status: DC

## 2017-12-31 MED ORDER — HYDROCODONE-ACETAMINOPHEN 5-325 MG PO TABS
1.0000 | ORAL_TABLET | Freq: Four times a day (QID) | ORAL | Status: DC | PRN
  Administered 2017-12-31: 1 via ORAL
  Filled 2017-12-31: qty 1

## 2017-12-31 MED ORDER — HYDROCODONE-ACETAMINOPHEN 5-325 MG PO TABS: 1.0000 | ORAL_TABLET | ORAL | 0 refills | Status: DC | PRN

## 2017-12-31 MED ORDER — MORPHINE SULFATE (PF) 2 MG/ML IV SOLN
INTRAVENOUS | Status: AC
  Filled 2017-12-31: qty 1

## 2017-12-31 MED ORDER — IBUPROFEN 200 MG PO TABS: 600.0000 mg | ORAL_TABLET | Freq: Three times a day (TID) | ORAL | Status: DC

## 2017-12-31 MED ORDER — COLCHICINE 0.6 MG PO TABS: 0.6000 mg | ORAL_TABLET | Freq: Two times a day (BID) | ORAL | 2 refills | Status: DC

## 2017-12-31 MED ORDER — HYDROXYCHLOROQUINE SULFATE 200 MG PO TABS
200.0000 mg | ORAL_TABLET | Freq: Every day | ORAL | Status: DC
  Administered 2017-12-31: 200 mg via ORAL
  Filled 2017-12-31: qty 1

## 2017-12-31 MED ORDER — HYDROXYCHLOROQUINE SULFATE 200 MG PO TABS: 200.0000 mg | ORAL_TABLET | Freq: Every day | ORAL | 0 refills | Status: DC

## 2017-12-31 MED FILL — HYDROXYCHLOROQUINE 200 MG: 200 | 30 days supply | Qty: 30 | Fill #0

## 2017-12-31 MED FILL — predniSONE 10 MG TABS: 10 | 10 days supply | Qty: 33 | Fill #0

## 2017-12-31 MED FILL — COLCHICINE 0.6 MG TABS: 0.6 | 30 days supply | Qty: 60 | Fill #0

## 2017-12-31 MED FILL — IBUPROFEN 600 MG TABLET: 600 | 14 days supply | Qty: 42 | Fill #0

## 2017-12-31 MED FILL — HYDROCODON-APAP 5-325: 5-325 | 5 days supply | Qty: 30 | Fill #0

## 2017-12-31 NOTE — Care Management Note (Addendum)
Case Management Note  Patient Details  Name: Oretha EllisShunoah Salvador MRN: [id number] Date of Birth: 07/25/1981  Subjective/Objective:         Pt admitted with CP           Action/Plan:   PTA independent from home.  Pt does not have PCP nor prescription coverage.  Pt has been set up with IM outpt clinic - CM discussed interim plan regarding medication assistance until getting orange card with IM attending.  Attending service will accept pt outpt and has already set up clinic appt.  Service verified that [facility] out[facility] pharmacy can provide Plaquenil for $4 and will confirm cost of Colchicine.  IM Clinic will ensure medications are affordable - service does not wish to have pt active with [facility]. Attending service to follow up with [facility] determining [facility] cost of Colchicine.   Expected Discharge Date:  12/31/17               Expected Discharge Plan:  Home/Self Care  In-House Referral:     Discharge planning Services  CM Consult  Post Acute Care Choice:    Choice offered to:     DME Arranged:    DME Agency:     HH Arranged:    HH Agency:     Status of Service:  Completed, signed off  If discussed at [employer] Length of Stay Meetings, dates discussed:    Additional Comments: Update:  Per attending [facility] requests MATCH letter (colchicine).  [facility] Cherylann ParrClaxton, Rudolph Dobler S, RN 12/31/2017, 11:30 AM

## 2017-12-31 NOTE — Telephone Encounter (Signed)
F/U hospital to est care per Dr. Obie DredgeBlum; pt appt 01/06/2018 1045am/NW

## 2017-12-31 NOTE — Progress Notes (Addendum)
   Subjective: [name] was seen resting in her bed this morning. She states that her pain continues to improve and she was able to sleep more overnight. She states that when she awoke however, her pain was severe and she required additional IV pain medication for relief. She states she has had a couple of the pain pills and this has been working well for her other than the one event this morning. She was informed of her lab results indicating lupus in the settting of her serosiitis and history of anemia. 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 She will be giving a referral to rheumatology. She has no other complaints at this time.  Objective:  Vital signs in last 24 hours: Vitals:   12/30/17 1058 12/30/17 1609 12/30/17 2027 12/31/17 0420  BP: 112/76 98/82 122/79 115/79  Pulse: 62 89 62 (!) 57  Resp: 20 19 14 14   Temp: 97.6 F (36.4 C) 98.2 F (36.8 C) 98 F (36.7 C)   TempSrc: Oral Oral Oral   SpO2: 100% 100% 94% 100%  Weight:      Height:       Physical Exam  Constitutional: She appears well-developed and well-nourished. No distress.  Mild-Moderate Discomfort  Cardiovascular: Normal rate, regular rhythm and normal heart sounds.  Pulmonary/Chest: Effort normal and breath sounds normal.  Mild chest wall tenderness Moderate pain with deep inspiration  Abdominal: Soft. Bowel sounds are normal. She exhibits no distension. There is no tenderness.  Musculoskeletal: She exhibits no edema or deformity.  Skin: Skin is warm and dry. No rash noted.   Assessment/Plan: 37 year old female with medical history of GERD who was admitted 3/29 for pleuritic chest pain following a viral URI.  Acute Pericarditis: CT demonstrated small pericardial effusion and associated serosal thickening consistent with pericarditis. Echo showed EF 60-65% with small pericardial effusion. Viral Pericarditis suspected  initially with recent viral illness; labs demonstrated positive urine ASO with negative respiratory viral panel and strep throat swab. ANA with reflex returned positive for ANA, Anti-DS-DNA, and Anti-Smith Antibodies. 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  - Discontinue Morphine - Norco 5-325 q6h PRN Moderate-Severe pain - Colchicine 0.6mg  BID, Will continue for 3 month course - Ibuprofen 600mg  TID, Will continue for 2 weeks then taper dose by 200mg  per week - Prednisone 40 mg daily (Day 4) (Taper: 40mg  x7d, 30mg  x7d, 20mg  x7d, 10mg  x7d) - Hydroxychloroquine 200mg  Daily - Repeat Echo in 4 wks as outpatient.   GERD  - Protonix 40mg  Daily   Dispo: Anticipated discharge later Today.   Diana CordMelvin, Alexander, MD 12/31/2017, 6:08 AM Pager: [phone]

## 2018-01-01 LAB — CULTURE, BLOOD (ROUTINE X 2)
CULTURE: NO GROWTH
Culture: NO GROWTH
SPECIAL REQUESTS: ADEQUATE
Special Requests: ADEQUATE

## 2018-01-01 LAB — C3 COMPLEMENT: C3 COMPLEMENT: 144 mg/dL (ref 82–167)

## 2018-01-01 LAB — C4 COMPLEMENT: COMPLEMENT C4, BODY FLUID: 21 mg/dL (ref 14–44)

## 2018-01-01 NOTE — Telephone Encounter (Signed)
Transition Care Management Follow-up Telephone Call   Date discharged? 12/31/2017   How have you been since you were released from the hospital? "pretty good"   Do you understand why you were in the hospital? yes   Do you understand the discharge instructions? yes   Where were you discharged to? Home   Items Reviewed:  Medications reviewed: yes  Allergies reviewed: yes  Dietary changes reviewed: no  Referrals reviewed: N/A   Functional Questionnaire:   Activities of Daily Living (ADLs):   She states they are independent in the following: performs all ADLs independently States they require assistance with the following: no assistance needed   Any transportation issues/concerns?: no   Any patient concerns? Yes-concerned about new Lupus Dx and being uninsured.  Pt informed about charity care and will be given info about it during upcoming visit-she is also trying to obtain medicaid.    Confirmed importance and date/time of follow-up visits scheduled yes  [facility] on [facility]/06/2018 @ 10:45am   Confirmed with patient if condition begins to worsen call PCP or go to the ER.  Patient was given the office number and encouraged to call back with question or concerns.  : yes

## 2018-01-04 ENCOUNTER — Emergency Department (HOSPITAL_COMMUNITY)
Admission: EM | Admit: 2018-01-04 | Discharge: 2018-01-05 | Disposition: A | Discharge status: Home / Self Care | Payer: Medicaid Other | Attending: Emergency Medicine | Admitting: Emergency Medicine

## 2018-01-04 ENCOUNTER — Encounter (HOSPITAL_COMMUNITY): Payer: Self-pay | Admitting: *Deleted

## 2018-01-04 ENCOUNTER — Other Ambulatory Visit: Payer: Self-pay

## 2018-01-04 DIAGNOSIS — R0602 Shortness of breath: Secondary | ICD-10-CM | POA: Diagnosis not present

## 2018-01-04 DIAGNOSIS — K5900 Constipation, unspecified: Secondary | ICD-10-CM

## 2018-01-04 DIAGNOSIS — R079 Chest pain, unspecified: Secondary | ICD-10-CM | POA: Insufficient documentation

## 2018-01-04 DIAGNOSIS — R109 Unspecified abdominal pain: Secondary | ICD-10-CM | POA: Insufficient documentation

## 2018-01-04 DIAGNOSIS — R110 Nausea: Secondary | ICD-10-CM | POA: Insufficient documentation

## 2018-01-04 DIAGNOSIS — F17200 Nicotine dependence, unspecified, uncomplicated: Secondary | ICD-10-CM | POA: Diagnosis not present

## 2018-01-04 DIAGNOSIS — M791 Myalgia, unspecified site: Secondary | ICD-10-CM

## 2018-01-04 LAB — URINALYSIS, ROUTINE W REFLEX MICROSCOPIC
BILIRUBIN URINE: NEGATIVE
GLUCOSE, UA: NEGATIVE mg/dL
HGB URINE DIPSTICK: NEGATIVE
Ketones, ur: NEGATIVE mg/dL
Leukocytes, UA: NEGATIVE
Nitrite: NEGATIVE
PROTEIN: NEGATIVE mg/dL
Specific Gravity, Urine: 1.031 — ABNORMAL HIGH (ref 1.005–1.030)
pH: 6 (ref 5.0–8.0)

## 2018-01-04 LAB — CBC
HCT: 31.7 % — ABNORMAL LOW (ref 36.0–46.0)
Hemoglobin: 10.3 g/dL — ABNORMAL LOW (ref 12.0–15.0)
MCH: 27.8 pg (ref 26.0–34.0)
MCHC: 32.5 g/dL (ref 30.0–36.0)
MCV: 85.4 fL (ref 78.0–100.0)
Platelets: 590 10*3/uL — ABNORMAL HIGH (ref 150–400)
RBC: 3.71 MIL/uL — AB (ref 3.87–5.11)
RDW: 15.2 % (ref 11.5–15.5)
WBC: 14.4 10*3/uL — ABNORMAL HIGH (ref 4.0–10.5)

## 2018-01-04 LAB — COMPREHENSIVE METABOLIC PANEL
ALBUMIN: 3.3 g/dL — AB (ref 3.5–5.0)
ALK PHOS: 51 U/L (ref 38–126)
ALT: 41 U/L (ref 14–54)
AST: 24 U/L (ref 15–41)
Anion gap: 9 (ref 5–15)
BUN: 13 mg/dL (ref 6–20)
CALCIUM: 8.7 mg/dL — AB (ref 8.9–10.3)
CHLORIDE: 103 mmol/L (ref 101–111)
CO2: 26 mmol/L (ref 22–32)
Creatinine, Ser: 0.72 mg/dL (ref 0.44–1.00)
GFR calc Af Amer: >60 mL/min (ref >60)
GFR calc non Af Amer: >60 mL/min (ref >60)
GLUCOSE: 100 mg/dL — AB (ref 65–99)
Potassium: 3.5 mmol/L (ref 3.5–5.1)
SODIUM: 138 mmol/L (ref 135–145)
Total Bilirubin: 0.3 mg/dL (ref 0.3–1.2)
Total Protein: 7.2 g/dL (ref 6.5–8.1)

## 2018-01-04 LAB — LIPASE, BLOOD: LIPASE: 28 U/L (ref 11–51)

## 2018-01-04 LAB — TROPONIN I: Troponin I: <0.03 ng/mL (ref <0.03)

## 2018-01-04 LAB — I-STAT BETA HCG BLOOD, ED (MC, WL, AP ONLY): I-stat hCG, quantitative: <5 m[IU]/mL (ref <5)

## 2018-01-04 NOTE — ED Triage Notes (Signed)
The pt has  abd pain and numerus other complaints cold cough  Body aches weakness she was just discharged from this hospital  With pericarditis  March 30.  No n v or diarrhes  No temp chills sweating  Chest pain

## 2018-01-05 ENCOUNTER — Emergency Department (HOSPITAL_COMMUNITY): Payer: Medicaid Other

## 2018-01-05 MED ORDER — HYDROMORPHONE HCL 1 MG/ML IJ SOLN
1.0000 mg | Freq: Once | INTRAMUSCULAR | Status: AC
  Administered 2018-01-05: 1 mg via INTRAVENOUS
  Filled 2018-01-05: qty 1

## 2018-01-05 MED ORDER — IOPAMIDOL (ISOVUE-300) INJECTION 61%
100.0000 mL | Freq: Once | INTRAVENOUS | Status: AC | PRN
  Administered 2018-01-05: 100 mL via INTRAVENOUS

## 2018-01-05 MED ORDER — ONDANSETRON 4 MG PO TBDP: 4.0000 mg | ORAL_TABLET | Freq: Four times a day (QID) | ORAL | 0 refills | Status: DC | PRN

## 2018-01-05 MED ORDER — ONDANSETRON HCL 4 MG/2ML IJ SOLN
4.0000 mg | Freq: Once | INTRAMUSCULAR | Status: AC
  Administered 2018-01-05: 4 mg via INTRAVENOUS
  Filled 2018-01-05: qty 2

## 2018-01-05 MED ORDER — IOPAMIDOL (ISOVUE-300) INJECTION 61%
INTRAVENOUS | Status: AC
  Filled 2018-01-05: qty 100

## 2018-01-05 NOTE — ED Provider Notes (Signed)
TIME SEEN: 12:40 AM  CHIEF COMPLAINT: Multiple complaints  HPI: Patient is a 37 year old female with recent diagnosis of lupus who was recently started on Plaquenil and prednisone as well as colchicine for recent pleural effusion and pericarditis who presents today to the emergency department with multiple complaints.  Patient reports that she is having pain all over especially in her abdomen which started today.  She also feels like she is having swelling in her hands, face and legs.  She has had a dry cough since her admission on March 29.  No known fevers or chills.  Has had nausea and decreased appetite.  No vomiting or diarrhea.  Reports taking 1 Vicodin tablet at 4 PM with no relief of pain.  Has an appointment to see a rheumatologist tomorrow.  ROS: See HPI Constitutional: no fever  Eyes: no drainage  ENT: no runny nose   Cardiovascular:   chest pain  Resp:  SOB  GI: no vomiting or diarrhea, + decreased appetite GU: no dysuria Integumentary: no rash  Allergy: no hives  Musculoskeletal: no leg swelling  Neurological: no slurred speech ROS otherwise negative  PAST MEDICAL HISTORY/PAST SURGICAL HISTORY:  Past Medical History:  Diagnosis Date  . Bronchitis     MEDICATIONS:  Prior to Admission medications   Medication Sig Start Date End Date Taking? Authorizing Provider  alum & mag hydroxide-simeth (MAALOX REGULAR STRENGTH) 200-200-20 MG/5ML suspension Take 30 mLs by mouth every 6 (six) hours as needed for indigestion or heartburn. 06/30/17   Nira Connardama, Pedro Eduardo, MD  colchicine 0.6 MG tablet Take 1 tablet (0.6 mg total) by mouth 2 (two) times daily. 12/31/17   Beola CordMelvin, Alexander, MD  famotidine (PEPCID) 20 MG tablet Take 1 tablet (20 mg total) by mouth 2 (two) times daily. Patient taking differently: Take 20 mg by mouth 2 (two) times daily as needed for heartburn or indigestion.  08/20/12   Ivonne Andrewammen, Peter, PA-C  HYDROcodone-acetaminophen (NORCO/VICODIN) 5-325 MG tablet Take 1 tablet  by mouth every 4 (four) hours as needed for up to 7 days for moderate pain or severe pain. 12/31/17 01/07/18  Beola CordMelvin, Alexander, MD  hydroxychloroquine (PLAQUENIL) 200 MG tablet Take 1 tablet (200 mg total) by mouth daily. 01/01/18   Beola CordMelvin, Alexander, MD  ibuprofen (ADVIL,MOTRIN) 600 MG tablet Take 1 tablet (600 mg total) by mouth 3 (three) times daily. 12/31/17   Beola CordMelvin, Alexander, MD  omeprazole (PRILOSEC) 20 MG capsule Take 2 capsules (40 mg total) by mouth daily. Patient taking differently: Take 40 mg by mouth daily as needed (acid reflux).  08/20/12   Dammen, Theron AristaPeter, PA-C  predniSONE (DELTASONE) 10 MG tablet Take 4 tablets (40 mg total) by mouth daily with breakfast for 3 days, THEN 3 tablets (30 mg total) daily with breakfast for 7 days. 01/01/18 01/11/18  Beola CordMelvin, Alexander, MD    ALLERGIES:  No Known Allergies  SOCIAL HISTORY:  Social History   Tobacco Use  . Smoking status: Current Every Day Smoker    Packs/day: 0.25  . Smokeless tobacco: Never Used  Substance Use Topics  . Alcohol use: Yes    Comment: 1 per week    FAMILY HISTORY: No family history on file.  EXAM: BP (!) 132/94 (BP Location: Right Arm)   Pulse 78   Temp 98.9 F (37.2 C) (Oral)   Resp (!) 22   Ht 5\' 1"  (1.549 m)   Wt 69.9 kg (154 lb)   LMP 12/20/2017   SpO2 100%   BMI 29.10 kg/m  CONSTITUTIONAL: Alert and oriented and responds appropriately to questions.  Afebrile and nontoxic-appearing.  She is moaning and has consistent dry cough. HEAD: Normocephalic EYES: Conjunctivae clear, pupils appear equal, EOMI ENT: normal nose; moist mucous membranes NECK: Supple, no meningismus, no nuchal rigidity, no LAD  CARD: RRR; S1 and S2 appreciated; no murmurs, no clicks, no rubs, no gallops RESP: Normal chest excursion without splinting or tachypnea; breath sounds clear and equal bilaterally; no wheezes, no rhonchi, no rales, no hypoxia or respiratory distress, speaking full sentences ABD/GI: Normal bowel sounds;  non-distended; soft, tender to palpation throughout the entire abdomen, no rebound, no guarding, no peritoneal signs, no hepatosplenomegaly BACK:  The back appears normal and is non-tender to palpation, there is no CVA tenderness EXT: Normal ROM in all joints; non-tender to palpation; no edema; normal capillary refill; no cyanosis, no calf tenderness or swelling, patient is tender to palpation wherever I touch her body.  There is no joint effusion, redness or warmth noted to her extremities.  2+ radial and DP pulses bilaterally.  Compartments are all soft, warm and well perfused.    SKIN: Normal color for age and race; warm; no rash NEURO: Moves all extremities equally, normal sensation diffusely PSYCH: The patient's mood and manner are appropriate. Grooming and personal hygiene are appropriate.  MEDICAL DECISION MAKING: Patient just admitted to the hospital for pericarditis in the setting of lupus.  Started on colchicine and prednisone taper.  Is taking hydrocodone and ibuprofen for pain.  Comes in today with multiple complaints.  She is afebrile here and nontoxic.  Labs show slight leukocytosis which may be from prednisone use.  She is diffusely tender throughout the abdomen.  Will obtain CT scan for further evaluation.  Differential includes viral gastroenteritis, colitis, diverticulitis, bowel obstruction, appendicitis, cholecystitis.  She reports a dry cough since her admission on 29 March.  Her lungs are currently clear.  She does not appear volume overloaded on exam and I do not appreciate any edema.  No signs of gout, septic arthritis, cellulitis, compartment syndrome.  No injury to her extremities.  She has complaints of pain all over including her chest.  She is unable to describe this pain.  She did have an echocardiogram on the 29th that showed a small pericardial effusion and an EF of 60-65%.  Otherwise echo was unremarkable.  I suspect that a lot of her symptoms are related to her lupus and may  be also related to her new medications including prednisone.  Labs obtained in triage are unremarkable other than the mild leukocytosis.  Urine shows no sign of infection.  Troponin is negative.  Doubt PE, dissection, ACS.  She is not pregnant.  Will obtain chest x-ray and CT of the abdomen and pelvis.  Will treat with Dilaudid and Zofran for symptomatic relief that we have discussed that if we do not find any life-threatening illness today that she will need to follow-up with her rheumatologist as scheduled tomorrow.  She is comfortable with this plan.  Her EKG is reassuring with no new ischemic abnormality, arrhythmia, interval change.  She is hemodynamically stable here.  ED PROGRESS: Chest x-ray shows mild cardiac enlargement and no focal consolidation.  There is a small left pleural effusion.  CT of her abdomen pelvis shows again trace left effusion in the left lung with left lower lobe atelectasis but no consolidation.  She has resolution of her pericardial effusion.  Under distention of the bladder noted.  She has no symptoms of cystitis  and her urine shows no sign of infection.  She does appear constipated without bowel obstruction.  Have recommended bowel regimen given she is on hydrocodone at home.  She appears much more comfortable after Dilaudid.  Again I suspect these may be symptoms of her lupus or related to medications.  She has follow-up with her rheumatologist tomorrow.  She has Vicodin for pain control at home.  I feel she is safe for discharge.  At this time, I do not feel there is any life-threatening condition present. I have reviewed and discussed all results (EKG, imaging, lab, urine as appropriate) and exam findings with patient/family. I have reviewed nursing notes and appropriate previous records.  I feel the patient is safe to be discharged home without further emergent workup and can continue workup as an outpatient as needed. Discussed usual and customary return precautions.  Patient/family verbalize understanding and are comfortable with this plan.  Outpatient follow-up has been provided if needed. All questions have been answered.      EKG Interpretation  Date/Time:  Saturday January 04 2018 21:55:46 EDT Ventricular Rate:  80 PR Interval:  154 QRS Duration: 82 QT Interval:  342 QTC Calculation: 394 R Axis:   118 Text Interpretation:  Normal sinus rhythm Right axis deviation ST & T wave abnormality, consider anterolateral ischemia Abnormal ECG No significant change since last tracing Confirmed by Rochele Raring [phone]) on 01/04/2018 11:22:07 PM         Modest Draeger, Layla Maw, DO 01/05/18 [id number]

## 2018-01-05 NOTE — ED Notes (Signed)
Attempted use of pure wick; pt unable to use

## 2018-01-05 NOTE — ED Notes (Signed)
Patient transported to X-ray 

## 2018-01-05 NOTE — Discharge Instructions (Signed)
You can take 2 Vicodin tablets every 6 hours as needed for pain.  Please note that narcotic pain medication may make you more constipated.  I recommend that you increase your water and fiber intake. If you are not able to eat foods high in fiber, you may use Benefiber or Metamucil over-the-counter. I also recommend you use MiraLAX 1-2 times a day and Colace 100 mg twice a day to help with bowel movements. These medications are over the counter.  You may use other over-the-counter medications such as Dulcolax, Fleet enemas, magnesium citrate as needed for constipation. Please note that some of these medications may cause you to have abdominal cramping which is normal. If you develop severe abdominal pain, fever, vomiting, distention of your abdomen, unable to have a bowel movement for 5 days or are not passing gas, please return to the hospital.   Your workup today was reassuring.  Please follow-up with your rheumatologist.

## 2018-01-06 ENCOUNTER — Ambulatory Visit: Payer: Medicaid Other

## 2018-01-06 ENCOUNTER — Ambulatory Visit (INDEPENDENT_AMBULATORY_CARE_PROVIDER_SITE_OTHER): Payer: Self-pay | Admitting: Internal Medicine

## 2018-01-06 ENCOUNTER — Ambulatory Visit: Payer: Medicaid Other | Admitting: Family Medicine

## 2018-01-06 DIAGNOSIS — Z79891 Long term (current) use of opiate analgesic: Secondary | ICD-10-CM

## 2018-01-06 DIAGNOSIS — Z791 Long term (current) use of non-steroidal anti-inflammatories (NSAID): Secondary | ICD-10-CM

## 2018-01-06 DIAGNOSIS — B3323 Viral pericarditis: Secondary | ICD-10-CM | POA: Insufficient documentation

## 2018-01-06 DIAGNOSIS — I301 Infective pericarditis: Secondary | ICD-10-CM

## 2018-01-06 DIAGNOSIS — Z7952 Long term (current) use of systemic steroids: Secondary | ICD-10-CM

## 2018-01-06 DIAGNOSIS — Z79899 Other long term (current) drug therapy: Secondary | ICD-10-CM

## 2018-01-06 DIAGNOSIS — M329 Systemic lupus erythematosus, unspecified: Secondary | ICD-10-CM

## 2018-01-06 MED ORDER — HYDROCODONE-ACETAMINOPHEN 5-325 MG PO TABS
2.0000 | ORAL_TABLET | ORAL | 0 refills | Status: AC | PRN
Start: 2018-01-06 — End: 2018-01-13

## 2018-01-06 MED ORDER — CYCLOBENZAPRINE HCL 5 MG PO TABS: 5.0000 mg | ORAL_TABLET | Freq: Three times a day (TID) | ORAL | 1 refills | Status: DC | PRN

## 2018-01-06 MED FILL — CYCLOBENZAPRINE 5 MG TABLET: 5 | 20 days supply | Qty: 60 | Fill #0

## 2018-01-06 MED FILL — HYDROCODON-APAP 5-325: 5-325 | 7 days supply | Qty: 60 | Fill #0

## 2018-01-06 NOTE — Progress Notes (Signed)
   CC: HFU, establish care, SLE pain  HPI:  [name] is a 37 y.o. 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 For details regarding today's visit and the status of their chronic medical issues, please refer to the assessment and plan.  Past Medical History:  Diagnosis Date  . Bronchitis    Review of Systems:   General: +Chills, fatigue. Denies fevers, weight loss.  HEENT: Denies acute changes in vision, sore throat, dysphagia Cardiac: +Improved pleuritic CP. No chest pressure. Pulmonary: Denies cough, wheezing Abd: Denies abdominal pain, changes in bowels Extremities: +BL weakness. +swelling. +polyarticular pain and swelling.  Physical Exam: General: Alert, in mild distress. Husband present, both frustrated. HEENT: No icterus, injection or ptosis. No hoarseness or dysarthria  Cardiac: RRR, no MGR appreciated Pulmonary: CTA BL with normal WOB on RA. Able to speak in complete sentences Abd: Soft, not distended. +bs  Extremities: Warm, perfused. No significant pedal edema. Complains of diffuse joint pains, mostly r wrist, BL knees and feet. R wrist with mild edema and TTP.    Assessment & Plan:   See Encounters Tab for problem based charting.  Patient discussed with Dr. Cleda DaubE. Hoffman

## 2018-01-06 NOTE — Assessment & Plan Note (Signed)
Assessment & Plan: She has had near complete resolution of her pleuritic chest pain. CT obtained yesterday showed improvement of pericardial effusion. Will continue current management of steroid taper & NSAIDs.

## 2018-01-06 NOTE — Patient Instructions (Addendum)
It was good seeing you today. I'm sorry you are continuing to experience pain and random swelling. This is most likely related to your Lupus diagnosis. We have you on the appropriate medical therapy that we can provide here in the Internal Medicine Office. Rheumatologists have more medications but unfortunately you need insurance to see them. Please continue working on getting the [employer]. The sooner you have this, the sooner we can refer you to the specialist. For now, please continue:  -Prednisone  -Colchicine -Plaquenil -Ibuprofen -Omeprazole and Famotidine   Please see the information below about Lupus and its treatments:  Systemic Lupus Erythematosus, Adult Systemic lupus erythematosus is a long-term (chronic) disease that can affect many parts of the body. It can damage the skin, joints, blood vessels, brain, kidneys, lungs, heart, and other internal organs. It causes pain, irritation, and inflammation. Systemic lupus erythematosus is an autoimmune disease. With this type of disease, the body's defense system (immune system) mistakenly attacks normal tissues instead of attacking germs or abnormal growths. What are the causes? The cause of this condition is not known. What increases the risk? This condition is more likely to develop in:  Females.  People of Asian descent.  People of African-American descent.  People who have a family history of the condition.  What are the signs or symptoms? General symptoms include:  Joint pain and swelling (common).  Fever.  Fatigue.  Unusual weight loss or weight gain.  Skin rashes, especially over the nose and cheeks (butterfly rash) and after sun exposure.  Sores inside the mouth or nose.  Other symptoms depend on which parts of the body are affected. They can include:  Shortness of breath.  Chest pain.  Frequent urination.  Blood in the urine.  Seizures.  Mental changes.  Hair loss.  Swollen and tender lymph  nodes.  Swelling of the hands or feet.  Symptoms can come and go. A period of time when symptoms get worse or come back is called a flare. A period of time with no symptoms is called a remission. How is this diagnosed? This condition is diagnosed based on symptoms, a medical history, and a physical exam. You may also have tests, including:  Blood tests.  Urine tests.  A chest X-ray.  A skin or kidney biopsy. For this test, a sample of tissue is taken from the skin or kidney and studied under a microscope.  You may be referred to an autoimmune disease specialist (rheumatologist). How is this treated? There is no cure for this condition, but treatment can keep the disease in remission, help to control symptoms, and prevent damage to the heart, lungs, kidneys, and other organs. Treatment may involve taking a combination of medicines over time including: 1. NSAIDs: These are medications like Ibuprofen, Colchicine, Naproxen - These are the mainstay of treatment as lupus is an inflammatory disorder and these medicatinos work by reducing inflammation.  2. Steroids: Like Prednisone. 3. Plaquenel 4. Opioids have been proven to have greater risk than benefit in patients with Lupus.  Follow these instructions at home: Medicines  Take medicines only as directed by your health care provider.  Do not take any medicines that contain estrogen without first checking with your health care provider. Estrogen can trigger flares and may increase your risk for blood clots. Lifestyle  Eat a heart-healthy diet.  Stay active as directed by your health care provider.  Do not smoke. If you need help quitting, ask your health care provider.  Protect your  skin from the sun by applying sunblock and wearing protective hats and clothing.  Learn as much as you can about your condition and have a good support system in place. Support may come from family, friends, or a lupus support group. General  instructions  Keep all follow-up visits as directed by your health care provider. This is important.  Work closely with all of your health care providers to manage your condition.  Let your health care provider know right away if you become pregnant or if you plan to become pregnant. Pregnancy in women with this condition is considered high risk. Contact a health care provider if:  You have a fever.  Your symptoms flare.  You develop new symptoms.  You develop swollen feet or hands.  You develop puffiness around your eyes.  Your medicines are not working.  You have bloody, foamy, or coffee-colored urine.  There are changes in your urination. For example, you urinate more often at night.  You think that you may be depressed or have anxiety. Get help right away if:  You have chest pain.  You have trouble breathing.  You have a seizure.  You suddenly get a very bad headache.  You suddenly develop facial or body weakness.  You cannot speak.  You cannot understand speech. This information is not intended to replace advice given to you by your health care provider. Make sure you discuss any questions you have with your health care provider. Document Released: 09/07/2002 Document Revised: 05/13/2016 Document Reviewed: 08/25/2014 Elsevier Interactive Patient Education  [employer]2018 [employer]

## 2018-01-06 NOTE — Assessment & Plan Note (Addendum)
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 She was started on Plaquenil and remains on colchicine, prednisone and ibuprofen. On hospital dc 4/2, she was prescribed hydrocodone/APAP 5-325mg  #30 with no refills. She has been trying to use this sparingly and has 7-8 pills left; this averages to about 3 pills per day. Stated that 1 helps for about 2 hours, but she took 2 and got relief for about 4 hours.   She has been unable to sleep or perform many activities due to pain and actually has been unable to go back to full-duty at work. She also describes muscle cramping, intermittent joint swelling and fatigue. She is very frustrated over her uncontrolled pain, concerns about her future employment given her inability to work right now, and inability to see a specialist today to get advanced treatments.  Plan: Patient encouraged to continue working on obtaining the [employer] Card so she can be seen by Rheumatology. We discussed standard treatment of Lupus including anti-inflammatory medications, DMARDs and lifestyle modifications. We also discussed that while she does get several hours of relief, standard treatment of SLE does not include narcotic pain medications and that these will not be a prolonged component of her treatment regimen as focus should be on controlling inflammation vs masking pain.  Will continue current course of Plaquenil, Ibuprofen, Colchicine and Prednisone. Unfortunately, Plaquenil takes anywhere from 1-3 months before effects are noticed and she is not getting significant improvement from the above therapy. I did not start Methotrexate today, but I will discuss implementing this with rheumatology tomorrow afternoon to get her off narcotics sooner. For now, will provide 1-week supply of Norco with strict understanding that this is not a long-term medication.  -Patient to continue working on orange card -Continue Colchicine,  Ibuprofen, Prednisone and Plaquenil  -Will discuss Methotrexate with rheumatology tomorrow -1 week supply of Norco given, with strict understanding that this medication will not be continued indefinitely, but unfortunately it does take several months for Plaquenil to have much effect

## 2018-01-09 NOTE — Progress Notes (Signed)
Internal Medicine Clinic Attending  Case discussed with Dr. Molt at the time of the visit.  We reviewed the resident's history and exam and pertinent patient test results.  I agree with the assessment, diagnosis, and plan of care documented in the resident's note. 

## 2018-01-11 ENCOUNTER — Telehealth: Payer: Self-pay | Admitting: Internal Medicine

## 2018-01-11 MED ORDER — PREDNISONE 10 MG PO TABS: ORAL_TABLET | ORAL | 0 refills | Status: DC

## 2018-01-11 NOTE — Telephone Encounter (Signed)
   Reason for call:   I received a call from [name] at 11:00 AM indicating that she has ran out of prednisone prior to the completion of her taper.   Pertinent Data:   Patient was recently discharged from the inpatient service for pericarditis likely as a complication of her newly diagnosed lupus   She was discharged with a prednisone taper and has completed the first two weeks of this ( 40 mg x 1 weeks followed by 30 mg x 1 weeks). She ran out of tablets today but knows that she has an additional two weeks of the taper left.     Assessment / Plan / Recommendations:   Sent in a prescription for prednisone 10 mg x 21 tablets for her to continue the taper ( 20 mg daily x 1 week and 10 mg daily x 1 week ). She has asked that this go to [facility] as the [facility] pharmacy is closed on the weekend.   As always, pt is advised that if symptoms worsen or new symptoms arise, they should go to an urgent care facility or to to ER for further evaluation.   Eulah PontBlum, Rufino Staup, MD   01/11/2018, 11:01 AM

## 2018-01-13 ENCOUNTER — Telehealth: Payer: Self-pay | Admitting: *Deleted

## 2018-01-13 NOTE — Telephone Encounter (Signed)
Front office received e-mail stating "Going back to work with work restrictions. I am still having pian on movement". Called pt to  f/u on this e-mail - no answer; left message to call the the office.

## 2018-01-13 NOTE — Telephone Encounter (Signed)
[facility]

## 2018-01-16 ENCOUNTER — Encounter: Payer: Self-pay | Admitting: Internal Medicine

## 2018-01-16 ENCOUNTER — Other Ambulatory Visit: Payer: Self-pay

## 2018-01-16 ENCOUNTER — Ambulatory Visit: Payer: Self-pay

## 2018-01-16 ENCOUNTER — Ambulatory Visit (INDEPENDENT_AMBULATORY_CARE_PROVIDER_SITE_OTHER): Payer: Self-pay | Admitting: Internal Medicine

## 2018-01-16 VITALS — BP 138/78 | HR 100 | Temp 98.4°F | Ht 61.0 in | Wt 157.5 lb

## 2018-01-16 DIAGNOSIS — Z7952 Long term (current) use of systemic steroids: Secondary | ICD-10-CM

## 2018-01-16 DIAGNOSIS — Z79899 Other long term (current) drug therapy: Secondary | ICD-10-CM

## 2018-01-16 DIAGNOSIS — M328 Other forms of systemic lupus erythematosus: Secondary | ICD-10-CM

## 2018-01-16 DIAGNOSIS — M3212 Pericarditis in systemic lupus erythematosus: Secondary | ICD-10-CM

## 2018-01-16 MED ORDER — PREDNISONE 20 MG PO TABS: 40.0000 mg | ORAL_TABLET | Freq: Every day | ORAL | 0 refills | Status: DC

## 2018-01-16 MED FILL — predniSONE 20 MG TABS: 20 | 30 days supply | Qty: 60 | Fill #0

## 2018-01-16 NOTE — Progress Notes (Signed)
   CC: follow-up of lupus  HPI:  Diana Huynh is a 37 y.o. F with recent diagnosis of SLE presenting as pericarditis who presents today for continued follow-up. She does not have insurance is currently prescribed Plaquenil, colchicine and prednisone-taper.    For details regarding today's visit and the status of their chronic medical issues, please refer to the assessment and plan.  Past Medical History:  Diagnosis Date  . Bronchitis   . Pleuritis 12/31/2017   Review of Systems:   General: +Fatigue, malaise, myalgias, arthralgias. Denies fevers, chills HEENT: Denies acute changes in vision, sore throat, dysphagia Cardiac: +Chest pain, DOE. Denies palpitations Pulmonary: +Occasional cough. Denies orthopnea or PND Abd: Denies abdominal pain, changes in bowels Extremities: +Weakness, joint swelling.  Physical Exam: General: Alert, pleasant and conversant. Looks fatigued and uncomfortable. HEENT: No icterus, injection or ptosis. No hoarseness or dysarthria. No JVD. Cardiac: Tachycardic but otherwise regular. Pulmonary: CTA BL with normal WOB on RA. Able to speak in complete sentences Abd: Soft, non-tender. +bs Extremities: Warm, perfused. Swelling right wrist, diffuse joint pain throughout. Neuro: Strength intact throughout, though patient reports subjective weakness. Facial muscles symmetric. EOMI. Tongue protrudes midline. Skin: No rashes or bruising. Psych: Low mood and anxious. Discouraged about SLE and ongoing issues.  Vitals:   01/16/18 1428  BP: 138/78  Pulse: 100  Temp: 98.4 F (36.9 C)  TempSrc: Oral  SpO2: 100%  Weight: 157 lb 8 oz (71.4 kg)  Height: 5\' 1"  (1.549 m)   Body mass index is 29.76 kg/m.  Assessment & Plan:   See Encounters Tab for problem based charting.  Patient discussed with Dr. Oswaldo DoneVincent

## 2018-01-16 NOTE — Assessment & Plan Note (Signed)
Assessment: Diana Huynh presents for continued evaluation and management of SLE. She is on Plaquenil 200mg  daily, Colcicine 0.6mg  BID and is currently on Prednisone 20mg  via taper. She has been tapering her prednisone appropriately, and decreased from 40mg  to 20mg  on Sunday. At our last visit 2-weeks ago, she complained of diffuse pain, sporadic muscle cramps, intermittent joint swelling and fatigue which have worsened over the past 4-5 days. She endorses mild chest discomfort and occasional dry cough, but denied any fevers, chills, chest pressure, runny nose, sore throat, abdominal pain, diarrhea, nausea or vomiting.   Plan: I suspect recent decrease in prednisone is related to her current presentation. Will increase back to 40mg  daily with close follow-up here in 2 weeks. She is working on obtaining the [employer], and apparently has only a few more documents to submit. Given her persistent and uncontrolled symptoms, I advised her to reach out to the Rheumat[facility] clinics [facility] as some will see uninsured patients to hopefully get a sooner appointment.  -Provided with contact info for [facility], advised patient to call ASAP and inquire about appointments for uninsured patients -Increase Prednisone to 40mg  daily -Continue Colchicine and Plaquenil  -RTC 2 weeks, or sooner if needed -Provided work note until 5/3

## 2018-01-16 NOTE — Patient Instructions (Signed)
I'm sorry you are not feeling well today.   I suspect we need stop tapering your Prednisone and increase you back go 40mg  daily. I have sent a new prescription to your pharmacy.   Please continue working on the orange card, but I think we need to try to get you seen outside of the [facility] system if it can expedite the process. The [facility] will sometimes see patients without insurance. Please call the clinics below and try to schedule an appointment!!  [facility]:  [phone]  [facility] [address] [facility] [address] [facility] [address] [place], [place] [address]  [email or web address]  [facility]: Phone: [phone] [facility] [address], [address] [place], [place] [address] [email or web address]  [facility]: Phone: [phone] [facility] [address] [place], [place] [address] [email or web address]

## 2018-01-20 NOTE — Addendum Note (Signed)
Addended by: Erlinda HongVINCENT, Jenniefer Salak T on: 01/20/2018 03:57 PM   Modules accepted: Level of Service

## 2018-01-20 NOTE — Progress Notes (Signed)
Internal Medicine Clinic Attending  Case discussed with Dr. Molt at the time of the visit.  We reviewed the resident's history and exam and pertinent patient test results.  I agree with the assessment, diagnosis, and plan of care documented in the resident's note. 

## 2018-01-29 ENCOUNTER — Encounter: Payer: Self-pay | Admitting: Physician Assistant

## 2018-01-29 ENCOUNTER — Ambulatory Visit (INDEPENDENT_AMBULATORY_CARE_PROVIDER_SITE_OTHER): Payer: Self-pay | Admitting: Physician Assistant

## 2018-01-29 VITALS — BP 122/78 | HR 96 | Ht 61.0 in | Wt 163.1 lb

## 2018-01-29 DIAGNOSIS — M3212 Pericarditis in systemic lupus erythematosus: Secondary | ICD-10-CM

## 2018-01-29 DIAGNOSIS — I3139 Other pericardial effusion (noninflammatory): Secondary | ICD-10-CM

## 2018-01-29 DIAGNOSIS — I319 Disease of pericardium, unspecified: Secondary | ICD-10-CM

## 2018-01-29 DIAGNOSIS — I313 Pericardial effusion (noninflammatory): Secondary | ICD-10-CM

## 2018-01-29 MED ORDER — FAMOTIDINE 20 MG PO TABS: 20.0000 mg | ORAL_TABLET | Freq: Two times a day (BID) | ORAL | 3 refills | Status: DC

## 2018-01-29 NOTE — Progress Notes (Signed)
Cardiology Office Note:    Date:  01/29/2018   ID:  Diana Huynh, DOB March 03, 1981, MRN [id number]  PCP:  System, Pcp Not In  Cardiologist:  Kristeen Miss, MD   Referring MD: No ref. provider found   Chief Complaint  Patient presents with  . Hospitalization Follow-up    pericarditis    History of Present Illness:    Diana Huynh is a 37 y.o. female tobacco smoker who was admitted 3/29-4/2 with acute pericarditis.  ANA was positive and serology work-up was consistent with systemic lupus erythematosus.  This was felt to be the cause of her pericarditis.  Echocardiogram demonstrated small pericardial effusion without evidence of tamponade.  She was placed on colchicine, prednisone taper and ibuprofen.  Internal medicine also placed her on hydroxychloroquine and referred her to rheumatology.  She has been followed in the internal medicine clinic at [facility] since discharge.  With reduced dose of prednisone, she developed worsening chest discomfort.  Her prednisone dose was increased.  Referral to rheumatology is still pending.  [name] returns for follow up.  She is here with her husband and daughter.  She is trying to get in to see Rheumatology with [facility] in [facility].  She feels better since increasing her prednisone dose. She is only taking ibuprofen as needed.  She does have a non-productive cough with lying flat.  She does note some dyspnea at times.  She feels fatigued and notes arthralgias.  She feels lightheaded at times.  She denies syncope.  She has not had any further chest pain.    Prior CV studies:   The following studies were reviewed today:  Abdominal CT 01/05/2018 IMPRESSION: 1. Trace left effusion with adjacent left lower lobe atelectasis. Mild cardiac enlargement. Resolution of pericardial effusion. 2. Congenital malrotation of the right kidney. Probable under distention of the bladder counting for circumferential thickening of the bladder wall. Correlate to exclude  cystitis. 3. Moderate colonic stool burden without bowel obstruction or Inflammation.  Echo 12/27/2017 EF 60-65, normal wall motion, trivial MR, small pericardial effusion  Chest CTA 12/27/2017 IMPRESSION: 1. Small to moderate pericardial effusion. Although limited by CTA timing, suspect there is associated serosal thickening and underlying pericarditis. 2. Trace left pleural effusion, possibly from serositis. 3. Atelectasis. 4. Negative for pulmonary embolism.  Past Medical History:  Diagnosis Date  . Bronchitis   . Pleuritis 12/31/2017   Surgical Hx: The patient  has a past surgical history that includes Appendectomy; Ankle surgery; and Cesarean section.   Current Medications: Current Meds  Medication Sig  . alum & mag hydroxide-simeth (MAALOX REGULAR STRENGTH) 200-200-20 MG/5ML suspension Take 30 mLs by mouth every 6 (six) hours as needed for indigestion or heartburn.  . colchicine 0.6 MG tablet Take 1 tablet (0.6 mg total) by mouth 2 (two) times daily.  . cyclobenzaprine (FLEXERIL) 5 MG tablet Take 1 tablet (5 mg total) by mouth 3 (three) times daily as needed for muscle spasms.  . hydroxychloroquine (PLAQUENIL) 200 MG tablet Take 1 tablet (200 mg total) by mouth daily.  Marland Kitchen ibuprofen (ADVIL,MOTRIN) 600 MG tablet Take 1 tablet (600 mg total) by mouth 3 (three) times daily.  . ondansetron (ZOFRAN ODT) 4 MG disintegrating tablet Take 1 tablet (4 mg total) by mouth every 6 (six) hours as needed.  . predniSONE (DELTASONE) 20 MG tablet Take 2 tablets (40 mg total) by mouth daily with breakfast.  . [DISCONTINUED] famotidine (PEPCID) 20 MG tablet Take 1 tablet (20 mg total) by mouth 2 (  two) times daily.  . [DISCONTINUED] famotidine (PEPCID) 20 MG tablet Take 20 mg by mouth as needed for heartburn or indigestion.  . [DISCONTINUED] omeprazole (PRILOSEC) 20 MG capsule Take 2 capsules (40 mg total) by mouth daily.     Allergies:   Patient has no known allergies.   Social History    Tobacco Use  . Smoking status: Former Smoker    Packs/day: 0.25    Types: Cigarettes  . Smokeless tobacco: Never Used  . Tobacco comment: quit 12/27/2017   Substance Use Topics  . Alcohol use: Yes    Comment: 1 per week  . Drug use: No     Family Hx: The patient's family history is not on file.  ROS:   Please see the history of present illness.    ROS All other systems reviewed and are negative.   EKGs/Labs/Other Test Reviewed:    EKG:  EKG is  ordered today.  The ekg ordered today demonstrates normal sinus rhythm, heart rate 96, normal axis, nonspecific ST-T wave changes, RAE, PR 202 ms, QTC 411 ms  Recent Labs: 12/26/2017: B Natriuretic Peptide 28.6 12/27/2017: TSH 1.582 01/04/2018: ALT 41; BUN 13; Creatinine, Ser 0.72; Hemoglobin 10.3; Platelets 590; Potassium 3.5; Sodium 138   Recent Lipid Panel Lab Results  Component Value Date/Time   CHOL 119 12/27/2017 05:06 PM   TRIG 41 12/27/2017 05:06 PM   HDL 31 (L) 12/27/2017 05:06 PM   CHOLHDL 3.8 12/27/2017 05:06 PM   LDLCALC 80 12/27/2017 05:06 PM    Physical Exam:    VS:  BP 122/78   Pulse 96   Ht  (1.549 m)   Wt 163 lb 1.9 oz (74 kg)   SpO2 99%   BMI 30.82 kg/m     Wt Readings from Last 3 Encounters:  01/29/18 163 lb 1.9 oz (74 kg)  01/16/18 157 lb 8 oz (71.4 kg)  01/04/18 154 lb (69.9 kg)     Physical Exam  Constitutional: She is oriented to person, place, and time. She appears well-developed and well-nourished. No distress.  HENT:  Head: Normocephalic and atraumatic.  Neck: Neck supple. No JVD present.  Cardiovascular: Normal rate, regular rhythm, S1 normal, S2 normal and normal heart sounds. Exam reveals no friction rub.  No murmur heard. Pulmonary/Chest: Effort normal. She has no wheezes. She has no rales.  Abdominal: Soft. There is no hepatomegaly.  Musculoskeletal: She exhibits no edema.  Neurological: She is alert and oriented to person, place, and time.  Skin: Skin is warm and dry.     ASSESSMENT & PLAN:    Information obtained from UpToDate: Course and treatment - Pericarditis resolves in the large majority of SLE patients [13]. It is often a manifestation of an SLE flare and thus hydroxychloroquine, with a short course of a nonsteroidal antiinflammatory drug (NSAID) or a medium- to low-dose glucocorticoid taper (20 mg/day down over two to four weeks) are first-line therapies [16]. (See "Acute pericarditis: Treatment and prognosis".)  Colchicine may reduce the risk of recurrence of idiopathic pericarditis [17], but the effectiveness of this agent in patients with SLE is uncertain. Use of colchicine may be warranted in patients with SLE who do not respond rapidly to NSAIDs and/or glucocorticoids. As colchicine can suppress the bone marrow, the complete blood count should be monitored closely in SLE patients taking colchicine (see "Acute pericarditis: Treatment and prognosis", section on 'Colchicine'). Patients with recurrent and severe pericarditis, often in the setting of active SLE, may require immunosuppression with  a drug such as azathioprine or mycophenolate mofetil [16]. (See "Recurrent pericarditis", section on 'Other immune therapy'.)  Percutaneous drainage with echocardiographic guidance is effective in treating cardiac tamponade; surgical drainage and pericardiectomy are rarely needed but may be necessary if there is a loculated effusion. (See "Cardiac tamponade".)   #1.  Pericarditis associated with systemic lupus erythematosus, unspecified chronicity (HCC) Symptomatically improved on prednisone and colchicine.  She is also on Hydroxychloroquine per primary care.  She is trying to get in to see Rheumatology in [place] with [facility].  We typically keep patients on Colchicine for 3 mos to reduce recurrence of pericarditis.  If Rheumatology wants her to remain on colchicine longer, I would defer to their expertise.  Her prednisone will be managed by primary care and Rheumatology.   She has a non-productive cough when she lies flat.  Question if this is related to acid reflux.    -Continue colchicine for at least 3 months (longer depending on recommendations from rheumatology)  -Prednisone, hydroxychloroquine management per primary care/rheumatology  -Plan 35-month follow-up (probable PRN follow up afterward)  -Discontinue colchicine at next visit, unless she requires longer therapy  -Obtain follow-up CBC  -Increase Pepcid to bid for GI protection  #2.  Pericardial effusion   Resolved on most recent CT scan.    Dispo:  Return in about 2 months (around 03/31/2018) for Routine Follow Up, w/ Tereso Newcomer, PA-C.   Medication Adjustments/Labs and Tests Ordered: Current medicines are reviewed at length with the patient today.  Concerns regarding medicines are outlined above.  Tests Ordered: Orders Placed This Encounter  Procedures  . EKG 12-Lead   Medication Changes: Meds ordered this encounter  Medications  . famotidine (PEPCID) 20 MG tablet    Sig: Take 1 tablet (20 mg total) by mouth 2 (two) times daily.    Dispense:  180 tablet    Refill:  3    Signed, Tereso Newcomer, PA-C  01/29/2018 11:05 AM    [facility] [address] [place], [place], [place]  [address] Phone: [phone]; Fax: [phone]

## 2018-01-29 NOTE — Patient Instructions (Addendum)
Medication Instructions:  1. START TAKING PEPCID 20 MG TWICE DAILY EVERY DAY; RX HAS BEEN SENT IN TO [facility]  Labwork: YOU HAVE BEEN GIVEN AN RX TO HAVE LAB WORK (CBC) DONE TOMORROW WITH PCP ; PLEASE HAVE LAB RESULTS FAXED TO [place], [facility] [phone]  Testing/Procedures: NONE ORDERED TODAY  Follow-Up: April 01, 2018 @ 9:45 WITH [place], [facility]   Any Other Special Instructions Will Be Listed Below (If Applicable).  HAVE RHEUMATOLOGIST FAX NOTES TO [place], [facility] [phone]    If you need a refill on your cardiac medications before your next appointment, please call your pharmacy.

## 2018-01-30 ENCOUNTER — Other Ambulatory Visit: Payer: Self-pay

## 2018-01-30 ENCOUNTER — Encounter: Payer: Self-pay | Admitting: Internal Medicine

## 2018-01-30 ENCOUNTER — Ambulatory Visit (INDEPENDENT_AMBULATORY_CARE_PROVIDER_SITE_OTHER): Payer: Self-pay | Admitting: Internal Medicine

## 2018-01-30 VITALS — BP 113/66 | HR 106 | Temp 98.9°F | Wt 164.0 lb

## 2018-01-30 DIAGNOSIS — Z79899 Other long term (current) drug therapy: Secondary | ICD-10-CM

## 2018-01-30 DIAGNOSIS — M328 Other forms of systemic lupus erythematosus: Secondary | ICD-10-CM

## 2018-01-30 DIAGNOSIS — M329 Systemic lupus erythematosus, unspecified: Secondary | ICD-10-CM

## 2018-01-30 DIAGNOSIS — Z87891 Personal history of nicotine dependence: Secondary | ICD-10-CM

## 2018-01-30 DIAGNOSIS — Z7952 Long term (current) use of systemic steroids: Secondary | ICD-10-CM

## 2018-01-30 DIAGNOSIS — L709 Acne, unspecified: Secondary | ICD-10-CM

## 2018-01-30 MED ORDER — CYCLOBENZAPRINE HCL 5 MG PO TABS: 5.0000 mg | ORAL_TABLET | Freq: Three times a day (TID) | ORAL | 1 refills | Status: DC | PRN

## 2018-01-30 MED ORDER — HYDROXYCHLOROQUINE SULFATE 200 MG PO TABS: 200.0000 mg | ORAL_TABLET | Freq: Every day | ORAL | 0 refills | Status: DC

## 2018-01-30 MED ORDER — PREDNISONE 10 MG PO TABS: ORAL_TABLET | ORAL | 0 refills | Status: DC

## 2018-01-30 MED ORDER — DICLOFENAC SODIUM 50 MG PO TBEC: DELAYED_RELEASE_TABLET | ORAL | 0 refills | Status: DC

## 2018-01-30 MED FILL — HYDROXYCHLOROQUINE SULFATE: 200 | 30 days supply | Qty: 30 | Fill #0

## 2018-01-30 MED FILL — DICLOFENAC SOD EC 50 MG TAB: 50 | 30 days supply | Qty: 90 | Fill #0

## 2018-01-30 NOTE — Progress Notes (Signed)
   CC: arthralgia, myalgias  HPI:  [name] is a 37 y.o. female with PMH below.  She is here to follow-up from her last visit where she was having symptoms from her lupus.    Please see A&P for status of the patient's chronic medical conditions  Past Medical History:  Diagnosis Date  . Bronchitis   . Pleuritis 12/31/2017   Review of Systems:  ROS: Pulmonary: pt denies increased work of breathing, shortness of breath,  Cardiac: pt denies palpitations, chest pain,  Abdominal: pt denies abdominal pain, nausea, vomiting, or diarrhea  Physical Exam:  Vitals:   01/30/18 0939  BP: 113/66  Pulse: (!) 106  Temp: 98.9 F (37.2 C)  TempSrc: Oral  SpO2: 100%  Weight: 164 lb (74.4 kg)   Physical Exam  Constitutional: No distress.  Cardiovascular: Normal rate, regular rhythm and normal heart sounds. Exam reveals no gallop and no friction rub.  No murmur heard. Pulmonary/Chest: Effort normal and breath sounds normal. No respiratory distress. She has no wheezes. She has no rales. She exhibits no tenderness.  Abdominal: Soft. Bowel sounds are normal. She exhibits no distension and no mass. There is no tenderness. There is no rebound and no guarding.  Musculoskeletal: She exhibits no edema.  Neurological: She is alert.  Skin: She is not diaphoretic.  Acne seen along the inferior border of bilateral scapulae and deltoids    Social History   Socioeconomic History  . Marital status: Married    Spouse name: Not on file  . Number of children: Not on file  . Years of education: Not on file  . Highest education level: Not on file  Occupational History  . Not on file  Social Needs  . Financial resource strain: Not on file  . Food insecurity:    Worry: Not on file    Inability: Not on file  . Transportation needs:    Medical: Not on file    Non-medical: Not on file  Tobacco Use  . Smoking status: Former Smoker    Packs/day: 0.25    Types: Cigarettes  . Smokeless tobacco:  Never Used  . Tobacco comment: quit 12/27/2017   Substance and Sexual Activity  . Alcohol use: Yes    Comment: 1 per week  . Drug use: No  . Sexual activity: Yes  Lifestyle  . Physical activity:    Days per week: Not on file    Minutes per session: Not on file  . Stress: Not on file  Relationships  . Social connections:    Talks on phone: Not on file    Gets together: Not on file    Attends religious service: Not on file    Active member of club or organization: Not on file    Attends meetings of clubs or organizations: Not on file    Relationship status: Not on file  . Intimate partner violence:    Fear of current or ex partner: Not on file    Emotionally abused: Not on file    Physically abused: Not on file    Forced sexual activity: Not on file  Other Topics Concern  . Not on file  Social History Narrative  . Not on file    History reviewed. No pertinent family history.  Assessment & Plan:   See Encounters Tab for problem based charting.  Patient discussed with Dr. Cyndie Chime

## 2018-01-30 NOTE — Patient Instructions (Addendum)
Diana Huynh good to see you.  We will attempt to taper your steroid dosage again.  Continue the plaquenil.  We will switch you to diclofenac for refractory joint pain to be used as needed.  Please take your pepcid daily from now on.  We have completed a referral to the rheumatologist you contacted and are faxing all your medical records to him.  We have stopped your colchicine as it is likely having little effect at this time.  Please take your pepcid daily as we prescribed to help protect your stomach.

## 2018-01-30 NOTE — Assessment & Plan Note (Addendum)
In talking with the patient today her symptoms have improved somewhat.  Her main symptoms are arthralgias and myalgias, it seems the pericarditis part of her pain has resolved.  She is using ibuprofen sparingly.  The patient prefers not to use ibuprofen she is wondering if there is another incident that we could try.  We spoke in detail about trying to taper her steroid again.  She understands that it is necessary for overall health to decrease the amount of steroid she is using.  Patient has continued taking the Plaquenil as prescribed.    -We will continue Plaquenil 200 mg daily. -Will DC colchicine as pericarditis has resolved and this is likely just causing GI toxicity at this point. -Ordered patient a flexible prednisone taper we will attempt to decrease her dosage by 10 mg every week.  She understands that this will be a difficult process and we will do what we can to make it a smooth transition with the understanding that if we must prolong the taper slightly is not a failure. -Faxed a copy of the patient's medical records to a rheumatologist willing to see the patient despite no insurance a [place] rheumatologist affiliated with [facility] Dr. Sharmon Revere, Leanord Hawking. -Instructed patient to take her Pepcid daily currently she is taking it as needed -will switch to PRN diclofenac  TID with meals for refractory joint pain as pt prefers not to be on ibuprofen

## 2018-01-30 NOTE — Progress Notes (Signed)
Medicine attending: Medical history, presenting problems, physical findings, and medications, reviewed with resident physician Dr Jesse Sans on the day of the patient visit and I concur with his evaluation and management plan. Young woman recently dxd w lupus when she presented with symptomatic pericarditis w assoc severe pain. Rx colchicine, steroids, & narcotic analgesics. Flare of pain every time steroids tapered. Still w sxs but improving. Plaquenil started before hospital D/C. Recent F/U w Cardiology. Colchicine stopped. Still requiring steroids.We will continue symptom management. Rheumatology referral for long term management.

## 2018-01-31 LAB — CBC
Hematocrit: 39.9 % (ref 34.0–46.6)
Hemoglobin: 12.5 g/dL (ref 11.1–15.9)
MCH: 28 pg (ref 26.6–33.0)
MCHC: 31.3 g/dL — AB (ref 31.5–35.7)
MCV: 90 fL (ref 79–97)
PLATELETS: 339 10*3/uL (ref 150–379)
RBC: 4.46 x10E6/uL (ref 3.77–5.28)
RDW: 15.9 % — AB (ref 12.3–15.4)
WBC: 14.7 10*3/uL — AB (ref 3.4–10.8)

## 2018-01-31 LAB — CMP14 + ANION GAP
ALK PHOS: 45 IU/L (ref 39–117)
ALT: 26 IU/L (ref 0–32)
ANION GAP: 14 mmol/L (ref 10.0–18.0)
AST: 16 IU/L (ref 0–40)
Albumin/Globulin Ratio: 1.6 (ref 1.2–2.2)
Albumin: 4.4 g/dL (ref 3.5–5.5)
BUN/Creatinine Ratio: 15 (ref 9–23)
BUN: 12 mg/dL (ref 6–20)
Bilirubin Total: <0.2 mg/dL (ref 0.0–1.2)
CHLORIDE: 102 mmol/L (ref 96–106)
CO2: 27 mmol/L (ref 20–29)
CREATININE: 0.79 mg/dL (ref 0.57–1.00)
Calcium: 9.6 mg/dL (ref 8.7–10.2)
GFR calc Af Amer: 111 mL/min/{1.73_m2} (ref >59)
GFR calc non Af Amer: 97 mL/min/{1.73_m2} (ref >59)
GLUCOSE: 96 mg/dL (ref 65–99)
Globulin, Total: 2.7 g/dL (ref 1.5–4.5)
Potassium: 4.2 mmol/L (ref 3.5–5.2)
Sodium: 143 mmol/L (ref 134–144)
Total Protein: 7.1 g/dL (ref 6.0–8.5)

## 2018-02-05 ENCOUNTER — Other Ambulatory Visit: Payer: Self-pay

## 2018-02-05 ENCOUNTER — Telehealth: Payer: Self-pay

## 2018-02-05 ENCOUNTER — Ambulatory Visit (INDEPENDENT_AMBULATORY_CARE_PROVIDER_SITE_OTHER): Payer: Self-pay | Admitting: Internal Medicine

## 2018-02-05 ENCOUNTER — Encounter: Payer: Self-pay | Admitting: Internal Medicine

## 2018-02-05 VITALS — BP 132/69 | HR 107 | Temp 98.7°F | Ht 61.0 in | Wt 165.7 lb

## 2018-02-05 DIAGNOSIS — G4700 Insomnia, unspecified: Secondary | ICD-10-CM

## 2018-02-05 DIAGNOSIS — Z79899 Other long term (current) drug therapy: Secondary | ICD-10-CM

## 2018-02-05 DIAGNOSIS — Z7952 Long term (current) use of systemic steroids: Secondary | ICD-10-CM

## 2018-02-05 DIAGNOSIS — M328 Other forms of systemic lupus erythematosus: Secondary | ICD-10-CM

## 2018-02-05 DIAGNOSIS — M329 Systemic lupus erythematosus, unspecified: Secondary | ICD-10-CM

## 2018-02-05 MED ORDER — DULOXETINE HCL 30 MG PO CPEP: 30.0000 mg | ORAL_CAPSULE | Freq: Every day | ORAL | 0 refills | Status: DC

## 2018-02-05 MED FILL — DULoxetine HCL 30 MG CPEP: 30 | 30 days supply | Qty: 30 | Fill #0

## 2018-02-05 NOTE — Telephone Encounter (Signed)
Ok thank you 

## 2018-02-05 NOTE — Progress Notes (Signed)
   CC: Pain all over  HPI:  [name] is a 37 y.o. female with PMHx detailed below presenting for follow up regarding her lupus and all over body pains. She was diagnosed earlier this year after presentation with pericarditis and since then has been on moderate to high steroid doses. She is currently taking prednisone , diclofenac  TID PRN, flexeril  PRN, and plaquenil  but continue to hurt all over. She has tried to exercise but quickly fatigues and muscles ache for hours after exertion. She does not feel like the PRN medications give much benefit at all. 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  See problem based assessment and plan below for additional details.  SLE (systemic lupus erythematosus) (HCC) She is having some central swelling and weight gain from the ongoing high dose steroids. Her pain all over today really spares the joints themselves and has not gotten better with steroids so her lupus does not seem to explain this well. I question if there is another process such as deconditioning, somatization, or something like fibromyalgia. I also agree with her proceeding to getting a rheumatology evaluation at [facility] for her lupus. Most often symptoms should respond to doses of  or less prednisone so I do not see any reason to go back up on this today. Plan: Start duloxetine  daily   Past Medical History:  Diagnosis Date  . Bronchitis   . Pleuritis 12/31/2017    Review of Systems: Review of Systems  Constitutional: Positive for malaise/fatigue. Negative for chills and fever.  Cardiovascular: Negative for chest pain and leg swelling.  Genitourinary: Negative for dysuria.  Musculoskeletal: Positive for back pain and myalgias. Negative for falls and joint pain.  Skin: Negative for rash.  Endo/Heme/Allergies: Does not bruise/bleed easily.  Psychiatric/Behavioral: The patient  has insomnia.      Physical Exam: Vitals:   02/05/18 1411  BP: 132/69  Pulse: (!) 107  Temp: 98.7 F (37.1 C)  TempSrc: Oral  SpO2: 100%  Weight: 165 lb 11.2 oz (75.2 kg)  Height:  (1.549 m)   GENERAL- alert, co-operative, NAD HEENT- Facial plethora, moist oral mucosa, no lyphadenopathy CARDIAC- Mild tachycardia, regular rhythm RESP- CTAB, no wheezes or crackles. NEURO- Reflexes are normal symmetrically EXTREMITIES- No pedal edema, no synovitis in hands or feet, muscle soreness to palpation over upper arms, shoulders, thighs, back SKIN- Warm, dry, No rash or lesion. PSYCH- Normal mood and affect, appropriate thought content and speech.   Assessment & Plan:   See encounters tab for problem based medical decision making.   Patient discussed with Dr. Cleda Daub

## 2018-02-05 NOTE — Telephone Encounter (Signed)
Spoke w/ pt, she states her pain is getting worse it is all over her body, she is taking meds as directed and they are not helping, [facility] 1415 5/8

## 2018-02-05 NOTE — Telephone Encounter (Signed)
Patient is requesting refills pain medicine and muscle relaxer isn't working for the pain that she is having. Pls contact

## 2018-02-05 NOTE — Patient Instructions (Addendum)
I am sorry you are feeling so poorly today Diana Huynh. Diana Quin do not seem to have gotten much relief with the muscle relaxant or diclofenac so we can try a different type of medicine today.  Start taking duloxetine (cymbalta)  once daily. This medicine can take a week or two to have its effect so please keep using it even if you do not get an immediate benefit.  I would like to see you again in a few weeks or sooner if you need [place]. Please call if you need additional documentation for work.

## 2018-02-07 ENCOUNTER — Encounter: Payer: Self-pay | Admitting: Internal Medicine

## 2018-02-07 NOTE — Assessment & Plan Note (Signed)
She is having some central swelling and weight gain from the ongoing high dose steroids. Her pain all over today really spares the joints themselves and has not gotten better with steroids so her lupus does not seem to explain this well. I question if there is another process such as deconditioning, somatization, or something like fibromyalgia. I also agree with her proceeding to getting a rheumatology evaluation at [facility] for her lupus. Most often symptoms should respond to doses of  or less prednisone so I do not see any reason to go back up on this today. Plan: Start duloxetine  daily

## 2018-02-10 ENCOUNTER — Encounter: Payer: Self-pay | Admitting: Internal Medicine

## 2018-02-10 ENCOUNTER — Other Ambulatory Visit: Payer: Self-pay

## 2018-02-10 ENCOUNTER — Ambulatory Visit (INDEPENDENT_AMBULATORY_CARE_PROVIDER_SITE_OTHER): Payer: Self-pay | Admitting: Internal Medicine

## 2018-02-10 VITALS — BP 141/79 | HR 98 | Temp 99.2°F | Ht 61.0 in | Wt 162.3 lb

## 2018-02-10 DIAGNOSIS — Z7952 Long term (current) use of systemic steroids: Secondary | ICD-10-CM

## 2018-02-10 DIAGNOSIS — G4700 Insomnia, unspecified: Secondary | ICD-10-CM

## 2018-02-10 DIAGNOSIS — J0110 Acute frontal sinusitis, unspecified: Secondary | ICD-10-CM | POA: Insufficient documentation

## 2018-02-10 MED ORDER — AMOXICILLIN-POT CLAVULANATE 875-125 MG PO TABS: 1.0000 | ORAL_TABLET | Freq: Two times a day (BID) | ORAL | 0 refills | Status: AC

## 2018-02-10 MED ORDER — BENZONATATE 200 MG PO CAPS: 200.0000 mg | ORAL_CAPSULE | Freq: Three times a day (TID) | ORAL | 0 refills | Status: DC | PRN

## 2018-02-10 MED FILL — AMOX-CLAV 875-125 MG TABLET: 875-125 | 7 days supply | Qty: 14 | Fill #0

## 2018-02-10 NOTE — Progress Notes (Signed)
Internal Medicine Clinic Attending  Case discussed with Dr. Rice at the time of the visit.  We reviewed the resident's history and exam and pertinent patient test results.  I agree with the assessment, diagnosis, and plan of care documented in the resident's note.  

## 2018-02-10 NOTE — Patient Instructions (Signed)
Your symptoms seem most consistent with a sinus infection at this time. This is often caused by a viral infection like the cold or flu but sometimes can be a bacterial infection. Because of your steroid treatment you are a higher risk for infection so I recommend taking antibiotics at this time. I prescribed Augmentin (amoxicillin-clavulanate) twice daily for 7 days. You can also use cough medications as needed for symptom relief in the meantime. I prescribed one but OTC options are also fine. This should get better within the next week but let us know if it does not start resolving with treatment.

## 2018-02-10 NOTE — Assessment & Plan Note (Signed)
Her symptoms are consistent with acute frontal sinusitis.  Clear lungs on exam, no dyspnea the extent of upper respiratory complaints makes a lower respiratory tract infection less likely.  She does not have any fever although is taking a moderate to high dose of daily prednisone which could be suppressing this.  Due to her immunosuppression for greater than 6 weeks I have some concern at her risk of complication even though this is most likely a viral infection.  As such I will empirically treat this for bacterial sinusitis though it does not meet normal criteria. Plan: Start Augmentin 875-125 twice daily for 7 days Tessalon or OTC cough medicine as needed Provided notice for reduced initial continuous work hours Recommended follow-up in 10 to 14 days if symptoms not significantly better

## 2018-02-10 NOTE — Progress Notes (Signed)
   CC: Follow up for diffuse pains  HPI:  Diana Huynh is a 37 y.o. female with PMHx detailed below presenting due to feeling persistent fatigue, myalgias, chills, sinus pain and headache, and cough productive of green sputum.  She was already feeling generally poorly last week but developed cough since Friday morning.  She is feeling chills and checked her temperature at home multiple times with no measurements greater than 100 Fahrenheit.  She tried return to work on Friday but only tolerated about 2 hours before returning home due to severity of headache and fatigue.  She is been somewhat nauseated with decreased appetite without vomiting.  She is not suffering any chest pain or shortness of breath.  See problem based assessment and plan below for additional details.  Acute non-recurrent frontal sinusitis Her symptoms are consistent with acute frontal sinusitis.  Clear lungs on exam, no dyspnea the extent of upper respiratory complaints makes a lower respiratory tract infection less likely.  She does not have any fever although is taking a moderate to high dose of daily prednisone which could be suppressing this.  Due to her immunosuppression for greater than 6 weeks I have some concern at her risk of complication even though this is most likely a viral infection.  As such I will empirically treat this for bacterial sinusitis though it does not meet normal criteria. Plan: Start Augmentin 875-125 twice daily for 7 days Tessalon or OTC cough medicine as needed Provided notice for reduced initial continuous work hours Recommended follow-up in 10 to 14 days if symptoms not significantly better   Past Medical History:  Diagnosis Date  . Bronchitis   . Pleuritis 12/31/2017    Review of Systems: Review of Systems  Constitutional: Positive for chills and malaise/fatigue.  HENT: Positive for congestion, sinus pain and sore throat.   Eyes: Negative for blurred vision and discharge.    Respiratory: Positive for cough and sputum production. Negative for hemoptysis and shortness of breath.   Cardiovascular: Negative for chest pain and leg swelling.  Gastrointestinal: Positive for nausea. Negative for diarrhea and vomiting.  Genitourinary: Negative for dysuria and urgency.  Musculoskeletal: Positive for myalgias.  Skin: Negative for rash.  Neurological: Positive for headaches. Negative for sensory change.  Psychiatric/Behavioral: The patient has insomnia.      Physical Exam: Vitals:   02/10/18 0921  BP: (!) 141/79  Pulse: 98  Temp: 99.2 F (37.3 C)  TempSrc: Oral  SpO2: 100%  Weight: 162 lb 4.8 oz (73.6 kg)  Height:  (1.549 m)   GENERAL-fatigued and uncomfortable, in no acute distress HEENT-conjunctivae noninjected, tympanic membranes are clear bilaterally, facial plethora, tenderness to palpation over right maxillary sinus, difficult to palpate but tender right greater than left anterior cervical and supraclavicular lymph nodes CARDIAC- RRR, no murmurs, rubs or gallops. RESP- CTAB, no wheezes or crackles. ABDOMEN- Soft, nontender BACK- Normal curvature, no para EXTREMITIES- Symmetric, no pedal edema. SKIN- Warm, dry, No rash or lesion. PSYCH- Normal mood and affect, appropriate thought content and speech.   Assessment & Plan:   See encounters tab for problem based medical decision making.   Patient discussed with Dr. Sandre Kitty

## 2018-02-12 NOTE — Progress Notes (Signed)
Internal Medicine Clinic Attending  Case discussed with Dr. Rice  at the time of the visit.  We reviewed the resident's history and exam and pertinent patient test results.  I agree with the assessment, diagnosis, and plan of care documented in the resident's note.  Alexander N Raines, MD   

## 2018-02-19 ENCOUNTER — Other Ambulatory Visit: Payer: Self-pay

## 2018-02-19 ENCOUNTER — Ambulatory Visit (INDEPENDENT_AMBULATORY_CARE_PROVIDER_SITE_OTHER): Payer: Self-pay | Admitting: Internal Medicine

## 2018-02-19 VITALS — BP 127/70 | HR 106 | Temp 98.2°F | Ht 61.0 in | Wt 162.1 lb

## 2018-02-19 DIAGNOSIS — R197 Diarrhea, unspecified: Secondary | ICD-10-CM

## 2018-02-19 DIAGNOSIS — Z79899 Other long term (current) drug therapy: Secondary | ICD-10-CM

## 2018-02-19 DIAGNOSIS — M329 Systemic lupus erythematosus, unspecified: Secondary | ICD-10-CM

## 2018-02-19 DIAGNOSIS — M328 Other forms of systemic lupus erythematosus: Secondary | ICD-10-CM

## 2018-02-19 DIAGNOSIS — Z7952 Long term (current) use of systemic steroids: Secondary | ICD-10-CM

## 2018-02-19 DIAGNOSIS — M25461 Effusion, right knee: Secondary | ICD-10-CM

## 2018-02-19 DIAGNOSIS — J0110 Acute frontal sinusitis, unspecified: Secondary | ICD-10-CM

## 2018-02-19 MED ORDER — HYDROXYCHLOROQUINE SULFATE 200 MG PO TABS: 200.0000 mg | ORAL_TABLET | Freq: Every day | ORAL | 0 refills | Status: DC

## 2018-02-19 MED ORDER — DULOXETINE HCL 30 MG PO CPEP: 30.0000 mg | ORAL_CAPSULE | Freq: Every day | ORAL | 0 refills | Status: DC

## 2018-02-19 MED ORDER — PREDNISONE 5 MG PO TABS: ORAL_TABLET | ORAL | 1 refills | Status: DC

## 2018-02-19 MED FILL — CYCLOBENZAPRINE 5 MG TABLET: 5 | 20 days supply | Qty: 60 | Fill #0

## 2018-02-19 MED FILL — HYDROXYCHLOROQUINE SULFATE: 200 | 30 days supply | Qty: 30 | Fill #0

## 2018-02-19 NOTE — Patient Instructions (Signed)
I think you have cleared up your sinus infection well.  Some of your fatigue and aches might be from the increased work schedule but I am also concerned it is due to the decrease in your steroids being too quick.  I recommend going back up to the previous dose and then we can taper it more slowly at increments of 5 mg.  It might be that we will have to stop the taper again when we get to a low dose before we can get off the medicine completely.

## 2018-02-19 NOTE — Progress Notes (Signed)
   CC: Follow up at 2 weeks for sinusitis  HPI:  [name] is a 37 y.o. female with PMHx detailed below presenting in follow-up after recent visit for sinusitis.  Since then her fever sinus pain and congestion fully resolved.  She had transient diarrhea while taking antibiotics but afterwards resolved.  She is back to work on reduced length shifts and has been working for about 10 days straight.  She is continued with her prednisone taper down to 10 mg daily since last week.  For 3 days she has been having increased aches and fatigue again particularly in her right hand and knees.  See problem based assessment and plan below for additional details.  Acute non-recurrent frontal sinusitis Her sinusitis symptoms are well resolved today.  She tolerated the oral antibiotics with only mild diarrhea which has resolved.  SLE (systemic lupus erythematosus) (HCC) I believe her increased fatigue and pains are more associated with the rapid steroid taper and her increased work schedule, although this could be contributory.  She has some evidence of synovitis on exam supporting ongoing disease activity.  Overall she has been somewhat slow to recover and be able to reduce steroids, however this is not unusual considering the severity of initial presentation with pericarditis.  She is still taking the Plaquenil 200 mg daily.  We may not achieve complete discontinuation of corticosteroids without getting her started on additional immunomodulatory medicine. Plan: I instructed her to go back to the 20 mg prednisone dose for a week before trying to rechallenge tapering but at 5 mg increments Continue Plaquenil 200 mg daily Encouraged her to pursue follow-up appointment at previously recommend rheumatology referral to Diana Huynh   Past Medical History:  Diagnosis Date  . Bronchitis   . Pleuritis 12/31/2017    Review of Systems: Review of Systems  Constitutional: Positive for malaise/fatigue.  Negative for chills and fever.  Respiratory: Negative for shortness of breath.   Cardiovascular: Negative for chest pain and palpitations.  Gastrointestinal: Positive for diarrhea.  Musculoskeletal: Positive for joint pain and myalgias. Negative for falls.  Skin: Negative for rash.  Neurological: Negative for dizziness and headaches.     Physical Exam: Vitals:   02/19/18 1104  BP: 127/70  Pulse: (!) 106  Temp: 98.2 F (36.8 C)  TempSrc: Oral  SpO2: 100%  Weight: 162 lb 1.6 oz (73.5 kg)  Height:  (1.549 m)   GENERAL- alert, co-operative, NAD HEENT- facial plethora, no oropharyngeal erythema, no sinus tenderness to palpation CARDIAC-tachycardic with regular rhythm, no murmurs RESP- CTAB, no wheezes or crackles. EXTREMITIES-some right knee medial joint line tenderness, small effusion without warmth, no pedal edema. No swelling or erythema in the right hand SKIN- Warm, dry, No rash or lesion. PSYCH- Normal mood and affect, appropriate thought content and speech.   Assessment & Plan:   See encounters tab for problem based medical decision making.   Patient discussed with Dr. Cyndie Chime

## 2018-02-20 ENCOUNTER — Telehealth: Payer: Self-pay | Admitting: Internal Medicine

## 2018-02-20 NOTE — Telephone Encounter (Signed)
New letter printed and signed today indicating continued need for work restriction after 5/22 visit.

## 2018-02-20 NOTE — Telephone Encounter (Signed)
Pt seen yesterday and is requesting an updated Restriction Letter to return to work per her Supervisor.  Pt's last letter was for her to return on 02/11/2018.  Pt's employer is needing a new letter provided because she was seen again on yesterday 02/19/2018 and is still needing light duty.

## 2018-02-21 NOTE — Progress Notes (Signed)
Medicine attending: Medical history, presenting problems, physical findings, and medications, reviewed with resident physician Dr Christopher Rice on the day of the patient visit and I concur with his evaluation and management plan. 

## 2018-02-21 NOTE — Assessment & Plan Note (Signed)
Diana Huynh believe Diana Huynh increased fatigue and pains are more associated with the rapid steroid taper and Diana Huynh increased work schedule, although this could be contributory.  Diana Huynh has some evidence of synovitis on exam supporting ongoing disease activity.  Overall Diana Huynh has been somewhat slow to recover and be able to reduce steroids, however this is not unusual considering the severity of initial presentation with pericarditis.  Diana Huynh is still taking the Plaquenil 200 mg daily.  Diana Huynh may not achieve complete discontinuation of corticosteroids without getting Diana Huynh started on additional immunomodulatory medicine. Plan: Diana Huynh instructed Diana Huynh to go back to the 20 mg prednisone dose for a week before trying to rechallenge tapering but at 5 mg increments Continue Plaquenil 200 mg daily Encouraged Diana Huynh to pursue follow-up appointment at previously recommend rheumatology referral to Dr. Sharmon Revere

## 2018-02-21 NOTE — Assessment & Plan Note (Signed)
Her sinusitis symptoms are well resolved today.  She tolerated the oral antibiotics with only mild diarrhea which has resolved.

## 2018-02-26 ENCOUNTER — Ambulatory Visit: Payer: Medicaid Other

## 2018-03-06 DIAGNOSIS — M328 Other forms of systemic lupus erythematosus: Secondary | ICD-10-CM | POA: Insufficient documentation

## 2018-03-06 DIAGNOSIS — Z79899 Other long term (current) drug therapy: Secondary | ICD-10-CM | POA: Insufficient documentation

## 2018-03-07 DIAGNOSIS — Z720 Tobacco use: Secondary | ICD-10-CM | POA: Insufficient documentation

## 2018-03-18 ENCOUNTER — Ambulatory Visit: Payer: Medicaid Other

## 2018-03-26 ENCOUNTER — Other Ambulatory Visit: Payer: Self-pay | Admitting: Internal Medicine

## 2018-03-26 DIAGNOSIS — M328 Other forms of systemic lupus erythematosus: Secondary | ICD-10-CM

## 2018-03-31 NOTE — Progress Notes (Deleted)
Cardiology Office Note:    Date:  03/31/2018   ID:  Diana Huynh, DOB [birth year], MRN [id number]  PCP:  Lorenso Courier, MD  Cardiologist:  Kristeen Miss, MD   Referring MD: No ref. provider found   No chief complaint on file. ***  History of Present Illness:    Diana Huynh is a 37 y.o. female with systemic lupus erythematosus with associated pericarditis in the past.  She was admitted in March 2019 with acute pericarditis and diagnosed with systemic lupus erythematosus at that time.  She was last seen in May 2019.    Diana Huynh ***  Prior CV studies:   The following studies were reviewed today:  Abdominal CT 01/05/2018 IMPRESSION: 1. Trace left effusion with adjacent left lower lobe atelectasis. Mild cardiac enlargement. Resolution of pericardial effusion. 2. Congenital malrotation of the right kidney. Probable under distention of the bladder counting for circumferential thickening of the bladder wall. Correlate to exclude cystitis. 3. Moderate colonic stool burden without bowel obstruction or Inflammation.  Echo 12/27/2017 EF 60-65, normal wall motion, trivial MR, small pericardial effusion  Chest CTA 12/27/2017 IMPRESSION: 1. Small to moderate pericardial effusion. Although limited by CTA timing, suspect there is associated serosal thickening and underlying pericarditis. 2. Trace left pleural effusion, possibly from serositis. 3. Atelectasis. 4. Negative for pulmonary embolism.  Past Medical History:  Diagnosis Date  . Bronchitis   . Pleuritis 12/31/2017   Surgical Hx: The patient  has a past surgical history that includes Appendectomy; Ankle surgery; and Cesarean section.   Current Medications: No outpatient medications have been marked as taking for the 04/01/18 encounter (Appointment) with Tereso Newcomer T, PA-C.     Allergies:   Patient has no known allergies.   Social History   Tobacco Use  . Smoking status: Former Smoker    Packs/day: 0.25    Types:  Cigarettes  . Smokeless tobacco: Never Used  . Tobacco comment: quit 12/27/2017   Substance Use Topics  . Alcohol use: Yes    Comment: 1 per week  . Drug use: No     Family Hx: The patient's family history is not on file.  ROS:   Please see the history of present illness.    ROS All other systems reviewed and are negative.   EKGs/Labs/Other Test Reviewed:    EKG:  EKG is *** ordered today.  The ekg ordered today demonstrates ***  Recent Labs: 12/26/2017: B Natriuretic Peptide 28.6 12/27/2017: TSH 1.582 01/30/2018: ALT 26; BUN 12; Creatinine, Ser 0.79; Hemoglobin 12.5; Platelets 339; Potassium 4.2; Sodium 143   Recent Lipid Panel Lab Results  Component Value Date/Time   CHOL 119 12/27/2017 05:06 PM   TRIG 41 12/27/2017 05:06 PM   HDL 31 (L) 12/27/2017 05:06 PM   CHOLHDL 3.8 12/27/2017 05:06 PM   LDLCALC 80 12/27/2017 05:06 PM    Physical Exam:    VS:  There were no vitals taken for this visit.    Wt Readings from Last 3 Encounters:  02/19/18 162 lb 1.6 oz (73.5 kg)  02/10/18 162 lb 4.8 oz (73.6 kg)  02/05/18 165 lb 11.2 oz (75.2 kg)     ***Physical Exam  ASSESSMENT & PLAN:    Pericarditis associated with systemic lupus erythematosus, unspecified chronicity (HCC)***  #1.  Pericarditis associated with systemic lupus erythematosus, unspecified chronicity (HCC) Symptomatically improved on prednisone and colchicine.  She is also on Hydroxychloroquine per primary care.  She is trying to get in to see Rheumatology in [place] with  [facility].  We typically keep patients on Colchicine for 3 mos to reduce recurrence of pericarditis.  If Rheumatology wants her to remain on colchicine longer, I would defer to their expertise.  Her prednisone will be managed by primary care and Rheumatology.  She has a non-productive cough when she lies flat.  Question if this is related to acid reflux.               -Continue colchicine for at least 3 months (longer depending on recommendations from  rheumatology)             -Prednisone, hydroxychloroquine management per primary care/rheumatology             -Plan 4125-month follow-up (probable PRN follow up afterward)             -Discontinue colchicine at next visit, unless she requires longer therapy             -Obtain follow-up CBC             -Increase Pepcid to bid for GI protection  #2.  Pericardial effusion   Resolved on most recent CT scan.  Dispo:  No follow-ups on file.   Medication Adjustments/Labs and Tests Ordered: Current medicines are reviewed at length with the patient today.  Concerns regarding medicines are outlined above.  Tests Ordered: No orders of the defined types were placed in this encounter.  Medication Changes: No orders of the defined types were placed in this encounter.   Signed, Tereso NewcomerScott Slayton Lubitz, PA-C  03/31/2018 8:56 PM    [facility] [address], [place], [place]  [address] Phone: [phone]; Fax: [phone]

## 2018-04-01 ENCOUNTER — Ambulatory Visit: Payer: Self-pay | Admitting: Physician Assistant

## 2018-04-09 ENCOUNTER — Encounter: Payer: Self-pay | Admitting: Internal Medicine

## 2018-04-09 ENCOUNTER — Ambulatory Visit (INDEPENDENT_AMBULATORY_CARE_PROVIDER_SITE_OTHER): Payer: Self-pay | Admitting: Internal Medicine

## 2018-04-09 ENCOUNTER — Emergency Department (HOSPITAL_COMMUNITY)
Admission: EM | Admit: 2018-04-09 | Discharge: 2018-04-09 | Disposition: A | Discharge status: Home / Self Care | Payer: Self-pay | Attending: Emergency Medicine | Admitting: Emergency Medicine

## 2018-04-09 ENCOUNTER — Encounter (HOSPITAL_COMMUNITY): Payer: Self-pay | Admitting: Emergency Medicine

## 2018-04-09 ENCOUNTER — Emergency Department (HOSPITAL_COMMUNITY): Payer: Self-pay

## 2018-04-09 ENCOUNTER — Telehealth: Payer: Self-pay | Admitting: *Deleted

## 2018-04-09 ENCOUNTER — Other Ambulatory Visit: Payer: Self-pay

## 2018-04-09 VITALS — BP 139/71 | HR 62 | Temp 97.5°F | Ht 61.0 in | Wt 167.2 lb

## 2018-04-09 DIAGNOSIS — Z79899 Other long term (current) drug therapy: Secondary | ICD-10-CM | POA: Insufficient documentation

## 2018-04-09 DIAGNOSIS — Z87891 Personal history of nicotine dependence: Secondary | ICD-10-CM | POA: Insufficient documentation

## 2018-04-09 DIAGNOSIS — M329 Systemic lupus erythematosus, unspecified: Secondary | ICD-10-CM

## 2018-04-09 DIAGNOSIS — Z7952 Long term (current) use of systemic steroids: Secondary | ICD-10-CM

## 2018-04-09 DIAGNOSIS — M3210 Systemic lupus erythematosus, organ or system involvement unspecified: Secondary | ICD-10-CM | POA: Insufficient documentation

## 2018-04-09 DIAGNOSIS — M3212 Pericarditis in systemic lupus erythematosus: Secondary | ICD-10-CM

## 2018-04-09 HISTORY — DX: Systemic lupus erythematosus, unspecified: M32.9

## 2018-04-09 HISTORY — DX: Reserved for concepts with insufficient information to code with codable children: IMO0002

## 2018-04-09 LAB — CBC WITH DIFFERENTIAL/PLATELET
BASOS PCT: 1 %
Basophils Absolute: 0.1 10*3/uL (ref 0.0–0.1)
Eosinophils Absolute: 0.1 10*3/uL (ref 0.0–0.7)
Eosinophils Relative: 1 %
HEMATOCRIT: 38.8 % (ref 36.0–46.0)
HEMOGLOBIN: 12.4 g/dL (ref 12.0–15.0)
Lymphocytes Relative: 35 %
Lymphs Abs: 2.8 10*3/uL (ref 0.7–4.0)
MCH: 28.1 pg (ref 26.0–34.0)
MCHC: 32 g/dL (ref 30.0–36.0)
MCV: 87.8 fL (ref 78.0–100.0)
MONO ABS: 0.6 10*3/uL (ref 0.1–1.0)
MONOS PCT: 8 %
NEUTROS PCT: 55 %
Neutro Abs: 4.3 10*3/uL (ref 1.7–7.7)
PLATELETS: 246 10*3/uL (ref 150–400)
RBC: 4.42 MIL/uL (ref 3.87–5.11)
RDW: 15.7 % — ABNORMAL HIGH (ref 11.5–15.5)
WBC: 7.9 10*3/uL (ref 4.0–10.5)

## 2018-04-09 LAB — BASIC METABOLIC PANEL
ANION GAP: 7 (ref 5–15)
BUN: 6 mg/dL (ref 6–20)
CHLORIDE: 103 mmol/L (ref 98–111)
CO2: 26 mmol/L (ref 22–32)
Calcium: 9 mg/dL (ref 8.9–10.3)
Creatinine, Ser: 0.82 mg/dL (ref 0.44–1.00)
GFR calc Af Amer: >60 mL/min (ref >60)
GFR calc non Af Amer: >60 mL/min (ref >60)
GLUCOSE: 106 mg/dL — AB (ref 70–99)
POTASSIUM: 3.1 mmol/L — AB (ref 3.5–5.1)
Sodium: 136 mmol/L (ref 135–145)

## 2018-04-09 MED ORDER — PREDNISONE 10 MG PO TABS
10.0000 mg | ORAL_TABLET | Freq: Every day | ORAL | 0 refills | Status: DC
Start: 2018-04-09 — End: 2018-05-09

## 2018-04-09 MED ORDER — OXYCODONE-ACETAMINOPHEN 5-325 MG PO TABS: 1.0000 | ORAL_TABLET | Freq: Two times a day (BID) | ORAL | 0 refills | Status: DC | PRN

## 2018-04-09 MED ORDER — OXYCODONE-ACETAMINOPHEN 5-325 MG PO TABS
1.0000 | ORAL_TABLET | Freq: Once | ORAL | Status: AC
  Administered 2018-04-09: 1 via ORAL
  Filled 2018-04-09: qty 1

## 2018-04-09 NOTE — Telephone Encounter (Signed)
Call from pt - stated she's being discharged from the ED and was told if she needs additional pain medication, to call her doctor. Stated she's having a flare-up; hx of Lupus - informed we do not prescribe pain med over the phone; she has to be seen. [facility]t given for today @ 1445PM. Pt agreed. [facility] informed.

## 2018-04-09 NOTE — Discharge Instructions (Addendum)
Please read attached information. If you experience any new or worsening signs or symptoms please return to the emergency room for evaluation. Please follow-up with your primary care provider or specialist as discussed.  °

## 2018-04-09 NOTE — Assessment & Plan Note (Signed)
-   Pt having current SLE flare  - Went to the ED this morning for assessment and was told to see PCP - Began on 04/06/2018 to have painful cramps all over her body - Gradually worsening pain on upper back, shoulders, chest, lower legs, and flank - States she's had similar episodes in the past but with reduced severity - on 7.5mg  prednisone daily and plaquenil 200mg  daily - Had [facility]: was on 10mg  until 04/04/2018 - Start 20mg  prednisone daily, c/w plaquenil - Percocet 5-325 tab q12 PRN for pain - Recommend following up with her rheumatologist

## 2018-04-09 NOTE — Patient Instructions (Addendum)
Dear Diana Huynh  You came to the clinic with complaints of painful flare up of Lupus. We are prescribing you a stronger dose of your prednisone. Please take 20mg  (2 of the 10mg  tablets) for 7 days and then change down to 10mg  daily. Also we are prescribing you oxycodone for your pain. Please take twice a day only as needed. Thank you

## 2018-04-09 NOTE — ED Notes (Signed)
Pt back from radiology 

## 2018-04-09 NOTE — Progress Notes (Signed)
   CC: SLE flare up  HPI: [name] is a 37 y.o. F w/ PMH of Systemic Lupus Erythematous presenting with acute painful flare up w/ myalgias. She states she was in her usual state of health until 04/06/2018 when she began to have painful cramps all over her body. She says she has had these episodes in the past but this time the severity was much worse. She states the pain is focused on upper back, shoulders, chest, lower legs and flank as well as headaches. Pain is described as 'worse pain ever' with gradual increase in intensity. She states she recently titrated down her prednisone dose from 10mg  to 7.5mg  on 04/04/2018. She visited the ED this morning for management but was told she needs to follow up with her PCP. She denies palpitations/ shortness of breath/ vomiting, nausea, diarrhea, constipation.  Past Medical History:  Diagnosis Date  . Bronchitis   . Lupus (HCC)   . Pleuritis 12/31/2017    Review of Systems: Review of Systems  Constitutional: Positive for diaphoresis and malaise/fatigue. Negative for chills and fever.  Cardiovascular: Positive for chest pain. Negative for palpitations.  Gastrointestinal: Positive for abdominal pain. Negative for nausea and vomiting.  Musculoskeletal: Positive for back pain, joint pain, myalgias and neck pain.  Skin: Positive for itching and rash.    Physical Exam: Vitals:   04/09/18 1501  BP: 139/71  Pulse: 62  Temp: (!) 97.5 F (36.4 C)  TempSrc: Oral  Weight: 167 lb 3.2 oz (75.8 kg)  Height: 5\' 1"  (1.549 m)    Physical Exam  Constitutional: She appears well-developed and well-nourished. She appears distressed.  Cardiovascular: Normal rate, regular rhythm and normal heart sounds.  Peripheral pulses present but weak. Extremities feel cold compared to central body temp  Respiratory: Effort normal and breath sounds normal. No respiratory distress. She has no wheezes. She exhibits no tenderness.  GI: Soft. Bowel sounds are normal. There is  tenderness. There is no guarding.  Musculoskeletal: Normal range of motion. She exhibits edema and tenderness.      Assessment & Plan:   See Encounters Tab for problem based charting.  Patient seen with Dr. Oswaldo DoneVincent   -Diana CornfieldJoshua Diana Huynh, PGY1

## 2018-04-09 NOTE — ED Provider Notes (Signed)
[facility] EMERGENCY DEPARTMENT Provider Note   CSN: [id number] Arrival date & time: 04/09/18  1005   History   Chief Complaint Chief Complaint  Patient presents with  . Generalized Body Aches  . Back Pain  . Headache    HPI Diana Huynh is a 37 y.o. female.  HPI    37 year old female with a asked medical history of systemic lupus erythematous presents today with pain. Patient notes approximately 3 days ago she began have painful cramps throughout her body, worse on the right side. She notes this is similar to previous episodes of lupus-related pain but more severe. Patient notes that she had not tried any medication for this pain prior to arrival in the emergency room. Patient notes that she does take prednisone and has currently tapered down to 7.5 mg, also taking 5 Tylenol although she did this 4 days of dose over the last week. Patient reports feeling hot over the last several days, but denies any fever, denies any shortness of breath, reports some anterior base chest wall pain. Patient does endorse minor cough.    Past Medical History:  Diagnosis Date  . Bronchitis   . Lupus (HCC)   . Pleuritis 12/31/2017    Patient Active Problem List   Diagnosis Date Noted  . Acute non-recurrent frontal sinusitis 02/10/2018  . Viral pericarditis 01/06/2018  . SLE (systemic lupus erythematosus) (HCC) 12/31/2017  . Pericardial effusion     Past Surgical History:  Procedure Laterality Date  . ANKLE SURGERY    . APPENDECTOMY    . CESAREAN SECTION       OB History   None      Home Medications    Prior to Admission medications   Medication Sig Start Date End Date Taking? Authorizing Provider  alum & mag hydroxide-simeth (MAALOX REGULAR STRENGTH) 200-200-20 MG/5ML suspension Take 30 mLs by mouth every 6 (six) hours as needed for indigestion or heartburn. 06/30/17   CardamaAmadeo Garnet, MD  benzonatate (TESSALON) 200 MG capsule Take 1 capsule (200 mg total)  by mouth 3 (three) times daily as needed for cough. 02/10/18   Rice, Jamesetta Orleans, MD  cyclobenzaprine (FLEXERIL) 5 MG tablet Take 1 tablet (5 mg total) by mouth 3 (three) times daily as needed for muscle spasms. 01/30/18   Angelita Ingles, MD  diclofenac (VOLTAREN) 50 MG EC tablet Take one 50 mg tablet with meals 2-3 times daily as needed for joint aches and pains 01/30/18   Angelita Ingles, MD  DULoxetine (CYMBALTA) 30 MG capsule Take 1 capsule (30 mg total) by mouth daily. 02/28/18   Rice, Jamesetta Orleans, MD  famotidine (PEPCID) 20 MG tablet Take 1 tablet (20 mg total) by mouth 2 (two) times daily. 01/29/18   Tereso Newcomer T, PA-C  hydroxychloroquine (PLAQUENIL) 200 MG tablet TAKE 1 TABLET (200 MG TOTAL) BY MOUTH DAILY. 03/26/18   Chundi, Sherlyn Lees, MD  ondansetron (ZOFRAN ODT) 4 MG disintegrating tablet Take 1 tablet (4 mg total) by mouth every 6 (six) hours as needed. 01/05/18   Ward, Layla Maw, DO  oxyCODONE-acetaminophen (PERCOCET/ROXICET) 5-325 MG tablet Take 1 tablet by mouth every 12 (twelve) hours as needed for severe pain. 04/09/18   Tyson Alias, MD  predniSONE (DELTASONE) 10 MG tablet Take 1 tablet (10 mg total) by mouth daily with breakfast. Take 2 tablets (20mg ) for 7 days and then change to 10mg  daily 04/09/18   Theotis Barrio, MD  predniSONE (DELTASONE) 5 MG tablet Increase  your daily dose back to at least 20mg  then taper by increments of 5mg  weekly. 02/19/18   Fuller Plan, MD    Family History No family history on file.  Social History Social History   Tobacco Use  . Smoking status: Former Smoker    Packs/day: 0.25    Types: Cigarettes  . Smokeless tobacco: Never Used  . Tobacco comment: quit 12/27/2017   Substance Use Topics  . Alcohol use: Yes    Comment: 1 per week  . Drug use: No     Allergies   Patient has no known allergies.   Review of Systems Review of Systems  All other systems reviewed and are negative.    Physical Exam Updated Vital  Signs BP 134/67   Pulse 78   Temp 98.3 F (36.8 C) (Oral)   Resp 20   Ht 5\' 1"  (1.549 m)   Wt 74.8 kg (165 lb)   LMP 03/21/2018   SpO2 98%   BMI 31.18 kg/m   Physical Exam  Constitutional: She is oriented to person, place, and time. She appears well-developed and well-nourished.  HENT:  Head: Normocephalic and atraumatic.  Eyes: Pupils are equal, round, and reactive to light. Conjunctivae are normal. Right eye exhibits no discharge. Left eye exhibits no discharge. No scleral icterus.  Neck: Normal range of motion. No JVD present. No tracheal deviation present.  Cardiovascular: Normal rate, regular rhythm, normal heart sounds and intact distal pulses.  Pulmonary/Chest: Effort normal and breath sounds normal. No stridor. She has no wheezes. She has no rales. She exhibits tenderness.  Tenderness noted to the anterior chest wall  Musculoskeletal:  Extremities tender to palpation with even light palpation, no significant edema noted, extremities are well-perfused although slightly cooler than torso temperature  Neurological: She is alert and oriented to person, place, and time. Coordination normal.  Skin: Skin is warm.  Psychiatric: She has a normal mood and affect. Her behavior is normal. Judgment and thought content normal.  Nursing note and vitals reviewed.   ED Treatments / Results  Labs (all labs ordered are listed, but only abnormal results are displayed) Labs Reviewed  CBC WITH DIFFERENTIAL/PLATELET - Abnormal; Notable for the following components:      Result Value   RDW 15.7 (*)    All other components within normal limits  BASIC METABOLIC PANEL - Abnormal; Notable for the following components:   Potassium 3.1 (*)    Glucose, Bld 106 (*)    All other components within normal limits    EKG None  Radiology Dg Chest 2 View  Result Date: 04/09/2018 CLINICAL DATA:  Chest pain. EXAM: CHEST - 2 VIEW COMPARISON:  Radiographs of January 05, 2018. FINDINGS: The heart size and  mediastinal contours are within normal limits. Both lungs are clear. No pneumothorax or pleural effusion is noted. The visualized skeletal structures are unremarkable. IMPRESSION: No active cardiopulmonary disease. Electronically Signed   By: Lupita Raider, M.D.   On: 04/09/2018 13:50    Procedures Procedures (including critical care time)  Medications Ordered in ED Medications  oxyCODONE-acetaminophen (PERCOCET/ROXICET) 5-325 MG per tablet 1 tablet (1 tablet Oral Given 04/09/18 1326)  oxyCODONE-acetaminophen (PERCOCET/ROXICET) 5-325 MG per tablet 1 tablet (1 tablet Oral Given 04/09/18 1422)     Initial Impression / Assessment and Plan / ED Course  I have reviewed the triage vital signs and the nursing notes.  Pertinent labs & imaging results that were available during my care of the patient were reviewed  by me and considered in my medical decision making (see chart for details).  Clinical Course as of Apr 09 2140  Wed Apr 09, 2018  1359 ED EKG [NL]  1359 ED EKG [NL]    Clinical Course User Index [NL] Laurice RecordLandry, Naomi P, Student-PA    Labs:  CBC, BMP  Imaging: DG chest 2 view  Consults:  Therapeutics: Percocet  Discharge Meds:   Assessment/Plan: [age] female presents today with likely lupus flare. Patient is currently on prednisone. She has been slowly tapered off this question if this is related to tapering of her steroids. Patient was given several doses of pain medicine here in the ED. I encouraged patient to follow up as an outpatient with her rheumatologist and primary care for management of her lupus, she is given strict return cautions, she verbalized understanding and agreement to today's plan.   Final Clinical Impressions(s) / ED Diagnoses   Final diagnoses:  Systemic lupus erythematosus, unspecified SLE type, unspecified organ involvement status [facility](HCC)    ED Discharge Orders    None       Rosalio LoudHedges, Luc Shammas, PA-C 04/09/18 2141    Melene PlanFloyd, Dan, DO 04/09/18  2200

## 2018-04-09 NOTE — ED Triage Notes (Signed)
Pt. Stated, I started having pain all over especially the rt. Side since yesterday. I have it often since I was dx with Lupus

## 2018-04-10 NOTE — Progress Notes (Signed)
Internal Medicine Clinic Attending  I saw and evaluated the patient.  I personally confirmed the key portions of the history and exam documented by Dr. Nedra HaiLee and I reviewed pertinent patient test results.  The assessment, diagnosis, and plan were formulated together and I agree with the documentation in the resident's note.  Painful lupus flare with increasing synovitis and pleuritis in the setting of tapering down steroids. Will bring prednisone back up to 20mg  daily for a week, then to 10mg  after that and I told her to stay at that level until she can follow up with her rheumatologist. Also gave 5 day course of oxycodone to help with the serositis-associated pain.

## 2018-04-11 NOTE — Telephone Encounter (Signed)
Thank you for letting me know

## 2018-04-16 ENCOUNTER — Telehealth: Payer: Self-pay | Admitting: Internal Medicine

## 2018-04-16 ENCOUNTER — Other Ambulatory Visit: Payer: Self-pay | Admitting: Internal Medicine

## 2018-04-16 MED ORDER — IBUPROFEN 600 MG PO TABS: 600.0000 mg | ORAL_TABLET | Freq: Three times a day (TID) | ORAL | 0 refills | Status: DC | PRN

## 2018-04-16 NOTE — Progress Notes (Signed)
The patient should follow with rheumatologist. She was prescribed percocet at previous visit. Ibuprofen 600mg  #30 was prescribed.   Lorenso CourierVahini Jatavion Peaster, MD Internal Medicine PGY2 Pager:[phone] 04/16/2018, 7:17 PM

## 2018-04-16 NOTE — Telephone Encounter (Signed)
Needs refill on ibuprofen 600mg  @ [facility] [place]; pt contact # [phone]

## 2018-04-17 ENCOUNTER — Ambulatory Visit (INDEPENDENT_AMBULATORY_CARE_PROVIDER_SITE_OTHER): Payer: Self-pay | Admitting: Internal Medicine

## 2018-04-17 ENCOUNTER — Encounter: Payer: Self-pay | Admitting: Internal Medicine

## 2018-04-17 ENCOUNTER — Telehealth: Payer: Self-pay | Admitting: Internal Medicine

## 2018-04-17 ENCOUNTER — Other Ambulatory Visit: Payer: Self-pay

## 2018-04-17 VITALS — BP 142/79 | HR 100 | Temp 99.6°F | Ht 65.0 in | Wt 165.3 lb

## 2018-04-17 DIAGNOSIS — M328 Other forms of systemic lupus erythematosus: Secondary | ICD-10-CM

## 2018-04-17 DIAGNOSIS — Z79899 Other long term (current) drug therapy: Secondary | ICD-10-CM

## 2018-04-17 DIAGNOSIS — M3219 Other organ or system involvement in systemic lupus erythematosus: Secondary | ICD-10-CM

## 2018-04-17 DIAGNOSIS — G8929 Other chronic pain: Secondary | ICD-10-CM

## 2018-04-17 DIAGNOSIS — M329 Systemic lupus erythematosus, unspecified: Secondary | ICD-10-CM

## 2018-04-17 DIAGNOSIS — Z7952 Long term (current) use of systemic steroids: Secondary | ICD-10-CM

## 2018-04-17 MED ORDER — DICLOFENAC SODIUM 1 % TD GEL: 2.0000 g | Freq: Four times a day (QID) | TRANSDERMAL | 0 refills | Status: DC

## 2018-04-17 MED ORDER — IBUPROFEN 600 MG PO TABS: 600.0000 mg | ORAL_TABLET | Freq: Three times a day (TID) | ORAL | 0 refills | Status: AC | PRN

## 2018-04-17 MED ORDER — CYCLOBENZAPRINE HCL 5 MG PO TABS: 5.0000 mg | ORAL_TABLET | Freq: Three times a day (TID) | ORAL | 1 refills | Status: DC | PRN

## 2018-04-17 NOTE — Progress Notes (Signed)
   CC: Lupus exacerbation  HPI: [name] is a 37 y.o. w/ PMH of SLE presenting to the clinic for myalgias as a result of her SLE exacerbation. She was at the clinic last week for the same complaints where her prednisone dose was increased to 20mg  from 10mg . She states she is still experiencing musculoskeletal pain all over her upper body with no decrease in severity or quality or location has drifted to her left side instead of her right side. Her pain is mostly concentrated around her left shoulder, left ribs and upper neck. She states that her short term trial of Percocet did not alleviate the pain and she was taken out of work due to inability to tolerate the pain. [facility] who recommend that she continue the prednisone and Plaquenil at their current doses and follow up at her next appointment with rheum on 06/19/18.  Past Medical History:  Diagnosis Date  . Bronchitis   . Lupus (HCC)   . Pleuritis 12/31/2017    Review of Systems: Review of Systems  Constitutional: Positive for malaise/fatigue. Negative for chills, fever and weight loss.  Eyes: Negative for blurred vision, double vision, pain and discharge.  Cardiovascular: Negative for chest pain, palpitations and leg swelling.  Gastrointestinal: Negative for constipation, diarrhea, nausea and vomiting.  Musculoskeletal: Positive for back pain, joint pain, myalgias and neck pain. Negative for falls.     Physical Exam: Vitals:   04/17/18 1338  BP: (!) 142/79  Pulse: 100  Temp: 99.6 F (37.6 C)  TempSrc: Oral  SpO2: 100%  Weight: 165 lb 4.8 oz (75 kg)  Height: 5\' 5"  (1.651 m)    Physical Exam  Constitutional: She is oriented to person, place, and time. She appears well-developed and well-nourished. She appears distressed.  HENT:  Head: Atraumatic.  Mouth/Throat: Oropharynx is clear and moist. No oropharyngeal exudate.  No mucosal ulcers  Cardiovascular: Normal rate, regular  rhythm, normal heart sounds and intact distal pulses.  Respiratory: Effort normal and breath sounds normal. No respiratory distress.  Musculoskeletal: She exhibits tenderness.  Tender to palpation on most of upper body including neck, shoulder, chest and movement of bilateral shoulders and elbows limited due to pain.  Neurological: She is alert and oriented to person, place, and time. She has normal reflexes. No cranial nerve deficit. Coordination normal.  Skin: She is diaphoretic.    Assessment & Plan:   See Encounters Tab for problem based charting.  Patient seen with Dr. Sandre Kittyaines  -Diana Huynh, PGY1

## 2018-04-17 NOTE — Telephone Encounter (Signed)
Attempted to reach out to patient's rheumatologist[facility]. Told by off-hour operator to call back during working hours unless it was an emergency because they do not take voicemail.

## 2018-04-17 NOTE — Assessment & Plan Note (Signed)
-   Pt continues to endorse pain 2/2 SLE flare - Last visit increased prednisone dose to 20mg  daily - Trial of opioid ineffective in controlling pain  - Will attempt to speak with [facility] about management - Refilled her Flexeril 5mg  TID PRN - Start Voltaren Gel on painful areas near joints - Re-sent ibuprofen 600mg  q8hr order to her preferred pharmacy

## 2018-04-17 NOTE — Patient Instructions (Signed)
[name]  We are sending some pain medication to help alleviate your muscle pain. Please pick these up at your pharmacy. Also make sure that you follow up with your rheumatologist on your next appointment. Thank you very much.  -Judeth CornfieldJoshua Trinitey Roache

## 2018-04-18 NOTE — Progress Notes (Signed)
Internal Medicine Clinic Attending  I saw and evaluated the patient.  I personally confirmed the key portions of the history and exam documented by Dr. Lee and I reviewed pertinent patient test results.  The assessment, diagnosis, and plan were formulated together and I agree with the documentation in the resident's note.  Alexander Raines, M.D., Ph.D.  

## 2018-04-24 NOTE — Progress Notes (Signed)
   CC: SLE Follow up  HPI:  Diana Huynh is a 37 y.o. with pericardial effusion and SLE who presents for sle follow up. Please see problem based charting for evaluation, assessment, and plan.  Past Medical History:  Diagnosis Date  . Bronchitis   . Lupus (HCC)   . Pleuritis 12/31/2017   Review of Systems:    Has back pain, shoulder pain, knee pain   Physical Exam:  Vitals:   04/25/18 1346  BP: 94/74  Pulse: 94  Temp: 98.1 F (36.7 C)  TempSrc: Oral  SpO2: 100%  Weight: 165 lb 8 oz (75.1 kg)  Height: 5\' 5"  (1.651 m)   Physical Exam  Constitutional: She appears well-developed and well-nourished. No distress.  HENT:  Head: Normocephalic and atraumatic.  Eyes: Conjunctivae are normal.  Cardiovascular: Normal rate, regular rhythm and normal heart sounds.  Respiratory: Effort normal and breath sounds normal. No respiratory distress. She has no wheezes.  GI: Soft. Bowel sounds are normal. She exhibits no distension. There is tenderness (tenderness to palpation in upper abdomen).  Musculoskeletal: She exhibits tenderness (tenderness to palpation at shoulder, knees, and elbows).  Skin: She is not diaphoretic. No erythema.  Psychiatric: She has a normal mood and affect. Her behavior is normal. Judgment and thought content normal.    Assessment & Plan:   See Encounters Tab for problem based charting.  Systemic lupus erythematous Patient was wearing her March 2019 admission.  She was initially started on colchicine.  She is subsequently placed on prednisone during flares.  Patient was most recently seen 1 week ago which time he was told to increase her dose of prednisone to 20 mg daily and was given 5-325 mg every 12 hours as needed.  States that over the past few days she has been having increased activity at work and also stressors at home which is made her lupus flare more.  She has not found much relief from using Tylenol or Advil and states that the Percocet helped  somewhat.  The patient is on hydroxychloroquine alternating 400 mg and 200 mg every other day.  Scheduled for follow-up with [facility] in September 2019.    Assessment and plan Since the patient has required high-dose glucocorticoids and continued pain medication it is likely that she will need an immunomodulatory occasions such as methotrexate.  Recommended the patient follow-up with her rheumatologist to discuss options.   Reminded patient that she needs to be seen by ophthalmologist for eye evaluation since she is on Plaquenil.  Recommended patient continue prednisone 20 mg daily  Follow-up in 3 weeks Prescribed Percocet 5-3 25 every 12 hours as needed #10   the patient is currently taking prednisone 10mg  daily, hydroxychloroquine 200mg  daily , cymbalta 30mg  daily, and fexeril 3mg  tid prn. She was prescribed percocet 5-325mg  #10 which did not provide relief.       Patient discussed with Dr. Oswaldo DoneVincent

## 2018-04-25 ENCOUNTER — Encounter: Payer: Self-pay | Admitting: Internal Medicine

## 2018-04-25 ENCOUNTER — Other Ambulatory Visit: Payer: Self-pay

## 2018-04-25 ENCOUNTER — Ambulatory Visit (INDEPENDENT_AMBULATORY_CARE_PROVIDER_SITE_OTHER): Payer: Self-pay | Admitting: Internal Medicine

## 2018-04-25 VITALS — BP 94/74 | HR 94 | Temp 98.1°F | Ht 65.0 in | Wt 165.5 lb

## 2018-04-25 DIAGNOSIS — I3139 Other pericardial effusion (noninflammatory): Secondary | ICD-10-CM

## 2018-04-25 DIAGNOSIS — Z7952 Long term (current) use of systemic steroids: Secondary | ICD-10-CM

## 2018-04-25 DIAGNOSIS — M3219 Other organ or system involvement in systemic lupus erythematosus: Secondary | ICD-10-CM

## 2018-04-25 DIAGNOSIS — M3212 Pericarditis in systemic lupus erythematosus: Secondary | ICD-10-CM

## 2018-04-25 DIAGNOSIS — Z79891 Long term (current) use of opiate analgesic: Secondary | ICD-10-CM

## 2018-04-25 DIAGNOSIS — M329 Systemic lupus erythematosus, unspecified: Secondary | ICD-10-CM

## 2018-04-25 DIAGNOSIS — Z79899 Other long term (current) drug therapy: Secondary | ICD-10-CM

## 2018-04-25 DIAGNOSIS — I313 Pericardial effusion (noninflammatory): Secondary | ICD-10-CM

## 2018-04-25 DIAGNOSIS — M328 Other forms of systemic lupus erythematosus: Secondary | ICD-10-CM

## 2018-04-25 MED ORDER — OXYCODONE-ACETAMINOPHEN 5-325 MG PO TABS: 1.0000 | ORAL_TABLET | Freq: Two times a day (BID) | ORAL | 0 refills | Status: DC | PRN

## 2018-04-25 MED ORDER — HYDROXYCHLOROQUINE SULFATE 200 MG PO TABS: 200.0000 mg | ORAL_TABLET | Freq: Every day | ORAL | 0 refills | Status: DC

## 2018-04-25 NOTE — Progress Notes (Signed)
Called [facility] to cancel Percocet rx per Dr Delma Officerhundi.

## 2018-04-25 NOTE — Patient Instructions (Signed)
It was a pleasure to see you today Ms. Hasan. Please make the following changes:  -Please continue using plaquenil  -Follow up with your rheumatologist -Please go to eye doctor appointment  -Please use percocet on an as needed basis for pain  If you have any questions or concerns, please call our clinic at [phone] between 9am-5pm and after hours call [phone] and ask for the internal medicine resident on call. If you feel you are having a medical emergency please call 911.   Thank you, we look forward to help you remain healthy!  Lorenso CourierVahini Armend Hochstatter, MD Internal Medicine PGY1

## 2018-04-25 NOTE — Assessment & Plan Note (Signed)
Patient was wearing her March 2019 admission.  She was initially started on colchicine.  She is subsequently placed on prednisone during flares.  Patient was most recently seen 1 week ago which time he was told to increase her dose of prednisone to 20 mg daily and was given 5-325 mg every 12 hours as needed.  States that over the past few days she has been having increased activity at work and also stressors at home which is made her lupus flare more.  She has not found much relief from using Tylenol or Advil and states that the Percocet helped somewhat.  The patient is on hydroxychloroquine alternating 400 mg and 200 mg every other day.  Scheduled for follow-up with [facility] in September 2019.    Assessment and plan Since the patient has required high-dose glucocorticoids and continued pain medication it is likely that she will need an immunomodulatory occasions such as methotrexate.  Recommended the patient follow-up with her rheumatologist to discuss options.   Reminded patient that she needs to be seen by ophthalmologist for eye evaluation since she is on Plaquenil.  Recommended patient continue prednisone 20 mg daily  Follow-up in 3 weeks Prescribed Percocet 5-3 25 every 12 hours as needed #10

## 2018-04-28 NOTE — Progress Notes (Signed)
Internal Medicine Clinic Attending  Case discussed with Dr. Chundi at the time of the visit.  We reviewed the resident's history and exam and pertinent patient test results.  I agree with the assessment, diagnosis, and plan of care documented in the resident's note. 

## 2018-04-30 MED FILL — DICLOFENAC SOD EC 50 MG TAB: 50 | 30 days supply | Qty: 90 | Fill #1

## 2018-05-09 ENCOUNTER — Telehealth: Payer: Self-pay

## 2018-05-09 MED ORDER — PREDNISONE 10 MG PO TABS: 10.0000 mg | ORAL_TABLET | Freq: Every day | ORAL | 0 refills | Status: DC

## 2018-05-09 NOTE — Telephone Encounter (Signed)
Sent refill today. Thanks!

## 2018-05-09 NOTE — Addendum Note (Signed)
Addended by: Lorenso CourierHUNDI, Crystal Scarberry on: 05/09/2018 11:18 AM   Modules accepted: Orders

## 2018-05-09 NOTE — Telephone Encounter (Signed)
Requesting prednisone to be filled @ [facility] [address].

## 2018-05-19 ENCOUNTER — Ambulatory Visit (INDEPENDENT_AMBULATORY_CARE_PROVIDER_SITE_OTHER): Payer: Self-pay | Admitting: Internal Medicine

## 2018-05-19 VITALS — BP 122/76 | HR 100 | Temp 98.9°F | Wt 170.2 lb

## 2018-05-19 DIAGNOSIS — I313 Pericardial effusion (noninflammatory): Secondary | ICD-10-CM

## 2018-05-19 DIAGNOSIS — M3212 Pericarditis in systemic lupus erythematosus: Secondary | ICD-10-CM

## 2018-05-19 DIAGNOSIS — M329 Systemic lupus erythematosus, unspecified: Secondary | ICD-10-CM

## 2018-05-19 DIAGNOSIS — M328 Other forms of systemic lupus erythematosus: Secondary | ICD-10-CM

## 2018-05-19 DIAGNOSIS — Z79891 Long term (current) use of opiate analgesic: Secondary | ICD-10-CM

## 2018-05-19 DIAGNOSIS — Z79899 Other long term (current) drug therapy: Secondary | ICD-10-CM

## 2018-05-19 DIAGNOSIS — I301 Infective pericarditis: Secondary | ICD-10-CM

## 2018-05-19 DIAGNOSIS — Z7952 Long term (current) use of systemic steroids: Secondary | ICD-10-CM

## 2018-05-19 DIAGNOSIS — M3219 Other organ or system involvement in systemic lupus erythematosus: Secondary | ICD-10-CM

## 2018-05-19 MED ORDER — HYDROXYCHLOROQUINE SULFATE 200 MG PO TABS: 200.0000 mg | ORAL_TABLET | Freq: Every day | ORAL | 0 refills | Status: DC

## 2018-05-19 MED ORDER — IBUPROFEN 200 MG PO TABS: 200.0000 mg | ORAL_TABLET | Freq: Four times a day (QID) | ORAL | 0 refills | Status: DC | PRN

## 2018-05-19 MED ORDER — OXYCODONE-ACETAMINOPHEN 5-325 MG PO TABS: 1.0000 | ORAL_TABLET | Freq: Two times a day (BID) | ORAL | 0 refills | Status: DC | PRN

## 2018-05-19 MED ORDER — PREDNISONE 10 MG PO TABS: 20.0000 mg | ORAL_TABLET | Freq: Every day | ORAL | 0 refills | Status: DC

## 2018-05-19 MED ORDER — IBUPROFEN 600 MG PO TABS: 600.0000 mg | ORAL_TABLET | Freq: Four times a day (QID) | ORAL | 0 refills | Status: AC | PRN

## 2018-05-19 NOTE — Patient Instructions (Addendum)
It was a pleasure to see you today Ms. Corvino. Please make the following changes:  -Please continue taking Plaquenil alternating 400 mg and 200 mg every other day.  Please also take prednisone 20 mg daily -Please use Flexeril on an as-needed basis when he had muscle cramps -Please follow-up with rheumatology in September  If you have any questions or concerns, please call our clinic at [phone] between 9am-5pm and after hours call [phone] and ask for the internal medicine resident on call. If you feel you are having a medical emergency please call 911.   Thank you, we look forward to help you remain healthy!  Lorenso CourierVahini Uzma Hellmer, MD Internal Medicine PGY2

## 2018-05-19 NOTE — Assessment & Plan Note (Addendum)
The patient is continuing to have pain in several joints (shoulders, knee, elbow, MCP, and PIP. She is currently on prednisone 20 mg daily,  hydroxychloroquine alternating 400 mg and 200 mg every other day.  She was also given Percocet 5-325 every 12 hours prn #10. She is scheduled to [facility] in [facility] 2019.   Assessment and plan The patient remains well controlled on current dose of prednisone and plaquenil. She was able to spread her 10 tablets of percocets over the past 3 weeks. She may benefit from long-term therapy with biologics. Her renal function remains good with cr=0.82 and gfr>60 per bmp in July 2019. She was advised to use birth control if she is sexually active. Use flexeril and ibuprofen as needed for pain. She was also reminded to follow up with her rheumatologist.    -Refilled patient's prednisone, plaquenil, and ibuprofen.

## 2018-05-19 NOTE — Progress Notes (Signed)
   CC: SLE follow up   HPI:  [name] is a 37 y.o. with SLE, pericardial effusion, viral pericarditis who presents for SLE follow-up. Please see problem based charting for evaluation, assessment, and plan.  Past Medical History:  Diagnosis Date  . Bronchitis   . Lupus (HCC)   . Pleuritis 12/31/2017   Review of Systems:   Has bilateral shoulder, elbow, knee, mcp, and pip joing pain Has fatigue   Physical Exam:  Vitals:   05/19/18 1009  BP: 122/76  Pulse: 100  Temp: 98.9 F (37.2 C)  TempSrc: Oral  SpO2: 100%  Weight: 170 lb 3.2 oz (77.2 kg)   Physical Exam  Constitutional: She appears well-developed and well-nourished. No distress.  HENT:  Head: Normocephalic and atraumatic.  Eyes: Conjunctivae are normal.  Cardiovascular: Normal rate, regular rhythm and normal heart sounds.  Respiratory: Effort normal and breath sounds normal. No respiratory distress. She has no wheezes.  GI: Soft. Bowel sounds are normal. She exhibits no distension. There is no tenderness.  Musculoskeletal: She exhibits tenderness (bilateral shoulders, knees, mcp, and pip tenderness to palpation). She exhibits no edema.  Neurological: She is alert.  Skin: She is not diaphoretic. No erythema.  Psychiatric: She has a normal mood and affect. Her behavior is normal. Judgment and thought content normal.     Assessment & Plan:   See Encounters Tab for problem based charting.  SLE The patient is continuing to have pain in several joints (shoulders, knee, elbow, MCP, and PIP. She is currently on prednisone 20 mg daily,  hydroxychloroquine alternating 400 mg and 200 mg every other day.  She was also given Percocet 5-325 every 12 hours prn #10. [facility].   Assessment and plan The patient remains well controlled on current dose of prednisone and plaquenil. She was able to spread her 10 tablets of percocets over the past 3 weeks. She may  benefit from long-term therapy with biologics. Her renal function remains good with cr=0.82 and gfr>60 per bmp in July 2019. She was advised to use birth control if she is sexually active. Use flexeril and ibuprofen as needed for pain. She was also reminded to follow up with her rheumatologist.    -Refilled patient's prednisone, plaquenil, and ibuprofen. -Refilled percocet 5-325 q12hrs prn for pain  Patient discussed with Dr. Rogelia BogaButcher

## 2018-05-20 NOTE — Progress Notes (Signed)
Internal Medicine Clinic Attending  Case discussed with Dr. Chundi at the time of the visit.  We reviewed the resident's history and exam and pertinent patient test results.  I agree with the assessment, diagnosis, and plan of care documented in the resident's note. 

## 2018-06-25 ENCOUNTER — Other Ambulatory Visit: Payer: Self-pay | Admitting: Internal Medicine

## 2018-06-25 ENCOUNTER — Encounter: Payer: Self-pay | Admitting: Internal Medicine

## 2018-06-25 ENCOUNTER — Ambulatory Visit (INDEPENDENT_AMBULATORY_CARE_PROVIDER_SITE_OTHER): Payer: Self-pay | Admitting: Internal Medicine

## 2018-06-25 ENCOUNTER — Other Ambulatory Visit: Payer: Self-pay

## 2018-06-25 DIAGNOSIS — M329 Systemic lupus erythematosus, unspecified: Secondary | ICD-10-CM

## 2018-06-25 DIAGNOSIS — M3219 Other organ or system involvement in systemic lupus erythematosus: Secondary | ICD-10-CM

## 2018-06-25 DIAGNOSIS — Z79899 Other long term (current) drug therapy: Secondary | ICD-10-CM

## 2018-06-25 DIAGNOSIS — M797 Fibromyalgia: Secondary | ICD-10-CM | POA: Insufficient documentation

## 2018-06-25 MED ORDER — OXYCODONE-ACETAMINOPHEN 5-325 MG PO TABS: 1.0000 | ORAL_TABLET | Freq: Every day | ORAL | 0 refills | Status: DC | PRN

## 2018-06-25 NOTE — Patient Instructions (Signed)
It was our pleasure taking care of you in our clinic today. You were seen due to generalized body pain, without fever, congestion or flu like symptoms. Your physical exam is suggestive that your generalized pain is due to fibromyalgia. Your blood test and urine test that performed before is normal. Your rheumatologist recently prescribed northryptilin for you which will help for controlling your pain but may takes some time.  As we talked, I prescribe you some pain medication (Percocet) to take daily as needed until nortriptyline works. . Please be aware that it may makes you drowsy so avoid driving or other activity that needs attention. Please follow up with your PCP, Dr Delma Officer next month for long term plan. I also give you written information about fibromyalgia.  Please contact us if you have any question or concern and come back to clinic as needed if your symptoms is not improved by 1-2 weeks.  Thanks, Dr. Teola Bradley

## 2018-06-25 NOTE — Progress Notes (Signed)
   CC: body pain HPI:  Ms.Sabrena Lingo is a 37 y.o. female with past medical history as listed below, came into the clinic with one-week history of generalized body pain.  Please see assessment and plan for this encounter for further details.  Past Medical History:  Diagnosis Date  . Bronchitis   . Lupus (HCC)   . Pleuritis 12/31/2017   Family history: Heart disease in sister (drug-induced heart disease per patient) Cervical cancer in sister  Social history: Smokes 0.25 pack a day Drinks alcohol socially Smokes CBD  Review of Systems:  Review of Systems  Constitutional: Positive for malaise/fatigue. Negative for chills and fever.  Respiratory: Negative for cough and shortness of breath.   Gastrointestinal: Positive for abdominal pain. Negative for nausea and vomiting.  Musculoskeletal: Positive for joint pain and myalgias.  Skin: Negative for itching and rash.     Physical Exam:  Vitals:   06/25/18 0852  BP: 132/72  Pulse: 96  Temp: 98.1 F (36.7 C)  TempSrc: Oral  Weight: 172 lb 3.2 oz (78.1 kg)  Height: 5\' 1"  (1.549 m)   Physical Exam  Constitutional: She is oriented to person, place, and time. Is in pain Cardiovascular: Normal rate, regular rhythm and normal heart sounds.  No murmur heard. Pulmonary/Chest: Breath sounds normal. No respiratory distress. She has no rales.  Abdominal: Soft.  Musculoskeletal: She exhibits tenderness bilaterally at shoulders, chests, elbows forearms, knees and shins      Neurological: She is alert and oriented to person, place, and time.  Skin: Sensitive to touch generally. No rash noted.    Assessment & Plan:   See Encounters Tab for problem based charting.  Patient seen with Dr. Cleda Daub

## 2018-06-25 NOTE — Assessment & Plan Note (Signed)
Patient presents with generalized body aches today.  No rash, no fever and no other associated symptoms. Patient has been off of prednisone since 10 days ago. Was seen by rheumatologist last week and recommended to be off of prednisone. On exam: No rash. Has generalized tenderness and sensitivity to touch.  Lab result: CBC, UA, dsDNA antibody checked by rheumatologist recently. CBC and UA were normal.  UDS DNA antibody was 167.  Per rheumatologist, will continue plaquenil.  Her body ache is mostly due to fibromyalgia and SLE flare up.  -Continue Plaquenil, alternating 200 mg and 400 mg every other day -Follow-up with rheumatologist and PCP

## 2018-06-30 NOTE — Progress Notes (Signed)
Internal Medicine Clinic Attending  I saw and evaluated the patient.  I personally confirmed the key portions of the history and exam documented by Dr. Maryla Morrow and I reviewed pertinent patient test results.  The assessment, diagnosis, and plan were formulated together and I agree with the documentation in the resident's note. Discussed with Dr Delma Officer as well, will provided a limited Rx for oxycodone-acetaminophen for breakthrough pain, patient has appointment next month with Dr Delma Officer where pain will be reassessed and medication contract signed if needed.

## 2018-07-23 NOTE — Progress Notes (Addendum)
   CC: SLE follow-up  HPI:  [name] is a 37 y.o. with SLE and fibromyalgia who presents for SLE follow up. Please see problem based charting for evaluation, assessment, and plan.  Past Medical History:  Diagnosis Date  . Bronchitis   . Lupus (HCC)   . Pleuritis 12/31/2017   Review of Systems:    Has bilateral shoulder, posterior neck, bilateral elbows, bilateral knees, skin dryness Denies fever, mouth sores, rashes, dysuria,vomiting, chest pain, sob  Physical Exam:  Vitals:   07/25/18 1321  BP: 117/62  Pulse: 84  Temp: 98.1 F (36.7 C)  TempSrc: Oral  SpO2: 100%  Weight: 168 lb 6.4 oz (76.4 kg)   Physical Exam  Constitutional: She appears well-developed and well-nourished. No distress.  HENT:  Head: Normocephalic and atraumatic.  Eyes: Conjunctivae are normal.  Cardiovascular: Normal rate, regular rhythm and normal heart sounds.  Tenderness to palpation on anterior chest wall in the midclavicular region at approximately fifth intercostal region  Respiratory: Effort normal and breath sounds normal. No respiratory distress. She has no wheezes.  GI: Soft. Bowel sounds are normal. She exhibits no distension. There is no tenderness.  Musculoskeletal:       Left knee: She exhibits swelling (mild). She exhibits no effusion and no erythema.  Tenderness to palpation in bilateral shoulders, bilateral knees, posterior neck  Skin: She is not diaphoretic. No erythema.  Psychiatric: She has a normal mood and affect. Her behavior is normal. Judgment and thought content normal.   Assessment & Plan:   See Encounters Tab for problem based charting.  Patient discussed with Dr. Cyndie Chime   Medicine attending: Medical history, presenting problems, physical findings, and medications, reviewed with resident physician Dr Lorenso Courier on the day of the patient visit and I concur with her evaluation and management plan.

## 2018-07-25 ENCOUNTER — Other Ambulatory Visit: Payer: Self-pay

## 2018-07-25 ENCOUNTER — Encounter: Payer: Self-pay | Admitting: Internal Medicine

## 2018-07-25 ENCOUNTER — Ambulatory Visit (INDEPENDENT_AMBULATORY_CARE_PROVIDER_SITE_OTHER): Payer: Self-pay | Admitting: Internal Medicine

## 2018-07-25 VITALS — BP 117/62 | HR 84 | Temp 98.1°F | Wt 168.4 lb

## 2018-07-25 DIAGNOSIS — M3219 Other organ or system involvement in systemic lupus erythematosus: Secondary | ICD-10-CM

## 2018-07-25 DIAGNOSIS — Z79899 Other long term (current) drug therapy: Secondary | ICD-10-CM

## 2018-07-25 DIAGNOSIS — Z23 Encounter for immunization: Secondary | ICD-10-CM

## 2018-07-25 DIAGNOSIS — M329 Systemic lupus erythematosus, unspecified: Secondary | ICD-10-CM

## 2018-07-25 DIAGNOSIS — Z0000 Encounter for general adult medical examination without abnormal findings: Secondary | ICD-10-CM | POA: Insufficient documentation

## 2018-07-25 DIAGNOSIS — M797 Fibromyalgia: Secondary | ICD-10-CM

## 2018-07-25 DIAGNOSIS — L853 Xerosis cutis: Secondary | ICD-10-CM

## 2018-07-25 NOTE — Assessment & Plan Note (Signed)
TDAP and influenza vaccine were administered during this visit

## 2018-07-25 NOTE — Assessment & Plan Note (Signed)
The patient continues to have pain in several joints.  She was recently started on nortriptyline 10 mg nightly by her rheumatologist.  She states that the nortriptyline has had some benefit in helping her fall asleep, but she feels like she can have more benefit from using this during the daytime.  Assessment and plan Patient should continue Lifestyle changes of daily exercises- Tai chi, water aerobics.  She should also continue using nortriptyline 10 mg.  Nortriptyline can be changed from nighttime dose to morning.

## 2018-07-25 NOTE — Assessment & Plan Note (Addendum)
The patient states that she is continuing to have joint pain in neck, bilateral shoulder, and bilateral knees. She is having difficulty raising her arms above shoulder and combing her hair. 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 She cannot place hands above shoulder, comb hair.Her morning stiffness lasts for 2-3hrs. She does not have mucositis or serositis.  Patient states that for the past 3 to 4 weeks she has noticed chest wall tenderness minutes.  She feels that the chest pain might be pleuritic and positional.  She does not have any accompanying shortness of breath or extreme pain.  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  Patient is currently taking plaquenil alternating 400mg  and 200mg  every other day. She was seen by rheumatologist on 06/19/2018.  Labs done during that visit showed positive ANA-which is nonspecific, dsdna (167), smith, ssA, chromatin ab, rnp.  She was negative for Scl-70, ssb, jo-1, centromere. C3=138, C4=38.  Assessment and plan The patient's sle has improved significantly from prior. She continues to have joint pain, mild amount of left knee swelling, fatigue and myalgias but a component of this may also be from fibromyalgia. She does not  have any mucosal ulcers, psychosis, or rash. Lung and heart sounds are normal on auscultation.  Low likelihood of patient having pericarditis or pleuritis.  The patient's dsDNA which is a marker disease progression SLE is elevated at 167.  Elevation in dsDNA can also be representative of possible progression to lupus nephritis.  Patient's creatinine remains at baseline 0.89 and UA does not show any presence of protein, dysmorphic RBCs, casts per labs in September 2019.  Moreover, protein/creatinine ratio = 53, urine protein 10 which is normal.  -Continue  hydroxychloroquine alternating 400/200 mg every other day.  Should help with disease progression (from arthralgias, serositis), development of nephritis and CNS involvement. -Patient does not have significant SLE organ involvement and therefore she should not necessitate long-term prednisone -Avoid medications that can further worsen/dose SLE (hydralazine, sulfasalazine, procanamide, isoniazid, minocycline).

## 2018-07-25 NOTE — Patient Instructions (Addendum)
It was a pleasure to see you today [name]. Please make the following changes:  -Please continue taking Plaquenil 400/200 mg alternating every day -If you notice that your chest pain worsens please seek medical attention immediately -Please start taking nortriptyline 10 mg in the morning rather than at nighttime  -Continue water aerobics, yoga, and consider massages  If you have any questions or concerns, please call our clinic at [phone] between 9am-5pm and after hours call [phone] and ask for the internal medicine resident on call. If you feel you are having a medical emergency please call 911.   Thank you, we look forward to help you remain healthy!  Lorenso Courier, MD Internal Medicine PGY2

## 2018-07-30 ENCOUNTER — Ambulatory Visit: Payer: Medicaid Other

## 2018-09-19 ENCOUNTER — Other Ambulatory Visit: Payer: Self-pay

## 2018-09-19 ENCOUNTER — Encounter: Payer: Self-pay | Admitting: Internal Medicine

## 2018-09-19 ENCOUNTER — Ambulatory Visit (INDEPENDENT_AMBULATORY_CARE_PROVIDER_SITE_OTHER): Payer: Self-pay | Admitting: Internal Medicine

## 2018-09-19 VITALS — BP 120/82 | HR 73 | Temp 98.2°F | Ht 61.0 in | Wt 166.2 lb

## 2018-09-19 DIAGNOSIS — M797 Fibromyalgia: Secondary | ICD-10-CM

## 2018-09-19 LAB — CK: Total CK: 146 U/L (ref 38–234)

## 2018-09-19 MED ORDER — GABAPENTIN 300 MG PO CAPS: 900.0000 mg | ORAL_CAPSULE | Freq: Every day | ORAL | 1 refills | Status: DC

## 2018-09-19 NOTE — Assessment & Plan Note (Addendum)
HPI: Patient presents with acute onset shoulder and hip girdle pain. She states that yesterday morning she began to feel stiff and have severe pain with any type of movement. This progressed until this morning when she began to have a locking sensation in her hips and shoulders. She tried to take her cyclobenzaprine which she states made the pain worse. She tells me that she does feel this pain is different than her lupus pain and feels like it is more related to her fibromyalgia. She has been on oxycodone in the past which she states relieved her pain. In addition to oxycodone she has tried amitriptyline and duloxetine in the past. Neither of these medications provided relief. She denies fevers, chills, rhinorrhea, cough, new rash, hematuria, chest pain, shortness of breath, oral or genital ulcers.  On physical exam she is hemodynamically stable but very upset. She has severe tenderness to palpation of the major muscle bodies in the shoulder and hips. Neurological exam shows 4/5 strength in the upper and lower extremities. I do not believe that she has true weakness I think that her exam is limited by pain. Her reflexes, including her patellar and brachioradial are 2+ bilaterally. Oropharynx is clear without any ulcers. No rashes noted. No friction rub noted on cardiac auscultation. Pulmonary exam is unremarkable.  Assessment and plan: Based on the patient's symptoms she is likely experiencing a fibromyalgia flare. She has failed therapy with amitriptyline and duloxetine in the past. We will start gabapentin 900 mg at night. We discussed this medication can be escalated if it does not control her pain. We discussed that physical activity is important for the management of fibromyalgia. She will continue her cyclobenzaprine, which has been shown to be useful in fibromyalgia. Although this is likely a fibromyalgia flare, we will check CK to ensure we're not missing a myositis. She will follow-up with her PCP in  one month for continued management of her fibromyalgia.

## 2018-09-19 NOTE — Progress Notes (Signed)
Internal Medicine Clinic Attending  Case discussed with Dr. Helberg at the time of the visit.  We reviewed the resident's history and exam and pertinent patient test results.  I agree with the assessment, diagnosis, and plan of care documented in the resident's note.    

## 2018-09-19 NOTE — Progress Notes (Signed)
   CC: Diffuse pain  HPI:  Diana Huynh is a 37 y.o. female with PMHx listed below presenting for diffuse body aches. Please see the A&P for the status of the patient's chronic medical problems.  Past Medical History:  Diagnosis Date  . Bronchitis   . Lupus (HCC)   . Pleuritis 12/31/2017   Review of Systems:  Performed and all others negative.  Physical Exam: Vitals:   09/19/18 1423  BP: 120/82  Pulse: 73  Temp: 98.2 F (36.8 C)  TempSrc: Oral  SpO2: 100%  Weight: 166 lb 3.2 oz (75.4 kg)  Height: 5\' 1"  (1.549 m)   General: Well nourished female, very upset  HENT: no oral ulcers Pulm: Good air movement with no wheezing or crackles  CV: RRR, no murmurs, no rubs  Abdomen: Active bowel sounds, soft, non-distended, no tenderness to palpation  Extremities: Pulses palpable in all extremities, no LE edema  Skin: no rashes Neuro: Alert and oriented x 3, gross strength 4/5 in the upper and lower extremities. Exam limited by pain. Patellar reflex 2+ bilaterally. Brachial radial reflex 2+ bilaterally.  Assessment & Plan:   See Encounters Tab for problem based charting.  Patient discussed with Dr. Oswaldo DoneVincent

## 2018-09-19 NOTE — Patient Instructions (Signed)
Thank you for allowing us to provide your care. I think you are likely experiencing a flare in your fibromyalgia. I'm starting you on a medication called gabapentin. You will take 900 mg right before bed. This medication can make you groggy so please do not operate vehicles or heavy machinery until you know how this will affect you. We can increase this medication if you feel that it is not providing much relief. Please give me a call to discuss this.  We are gonna check some blood work today, and I will call you if anything is abnormal.  Please come back in one month to follow up with her primary care provider or sooner if any issues arise.

## 2018-09-27 ENCOUNTER — Other Ambulatory Visit: Payer: Self-pay

## 2018-09-27 ENCOUNTER — Emergency Department (HOSPITAL_COMMUNITY): Payer: Medicaid Other

## 2018-09-27 ENCOUNTER — Encounter (HOSPITAL_COMMUNITY): Payer: Self-pay | Admitting: Emergency Medicine

## 2018-09-27 ENCOUNTER — Emergency Department (HOSPITAL_COMMUNITY)
Admission: EM | Admit: 2018-09-27 | Discharge: 2018-09-27 | Disposition: A | Discharge status: Home / Self Care | Payer: Medicaid Other | Attending: Emergency Medicine | Admitting: Emergency Medicine

## 2018-09-27 DIAGNOSIS — R05 Cough: Secondary | ICD-10-CM

## 2018-09-27 DIAGNOSIS — Z79899 Other long term (current) drug therapy: Secondary | ICD-10-CM | POA: Insufficient documentation

## 2018-09-27 DIAGNOSIS — R059 Cough, unspecified: Secondary | ICD-10-CM

## 2018-09-27 DIAGNOSIS — R0789 Other chest pain: Secondary | ICD-10-CM | POA: Insufficient documentation

## 2018-09-27 DIAGNOSIS — R110 Nausea: Secondary | ICD-10-CM

## 2018-09-27 DIAGNOSIS — Z720 Tobacco use: Secondary | ICD-10-CM

## 2018-09-27 DIAGNOSIS — F17210 Nicotine dependence, cigarettes, uncomplicated: Secondary | ICD-10-CM | POA: Insufficient documentation

## 2018-09-27 DIAGNOSIS — M7918 Myalgia, other site: Secondary | ICD-10-CM

## 2018-09-27 HISTORY — DX: Fibromyalgia: M79.7

## 2018-09-27 LAB — CBC WITH DIFFERENTIAL/PLATELET
Abs Immature Granulocytes: 0.01 10*3/uL (ref 0.00–0.07)
BASOS PCT: 1 %
Basophils Absolute: 0 10*3/uL (ref 0.0–0.1)
Eosinophils Absolute: 0.1 10*3/uL (ref 0.0–0.5)
Eosinophils Relative: 2 %
HCT: 37.6 % (ref 36.0–46.0)
Hemoglobin: 12 g/dL (ref 12.0–15.0)
Immature Granulocytes: 0 %
Lymphocytes Relative: 30 %
Lymphs Abs: 1.9 10*3/uL (ref 0.7–4.0)
MCH: 27.1 pg (ref 26.0–34.0)
MCHC: 31.9 g/dL (ref 30.0–36.0)
MCV: 84.9 fL (ref 80.0–100.0)
Monocytes Absolute: 0.6 10*3/uL (ref 0.1–1.0)
Monocytes Relative: 10 %
NRBC: 0 % (ref 0.0–0.2)
Neutro Abs: 3.7 10*3/uL (ref 1.7–7.7)
Neutrophils Relative %: 57 %
PLATELETS: 237 10*3/uL (ref 150–400)
RBC: 4.43 MIL/uL (ref 3.87–5.11)
RDW: 14.7 % (ref 11.5–15.5)
WBC: 6.5 10*3/uL (ref 4.0–10.5)

## 2018-09-27 LAB — COMPREHENSIVE METABOLIC PANEL
ALBUMIN: 3.9 g/dL (ref 3.5–5.0)
ALT: 37 U/L (ref 0–44)
AST: 29 U/L (ref 15–41)
Alkaline Phosphatase: 44 U/L (ref 38–126)
Anion gap: 8 (ref 5–15)
BUN: 9 mg/dL (ref 6–20)
CO2: 24 mmol/L (ref 22–32)
Calcium: 9.1 mg/dL (ref 8.9–10.3)
Chloride: 104 mmol/L (ref 98–111)
Creatinine, Ser: 0.84 mg/dL (ref 0.44–1.00)
GFR calc Af Amer: >60 mL/min (ref >60)
GFR calc non Af Amer: >60 mL/min (ref >60)
GLUCOSE: 104 mg/dL — AB (ref 70–99)
Potassium: 3.5 mmol/L (ref 3.5–5.1)
Sodium: 136 mmol/L (ref 135–145)
Total Bilirubin: 0.2 mg/dL — ABNORMAL LOW (ref 0.3–1.2)
Total Protein: 7.7 g/dL (ref 6.5–8.1)

## 2018-09-27 LAB — POC URINE PREG, ED: Preg Test, Ur: NEGATIVE

## 2018-09-27 LAB — I-STAT TROPONIN, ED: Troponin i, poc: 0 ng/mL (ref 0.00–0.08)

## 2018-09-27 LAB — D-DIMER, QUANTITATIVE: D-Dimer, Quant: 0.75 ug/mL-FEU — ABNORMAL HIGH (ref 0.00–0.50)

## 2018-09-27 MED ORDER — ONDANSETRON HCL 4 MG/2ML IJ SOLN
4.0000 mg | Freq: Once | INTRAMUSCULAR | Status: AC
  Administered 2018-09-27: 4 mg via INTRAVENOUS
  Filled 2018-09-27: qty 2

## 2018-09-27 MED ORDER — IOPAMIDOL (ISOVUE-370) INJECTION 76%
75.0000 mL | Freq: Once | INTRAVENOUS | Status: AC | PRN
  Administered 2018-09-27: 75 mL via INTRAVENOUS

## 2018-09-27 MED ORDER — MORPHINE SULFATE (PF) 4 MG/ML IV SOLN
4.0000 mg | Freq: Once | INTRAVENOUS | Status: AC
  Administered 2018-09-27: 4 mg via INTRAVENOUS
  Filled 2018-09-27: qty 1

## 2018-09-27 MED ORDER — ONDANSETRON 4 MG PO TBDP: 4.0000 mg | ORAL_TABLET | Freq: Three times a day (TID) | ORAL | 0 refills | Status: DC | PRN

## 2018-09-27 MED ORDER — SODIUM CHLORIDE 0.9 % IV BOLUS
1000.0000 mL | Freq: Once | INTRAVENOUS | Status: AC
  Administered 2018-09-27: 1000 mL via INTRAVENOUS

## 2018-09-27 MED ORDER — IOPAMIDOL (ISOVUE-370) INJECTION 76%
INTRAVENOUS | Status: AC
  Filled 2018-09-27: qty 100

## 2018-09-27 NOTE — ED Triage Notes (Signed)
Pt reports chest, throat and neck pain that began yesterday but reports generalized body pain off and on for awhile. Hx of lupus and fibromyalgia. Endorses SOB when laying down and pain with deep breaths.

## 2018-09-27 NOTE — Discharge Instructions (Signed)
Your labs, CT, and EKG are all reassuring, your chest pain could be a variety of things including muscle pain, gas pain, indigestion, and other nonemergent issues. Alternate between tylenol and motrin as directed as needed for pain, always taking these on a full stomach and NEVER on an empty stomach. Stay well hydrated. You may consider using heat to the areas of pain, no more than 20 minutes every hour. If you think indigestion could be contributing to your symptoms, you may use over the counter tums, maalox, pepto bismol, zantac, etc to help with symptoms; avoid spicy/fatty/fried/acidic foods. STOP SMOKING! Use zofran as directed as needed for nausea. Use over the counter cough medications like mucinex or robitussin to help with cough. Follow up with your regular doctor in 1 week for recheck of symptoms. Return to the ER for changes or worsening symptoms.  SEEK IMMEDIATE MEDICAL ATTENTION IF: You develop a fever.  Your chest pains become severe or intolerable.  You develop new, unexplained symptoms (problems).  You develop shortness of breath, nausea, vomiting, sweating or feel light headed.  You develop a new cough or you cough up blood. You develop new leg swelling

## 2018-09-27 NOTE — ED Provider Notes (Signed)
[facility] EMERGENCY DEPARTMENT Provider Note   CSN: [id number] Arrival date & time: 09/27/18  1147     History   Chief Complaint Chief Complaint  Patient presents with  . Chest Pain  . Generalized Body Aches    HPI Diana Huynh is a 37 y.o. female with a PMHx of lupus, pleurisy, and fibromyalgia, who presents to the ED with complaints of generalized body aches off and on for several weeks, with 1 day of right-sided chest pain.  She reports that she has been having body aches for a while, but yesterday she started having 7/10 intermittent sharp right-sided chest pain that radiates into her throat/neck and upper back, worse with laying down and breathing, and with no treatments specifically tried prior to arrival.  She reports associated chills, nausea, and a productive cough for the last 2 days.  She endorses being a cigarette smoker.  She has no family history of cardiac disease.  She denies any fevers, diaphoresis, lightheadedness, sore throat, shortness of breath, leg swelling, recent travel/surgery/immobilization, estrogen use, personal or family history of DVT/PE, abdominal pain, vomiting, diarrhea, constipation, dysuria, hematuria, numbness, tingling, focal weakness, or any other complaints at this time.  The history is provided by the patient and medical records. No language interpreter was used.  Chest Pain   Associated symptoms include cough and nausea. Pertinent negatives include no abdominal pain, no diaphoresis, no fever, no numbness, no shortness of breath, no vomiting and no weakness.    Past Medical History:  Diagnosis Date  . Bronchitis   . Fibromyalgia   . Lupus (HCC)   . Pleuritis 12/31/2017    Patient Active Problem List   Diagnosis Date Noted  . Healthcare maintenance 07/25/2018  . Fibromyalgia 06/25/2018  . Acute non-recurrent frontal sinusitis 02/10/2018  . Viral pericarditis 01/06/2018  . SLE (systemic lupus erythematosus) (HCC)  12/31/2017  . Pericardial effusion     Past Surgical History:  Procedure Laterality Date  . ANKLE SURGERY    . APPENDECTOMY    . CESAREAN SECTION       OB History   No obstetric history on file.      Home Medications    Prior to Admission medications   Medication Sig Start Date End Date Taking? Authorizing Provider  alum & mag hydroxide-simeth (MAALOX REGULAR STRENGTH) 200-200-20 MG/5ML suspension Take 30 mLs by mouth every 6 (six) hours as needed for indigestion or heartburn. 06/30/17   CardamaAmadeo Garnet, MD  cyclobenzaprine (FLEXERIL) 5 MG tablet Take 1 tablet (5 mg total) by mouth 3 (three) times daily as needed for muscle spasms. 04/17/18   Theotis Barrio, MD  famotidine (PEPCID) 20 MG tablet Take 1 tablet (20 mg total) by mouth 2 (two) times daily. 01/29/18   Tereso Newcomer T, PA-C  gabapentin (NEURONTIN) 300 MG capsule Take 3 capsules (900 mg total) by mouth at bedtime. 09/19/18   Levora Dredge, MD  hydroxychloroquine (PLAQUENIL) 200 MG tablet Take 1 tablet (200 mg total) by mouth daily. 05/19/18   Chundi, Sherlyn Lees, MD  ondansetron (ZOFRAN ODT) 4 MG disintegrating tablet Take 1 tablet (4 mg total) by mouth every 6 (six) hours as needed. 01/05/18   Ward, Layla Maw, DO    Family History No family history on file.  Social History Social History   Tobacco Use  . Smoking status: Current Every Day Smoker    Packs/day: 0.25    Types: Cigarettes  . Smokeless tobacco: Never Used  . Tobacco comment: 5  cigs per day  Substance Use Topics  . Alcohol use: Yes    Comment: Rarely-holidays.  . Drug use: No     Allergies   Patient has no known allergies.   Review of Systems Review of Systems  Constitutional: Positive for chills. Negative for diaphoresis and fever.  HENT: Negative for sore throat.   Respiratory: Positive for cough. Negative for shortness of breath.   Cardiovascular: Positive for chest pain. Negative for leg swelling.  Gastrointestinal: Positive for nausea.  Negative for abdominal pain, constipation, diarrhea and vomiting.  Genitourinary: Negative for dysuria and hematuria.  Musculoskeletal: Positive for myalgias. Negative for arthralgias.  Skin: Negative for color change.  Allergic/Immunologic: Positive for immunocompromised state (on plaquenil).  Neurological: Negative for weakness, light-headedness and numbness.  Psychiatric/Behavioral: Negative for confusion.   All other systems reviewed and are negative for acute change except as noted in the HPI.    Physical Exam Updated Vital Signs BP (!) 141/75 (BP Location: Right Arm)   Pulse 96   Temp 98.6 F (37 C) (Oral)   Resp 18   Ht 5\' 1"  (1.549 m)   Wt 74.8 kg   SpO2 100%   BMI 31.18 kg/m   Physical Exam Vitals signs and nursing note reviewed.  Constitutional:      General: She is not in acute distress.    Appearance: Normal appearance. She is well-developed. She is not toxic-appearing.     Comments: Afebrile, nontoxic, NAD  HENT:     Head: Normocephalic and atraumatic.  Eyes:     General:        Right eye: No discharge.        Left eye: No discharge.     Conjunctiva/sclera: Conjunctivae normal.  Neck:     Musculoskeletal: Normal range of motion and neck supple.  Cardiovascular:     Rate and Rhythm: Normal rate and regular rhythm.     Pulses: Normal pulses.     Heart sounds: Normal heart sounds, S1 normal and S2 normal. No murmur. No friction rub. No gallop.      Comments: RRR, nl s1/s2, no m/r/g, distal pulses intact, no pedal edema  Pulmonary:     Effort: Pulmonary effort is normal. No respiratory distress.     Breath sounds: Normal breath sounds. No decreased breath sounds, wheezing, rhonchi or rales.     Comments: CTAB in all lung fields, no w/r/r, no hypoxia or increased WOB, speaking in full sentences, SpO2 100% on RA  Chest:     Chest wall: Tenderness present. No deformity or crepitus.       Comments: Chest wall with diffuse R sided TTP without crepitus,  deformities, or retractions  Abdominal:     General: Bowel sounds are normal. There is no distension.     Palpations: Abdomen is soft. Abdomen is not rigid.     Tenderness: There is no abdominal tenderness. There is no right CVA tenderness, left CVA tenderness, guarding or rebound. Negative signs include Murphy's sign and McBurney's sign.  Musculoskeletal: Normal range of motion.     Right lower leg: No edema.     Left lower leg: No edema.     Comments: MAE x4 Strength and sensation grossly intact in all extremities Distal pulses intact No pedal edema, neg homan's bilaterally  Pt c/o pain essentially anywhere she's touched.   Skin:    General: Skin is warm and dry.     Findings: No rash.  Neurological:     Mental Status:  She is alert and oriented to person, place, and time.     Sensory: Sensation is intact. No sensory deficit.     Motor: Motor function is intact.  Psychiatric:        Mood and Affect: Mood and affect normal.        Behavior: Behavior normal.      ED Treatments / Results  Labs (all labs ordered are listed, but only abnormal results are displayed) Labs Reviewed  COMPREHENSIVE METABOLIC PANEL - Abnormal; Notable for the following components:      Result Value   Glucose, Bld 104 (*)    Total Bilirubin 0.2 (*)    All other components within normal limits  D-DIMER, QUANTITATIVE (NOT AT [facility]) - Abnormal; Notable for the following components:   D-Dimer, Quant 0.75 (*)    All other components within normal limits  CBC WITH DIFFERENTIAL/PLATELET  I-STAT TROPONIN, ED  POC URINE PREG, ED    EKG None  ED ECG REPORT   Date: 09/27/2018  Rate: 97  Rhythm: normal sinus rhythm  QRS Axis: normal  Intervals: PR prolonged  ST/T Wave abnormalities: nonspecific T wave changes  Conduction Disutrbances:none  Narrative Interpretation:   Old EKG Reviewed: unchanged  I have personally reviewed the EKG tracing and agree with the computerized printout as  noted.   Radiology Ct Angio Chest Pe W And/or Wo Contrast  Result Date: 09/27/2018 CLINICAL DATA:  Chest, throat and neck pain. Shortness of breath. Positive D-dimer. EXAM: CT ANGIOGRAPHY CHEST WITH CONTRAST TECHNIQUE: Multidetector CT imaging of the chest was performed using the standard protocol during bolus administration of intravenous contrast. Multiplanar CT image reconstructions and MIPs were obtained to evaluate the vascular anatomy. CONTRAST:  75mL ISOVUE-370 IOPAMIDOL (ISOVUE-370) INJECTION 76% COMPARISON:  Chest x-ray dated 04/09/2018 and CT angiogram of the chest dated 12/27/2017 FINDINGS: Cardiovascular: Satisfactory opacification of the pulmonary arteries to the segmental level. No evidence of pulmonary embolism. Normal heart size. There is a small pericardial effusion, diminished since the prior exam. Overall heart size is normal. Mediastinum/Nodes: No enlarged mediastinal, hilar, or axillary lymph nodes. Thyroid gland, trachea, and esophagus demonstrate no significant findings. Slight haziness in the anterior mediastinum is unchanged since the prior study, nonspecific. Lungs/Pleura: No significant abnormality. No pleural effusions. Upper Abdomen: Normal. Musculoskeletal: No chest wall abnormality. No acute or significant osseous findings. Review of the MIP images confirms the above findings. IMPRESSION: 1. No pulmonary emboli or other acute abnormalities. 2. Small pericardial effusion, diminished since the prior study. Electronically Signed   By: Francene BoyersJames  Maxwell M.D.   On: 09/27/2018 14:47    Procedures Procedures (including critical care time)  Medications Ordered in ED Medications  morphine 4 MG/ML injection 4 mg (4 mg Intravenous Given 09/27/18 1311)  ondansetron (ZOFRAN) injection 4 mg (4 mg Intravenous Given 09/27/18 1310)  sodium chloride 0.9 % bolus 1,000 mL (0 mLs Intravenous Stopped 09/27/18 1429)  morphine 4 MG/ML injection 4 mg (4 mg Intravenous Given 09/27/18 1402)   iopamidol (ISOVUE-370) 76 % injection 75 mL (75 mLs Intravenous Contrast Given 09/27/18 1424)     Initial Impression / Assessment and Plan / ED Course  I have reviewed the triage vital signs and the nursing notes.  Pertinent labs & imaging results that were available during my care of the patient were reviewed by me and considered in my medical decision making (see chart for details).     37 y.o. female here with chest pain and generalized pain that began yesterday.  She states that her body aches have been off and on for quite some time, but her chest pain started yesterday.  Associated chills, nausea, and productive cough.  On exam, reproducible chest wall tenderness throughout the entire right side of her chest, no appreciable abdominal tenderness, she complains of tenderness essentially anywhere she is touched.  No tachycardia or hypoxia, clear lung exam, no pedal edema.  This is likely due to her fibromyalgia or her SLE, however given her pleuritic type chest pain, will get labs, EKG, chest x-ray, and d-dimer.  Will give pain and nausea medicine, fluids, and reassess shortly.  1:43 PM CBC w/diff WNL. CMP WNL. Trop neg. D-dimer elevated at 0.75 will proceed with CTA to r/o PE. EKG without acute ischemic findings. CXR cancelled since we're going with CTA now. Pt still in pain, will reorder morphine and reassess shortly.   4:01 PM CTA chest neg for PE, shows improving small pericardial effusion. Upreg neg. Pt feeling somewhat better. Overall symptoms likely related to musculoskeletal pain, probably from lupus vs fibromyalgia, and possibly viral URI/cough. Doubt need for further emergent work up or intervention at this time. Advised tylenol/motrin for pain, tobacco cessation strongly encouraged, will rx zofran, advised OTC remedies for symptomatic relief, and f/up with PCP in 1wk for recheck. I explained the diagnosis and have given explicit precautions to return to the ER including for any other  new or worsening symptoms. The patient understands and accepts the medical plan as it's been dictated and I have answered their questions. Discharge instructions concerning home care and prescriptions have been given. The patient is STABLE and is discharged to home in good condition.    Final Clinical Impressions(s) / ED Diagnoses   Final diagnoses:  Musculoskeletal pain  Chest wall pain  Nausea  Cough  Tobacco user    ED Discharge Orders         Ordered    ondansetron (ZOFRAN ODT) 4 MG disintegrating tablet  Every 8 hours PRN     09/27/18 [address]           Francies Inch, [place]ensworthMercedes, [place] JerseyPA-C 09/27/18 1601    Mesner, Barbara CowerJason, MD 09/28/18 [phone]

## 2018-09-27 NOTE — ED Notes (Signed)
WHEELED PATIENT TO THE BATHROOM PATIENT IS NOW BACK IN BED

## 2018-09-27 NOTE — ED Notes (Signed)
Got patient undress on the monitor did ekg shown to Dr Mesner patient is resting with call bell in reach 

## 2018-09-27 NOTE — ED Notes (Signed)
Pt verbalized discharge instructions, prescriptions and follow-up care.

## 2018-10-02 ENCOUNTER — Ambulatory Visit: Payer: Medicaid Other

## 2018-10-03 ENCOUNTER — Other Ambulatory Visit: Payer: Self-pay

## 2018-10-03 ENCOUNTER — Ambulatory Visit (INDEPENDENT_AMBULATORY_CARE_PROVIDER_SITE_OTHER): Payer: Self-pay | Admitting: Internal Medicine

## 2018-10-03 VITALS — BP 123/62 | HR 79 | Temp 98.8°F | Ht 61.0 in | Wt 169.2 lb

## 2018-10-03 DIAGNOSIS — K658 Other peritonitis: Secondary | ICD-10-CM

## 2018-10-03 DIAGNOSIS — M328 Other forms of systemic lupus erythematosus: Secondary | ICD-10-CM

## 2018-10-03 DIAGNOSIS — M329 Systemic lupus erythematosus, unspecified: Secondary | ICD-10-CM

## 2018-10-03 DIAGNOSIS — Z79899 Other long term (current) drug therapy: Secondary | ICD-10-CM

## 2018-10-03 DIAGNOSIS — Z8679 Personal history of other diseases of the circulatory system: Secondary | ICD-10-CM

## 2018-10-03 MED ORDER — HYDROXYCHLOROQUINE SULFATE 200 MG PO TABS: 200.0000 mg | ORAL_TABLET | Freq: Every day | ORAL | 1 refills | Status: DC

## 2018-10-03 MED ORDER — IBUPROFEN 800 MG PO TABS: 800.0000 mg | ORAL_TABLET | Freq: Three times a day (TID) | ORAL | 0 refills | Status: DC

## 2018-10-03 NOTE — Progress Notes (Signed)
Internal Medicine Clinic Attending  Case discussed with Dr. Harbrecht at the time of the visit.  We reviewed the resident's history and exam and pertinent patient test results.  I agree with the assessment, diagnosis, and plan of care documented in the resident's note.   

## 2018-10-03 NOTE — Progress Notes (Signed)
   CC: presents today for ER follow-up visit  HPI:[name] is a 38 y.o. female who presents for evaluation of her chest pain, joint pain and facial rash. Please see individual problem based A/P for details.  Lupus: The patient noted that she had experienced chest pain that took her to the ER where a CT angio, Troponin and EKG were unremarkable for acute life threatening illness. The pain was similar in nature to her prior pericarditis although it was worse with leaning forward and there were not applicable changes on EKG. Radiology noted a small, improved pericardial effusion on CT. She attested to a facial rash in the nasolabial folds and upper cheeks as well as slight worsening of her knee pain bilaterally. This appears to be improving slightly since onset. The chest pain has also greatly improved but persists at a low level.  I feel that her chest pain was likely pericarditis or a form of serositis given her history of lupus and the facial rash and worsening knee pain.   Plan: Refill Plaquenil x 2 months Ibuprofen 800mg  TID for 10 days Return in 18 days She is to call her Rheumatologist to attempt to bump up the appointment from March. Continue Gabapentin 900mg  QHS, although this has not resolved her pain, it has had a minimal benefit, more than prior treatments and she would like to give this another few weeks.   PHQ-9: Based on the patients    Office Visit from 10/03/2018 in [facility]  PHQ-9 Total Score  6     score we have decided to continue to monitor.  Past Medical History:  Diagnosis Date  . Bronchitis   . Fibromyalgia   . Lupus (HCC)   . Pleuritis 12/31/2017   Review of Systems:  HPI negative except as per HPI.  Physical Exam: Vitals:   10/03/18 1435  BP: 123/62  Pulse: 79  Temp: 98.8 F (37.1 C)  TempSrc: Oral  SpO2: 100%  Weight: 169 lb 3.2 oz (76.7 kg)  Height: 5\' 1"  (1.549 m)   General: A/O x4, in no acute distress, afebrile,  nondiaphoretic HEENT: No rash noted on the cheeks Cardio: RRR, no mrgs Pulmonary: CTA bilaterally  Assessment & Plan:   See Encounters Tab for problem based charting.  Patient discussed with Dr. Rogelia Boga

## 2018-10-03 NOTE — Assessment & Plan Note (Signed)
Lupus: The patient noted that she had experienced chest pain that took her to the ER where a CT angio, Troponin and EKG were unremarkable for acute life threatening illness. The pain was similar in nature to her prior pericarditis although it was worse with leaning forward and there were not applicable changes on EKG. Radiology noted a small, improved pericardial effusion on CT. She attested to a facial rash in the nasolabial folds and upper cheeks as well as slight worsening of her knee pain bilaterally. This appears to be improving slightly since onset. The chest pain has also greatly improved but persists at a low level.  I feel that her chest pain was likely pericarditis or a form of serositis given her history of lupus and the facial rash and worsening knee pain.   Plan: Refill Plaquenil x 2 months Ibuprofen 800mg  TID for 10 days Return in 18 days She is to call her Rheumatologist to attempt to bump up the appointment from March. Continue Gabapentin 900mg  QHS, although this has not resolved her pain, it has had a minimal benefit, more than prior treatments and she would like to give this another few weeks.

## 2018-10-03 NOTE — Patient Instructions (Signed)
FOLLOW-UP INSTRUCTIONS When: On January 21st as scheduled For: Follow-up of your pain What to bring: All of your medications  I have added ibuprofen 800mg 's three times daily for 10 days to your medications today. Please take this until complete and return for follow-up.  Today we discussed your chest pain, facial rash, and joint pain. I feel that they are related to you Lupus and will improve with treatment. Please contact your Rheumatologist to determine if they have a visit that is sooner than the current time in March.   Thank you for your visit to the [facility] today. If you have any questions or concerns please call us at [phone].

## 2018-10-21 ENCOUNTER — Other Ambulatory Visit: Payer: Self-pay

## 2018-10-21 ENCOUNTER — Ambulatory Visit (INDEPENDENT_AMBULATORY_CARE_PROVIDER_SITE_OTHER): Payer: Self-pay | Admitting: Internal Medicine

## 2018-10-21 VITALS — BP 128/76 | HR 73 | Temp 98.7°F | Ht 61.0 in | Wt 170.3 lb

## 2018-10-21 DIAGNOSIS — M329 Systemic lupus erythematosus, unspecified: Secondary | ICD-10-CM

## 2018-10-21 DIAGNOSIS — Z791 Long term (current) use of non-steroidal anti-inflammatories (NSAID): Secondary | ICD-10-CM

## 2018-10-21 DIAGNOSIS — Z79899 Other long term (current) drug therapy: Secondary | ICD-10-CM

## 2018-10-21 DIAGNOSIS — M328 Other forms of systemic lupus erythematosus: Secondary | ICD-10-CM

## 2018-10-21 DIAGNOSIS — K658 Other peritonitis: Secondary | ICD-10-CM

## 2018-10-21 DIAGNOSIS — K219 Gastro-esophageal reflux disease without esophagitis: Secondary | ICD-10-CM

## 2018-10-21 DIAGNOSIS — R0789 Other chest pain: Secondary | ICD-10-CM

## 2018-10-21 DIAGNOSIS — M791 Myalgia, unspecified site: Secondary | ICD-10-CM

## 2018-10-21 MED ORDER — OMEPRAZOLE 40 MG PO CPDR: 40.0000 mg | DELAYED_RELEASE_CAPSULE | Freq: Every day | ORAL | 2 refills | Status: DC

## 2018-10-21 MED ORDER — IBUPROFEN 600 MG PO TABS: 600.0000 mg | ORAL_TABLET | Freq: Two times a day (BID) | ORAL | 0 refills | Status: DC | PRN

## 2018-10-21 MED ORDER — CYCLOBENZAPRINE HCL 5 MG PO TABS: 5.0000 mg | ORAL_TABLET | Freq: Three times a day (TID) | ORAL | 1 refills | Status: DC | PRN

## 2018-10-21 NOTE — Patient Instructions (Signed)
FOLLOW-UP INSTRUCTIONS When: With Dr. Delma Officer next month For: A routine visit What to bring: All of your medications   I have stopped the famotidine and started omeprazole to better protect your stomach lining while you are taking the high dose ibuprofen. Please continue this, and the muscle relaxant for your pain as prescribed.   Thank you for your visit to the [facility] today. If you have any questions or concerns please call us at [phone].

## 2018-10-21 NOTE — Progress Notes (Signed)
Internal Medicine Clinic Attending  Case discussed with Dr. Harbrecht at the time of the visit.  We reviewed the resident's history and exam and pertinent patient test results.  I agree with the assessment, diagnosis, and plan of care documented in the resident's note.   

## 2018-10-21 NOTE — Progress Notes (Signed)
   CC: chest wall pain  HPI:[name] is a 38 y.o. female who presents for evaluation of bilateral diffuse chest wall discomfort with deep breaths. Please see individual problem based A/P for details.  Myalgias and chest wall discomfort: Likely 2/2 to lupus absent EKG or initial troponin positive evaluation in the ER earlier this month.  Patient states that today she is feeling significantly better.  She has been able to sleep much more easily with the increased dose of gabapentin. The ibuprofen combined with cyclobenzaprine has provided notable relief of her myalgias.  Given the risk of gastric ulceration with prolonged NSAID use I advised her to decrease the dose to 600 mg twice daily and we will utilize a PPI for ulcer prophylaxis.  Today she denies headache, visual changes, chest pain, cough, fever, chills, nausea, vomiting, abdominal pain, but continues to endorse mild myalgias that are worse with the cold weather.  She has not been able to change her current appointment with her rheumatologist early than March.  Plan: Follow-up with PCP in February Continue cyclobenzaprine 5 mg as needed for muscle aches Continue ibuprofen 70 mg twice daily Omeprazole 40 mg daily Gabapentin 100 mg nightly  PHQ-9: Based on the patients    Office Visit from 10/21/2018 in [facility]  PHQ-9 Total Score  6     score we have decided to continue current therapy to treat the underlying cause of her symptoms.  Past Medical History:  Diagnosis Date  . Bronchitis   . Fibromyalgia   . Lupus (HCC)   . Pleuritis 12/31/2017   Review of Systems: ROS negative except as per HPI  Physical Exam: Vitals:   10/21/18 0900  BP: 128/76  Pulse: 73  Temp: 98.7 F (37.1 C)  TempSrc: Oral  SpO2: 100%  Weight: 170 lb 4.8 oz (77.2 kg)  Height: 5\' 1"  (1.549 m)   General: A/O x4, in no acute distress, afebrile, nondiaphoretic Cardio: RRR, no mrg's Pulmonary: CTA bilaterally MSK: BLE  nontender, nonedematous  Assessment & Plan:   See Encounters Tab for problem based charting.  Patient discussed with Dr. Rogelia Boga

## 2018-12-04 NOTE — Progress Notes (Signed)
   CC: SLE follow-up  HPI:  [name] is a 38 y.o. female with SLE, fibromyalgia, history of pericardial effusion who presents for SLE follow-up. Please see problem based charting for evaluation, assessment, and plan.  Past Medical History:  Diagnosis Date  . Bronchitis   . Fibromyalgia   . Lupus (HCC)   . Pleuritis 12/31/2017   Review of Systems:    Review of Systems  Constitutional: Positive for chills. Negative for fever.  Respiratory: Positive for cough.   Cardiovascular: Negative for chest pain.  Gastrointestinal: Positive for nausea. Negative for abdominal pain and vomiting.  Musculoskeletal: Positive for joint pain and myalgias.  Skin: Negative for rash.  Neurological: Positive for headaches. Negative for dizziness.   Physical Exam:  Vitals:   12/05/18 1535  BP: 137/76  Pulse: 76  Temp: 97.9 F (36.6 C)  TempSrc: Oral  SpO2: 100%  Weight: 165 lb 9.6 oz (75.1 kg)  Height: 5\' 1"  (1.549 m)  Physical Exam  Constitutional: She appears well-developed. She appears lethargic.  HENT:  Head: Normocephalic and atraumatic.  Cardiovascular: Normal rate, regular rhythm and normal heart sounds.  Respiratory: Effort normal and breath sounds normal. No respiratory distress. She has no wheezes.  GI: Soft. Bowel sounds are normal. She exhibits no distension. There is no abdominal tenderness.  Musculoskeletal:        General: No edema.     Comments: Tenderness to palpation in neck, thoracic and lumbar back  Neurological: She appears lethargic.  Psychiatric: She has a normal mood and affect. Her behavior is normal. Judgment and thought content normal.   Assessment & Plan:   See Encounters Tab for problem based charting.  Patient discussed with Dr. Heide Spark

## 2018-12-05 ENCOUNTER — Encounter: Payer: Self-pay | Admitting: Internal Medicine

## 2018-12-05 ENCOUNTER — Ambulatory Visit (INDEPENDENT_AMBULATORY_CARE_PROVIDER_SITE_OTHER): Payer: Self-pay | Admitting: Internal Medicine

## 2018-12-05 ENCOUNTER — Other Ambulatory Visit: Payer: Self-pay

## 2018-12-05 ENCOUNTER — Ambulatory Visit (HOSPITAL_COMMUNITY)
Admission: RE | Admit: 2018-12-05 | Discharge: 2018-12-05 | Disposition: A | Discharge status: Home / Self Care | Payer: Medicaid Other | Source: Ambulatory Visit | Attending: Internal Medicine | Admitting: Internal Medicine

## 2018-12-05 VITALS — BP 137/76 | HR 76 | Temp 97.9°F | Ht 61.0 in | Wt 165.6 lb

## 2018-12-05 DIAGNOSIS — M797 Fibromyalgia: Secondary | ICD-10-CM

## 2018-12-05 DIAGNOSIS — Z8679 Personal history of other diseases of the circulatory system: Secondary | ICD-10-CM

## 2018-12-05 DIAGNOSIS — M3219 Other organ or system involvement in systemic lupus erythematosus: Secondary | ICD-10-CM

## 2018-12-05 DIAGNOSIS — Z791 Long term (current) use of non-steroidal anti-inflammatories (NSAID): Secondary | ICD-10-CM

## 2018-12-05 DIAGNOSIS — Z0000 Encounter for general adult medical examination without abnormal findings: Secondary | ICD-10-CM

## 2018-12-05 DIAGNOSIS — M329 Systemic lupus erythematosus, unspecified: Secondary | ICD-10-CM

## 2018-12-05 DIAGNOSIS — Z79899 Other long term (current) drug therapy: Secondary | ICD-10-CM

## 2018-12-05 MED ORDER — PREDNISONE 10 MG PO TABS: ORAL_TABLET | ORAL | 0 refills | Status: AC

## 2018-12-05 NOTE — Patient Instructions (Signed)
It was a pleasure to see you today Ms. Buchberger. Please make the following changes:  -Please get a chest xray done  -Please use prednisone taper as instructed  -Please continue using gabapentin, ibuprofen 600mg , and flexeril for your pain  If you have any questions or concerns, please call our clinic at [phone] between 9am-5pm and after hours call [phone] and ask for the internal medicine resident on call. If you feel you are having a medical emergency please call 911.   Thank you, we look forward to help you remain healthy!  Lorenso Courier, MD Internal Medicine PGY2

## 2018-12-06 NOTE — Assessment & Plan Note (Signed)
Pap smear deferred as patient was on her menstrual cycle during this visit

## 2018-12-06 NOTE — Assessment & Plan Note (Signed)
The patient states that she has been feeling very weak today requiring her to call of work and having pain in her torso, lower back and legs. The patient is also having chest pain when she is taking a deep breath.   Assessment and plan  The patient is currently taking an alternating dose of 400 and 200mg  of hydroxychloroquine. She has also been taking ibuprofen 600mg  tid, flexeril 5mg  twice daily, and gabapentin 900mg  nightly (but misses doses occasionally).   It appears that the patient is having a lupus flare. She is scheduled to have a follow up appointment with her rheumatologist in 1 week.  -Placed patient on a prednisone taper for 12 days  -Told the patient to continue taking gabapentin, ibuprofen,and flexeril -Ordered chest xray to evaluate for causes of her pleuritic chest pain -cxr did not show infiltrate, pleural effusion, or pericardial effusion

## 2018-12-08 NOTE — Progress Notes (Signed)
Internal Medicine Clinic Attending  Case discussed with Dr. Chundi at the time of the visit.  We reviewed the resident's history and exam and pertinent patient test results.  I agree with the assessment, diagnosis, and plan of care documented in the resident's note. 

## 2018-12-12 ENCOUNTER — Encounter: Payer: Self-pay | Admitting: Internal Medicine

## 2018-12-23 ENCOUNTER — Other Ambulatory Visit: Payer: Self-pay

## 2018-12-23 ENCOUNTER — Telehealth: Payer: Medicaid Other | Admitting: Internal Medicine

## 2018-12-23 ENCOUNTER — Telehealth (INDEPENDENT_AMBULATORY_CARE_PROVIDER_SITE_OTHER): Payer: Self-pay | Admitting: *Deleted

## 2018-12-23 DIAGNOSIS — M3219 Other organ or system involvement in systemic lupus erythematosus: Secondary | ICD-10-CM

## 2018-12-23 DIAGNOSIS — R059 Cough, unspecified: Secondary | ICD-10-CM

## 2018-12-23 DIAGNOSIS — R05 Cough: Secondary | ICD-10-CM

## 2018-12-23 NOTE — Telephone Encounter (Addendum)
[facility] Telephone triage for respiratory symptoms  This is a telephone encounter between [employer] and [employer] on 12/23/2018. The visit was conducted with the patient located at home and myself at [facility]. The patient's identity was confirmed using their DOB and current address.   Do you have a fever? Yes - objective at 99.5  Do you have a cough? Yes - productive of "little bit of mucous" unknown color Do you have shortness of breath more than normal? Yes - mild OR with significant movement Do you have chest pain?Yes, with deep inspiration and movement of right side Are you able to eat and drink normally? Yes but not as much Have you seen a physician for these symptoms? No  I have reviewed the patients PMHx and medications.  Does the patient belong to an at risk population? (62 yrs age or older, chronic disease (cardiac, pulmonary, renal, severe obesity BMI >40, liver, diabetes, malignancy), immunocompromised, or pregnancy or within weeks after delivery / postpartum) Yes - hx of lupus and taking plaquenil  Patient instructed to wait by phone as [facility] Resident will call to follow up with her. Confirmed [facility] number [phone]. Message sent to front office to schedule virtual appt and arrive patient. Kinnie Feil, RN, BSN

## 2018-12-23 NOTE — Progress Notes (Signed)
I spent 12 minutes on this telehealth visit, documentation is under telephone encounter from today 12/23/18. Gust Rung, DO

## 2018-12-23 NOTE — Telephone Encounter (Signed)
[facility] Telephone triage for respiratory symptoms  This is a telephone encounter between [employer] and Gust Rung on 12/23/2018. The visit was conducted with the patient located at home and myself at [facility]. The patient's identity was confirmed using their DOB and current address. The patient has consented to being evaluated through a telephone encounter and understands the associated risks/benefits. For Medicaid/Medicare/[employer] members it was discussed that they would not be responsible for an co-insurance, co-pay, or deductible for this telephone encounter.  Do you have a fever? Yes - objective at 99.5  Do you have a cough? Yes - non-productive Do you have shortness of breath more than normal? Yes - mild OR with significant movement Do you have chest pain?Yes right side Are you able to eat and drink normally? Yes but not as much Have you seen a physician for these symptoms? No  I have reviewed the patients PMHx and medications.  Does the patient belong to an at risk population? (14 yrs age or older, chronic disease (cardiac, pulmonary, renal, severe obesity BMI >40, liver, diabetes, malignancy), immunocompromised, or pregnancy or within weeks after delivery / postpartum) Yes - lupus  Patient appears to have had no change in baseline functional status. Based on the patient's age and comorbidies they are at mild risk for further decompensation.   Overall she reports to me main symptoms are right side back and chest pain, non productive cough and some myalgias on the legs and neck. She does have a sore throat. She has no known COVID contacts, she does have 2 children and husband at home with her.  She feels this may be a mild lupus or fibromyalgia flare rather than coronavirus.   I discussed with her that she does not have a high fever, and symptoms are non specific.  At this time I would recommend her to remain at home in self quarantine for 14 days, if shortness of breath worsens she  will need to call us or potentially go to the ED.  Patient has been instructed to Stay at home and provided general health advice including the need to remain in quarantine for 14 days. Patient to phone if symptoms worsen or call 911 if condition becomes unstable.   With the need for self quarantine the patient's supply of medications/food/water was assessed. The patient currently does not have enough medications/food/water to last 14 days and therefore the RN/Pharmacy has been notified have been notified to assist the patient.   The patient does need follow-up in 5-7 days by an Charity fundraiser.   This visit was conducted using telehealth. I have personally spent 12 discussing the patient's current clinical status, reviewing necessary PMHx/medications, and ensure adequate understanding of their current medical illness.    MEDICAID   [id number] 5-10 minutes  [id number] 11-20 minutes  [id number] 21-30 minutes

## 2018-12-25 ENCOUNTER — Telehealth: Payer: Self-pay

## 2018-12-25 NOTE — Telephone Encounter (Signed)
Requesting to speak with a nurse about pain and a note for work. Please call pt back.

## 2018-12-25 NOTE — Telephone Encounter (Signed)
Call made to patient-needs a note for work Diana Huynh) faxed to [phone] stating she needs to "self-quarantine" as instructed by  Dr Mikey Bussing during 12/23/18 phone call.  Pt would also like something for the pain that she is having "all over her body" (neck, legs, arms) pain 7/10, motrin and muscle relaxers not working.  States as of 3pm today her temp was 99.1. Will send to pcp for further review and instructions.  Please advise.Kingsley Spittle Cassady3/26/20204:30 PM

## 2018-12-26 ENCOUNTER — Encounter: Payer: Self-pay | Admitting: Internal Medicine

## 2018-12-26 ENCOUNTER — Telehealth: Payer: Self-pay | Admitting: *Deleted

## 2018-12-26 ENCOUNTER — Telehealth: Payer: Self-pay | Admitting: Internal Medicine

## 2018-12-26 MED ORDER — HYDROCODONE-ACETAMINOPHEN 5-325 MG PO TABS: 1.0000 | ORAL_TABLET | Freq: Three times a day (TID) | ORAL | 0 refills | Status: DC | PRN

## 2018-12-26 NOTE — Telephone Encounter (Signed)
Please see phone note  

## 2018-12-26 NOTE — Telephone Encounter (Signed)
Called back patient. She stated that she is in a lot of pain (over back, neck, arms, legs) and has a headache. She states that she has been having difficulty moving.   She states that she has been having elevated temperature of 99 and cough.  -Instructed patient to remain out of work till all upper respiratory symptoms resolve  -Patient is to call and follow up with rheumatologist at first available appointment due to frequent lupus flares -Prescribed short course of norco 5-328mg  q8hrs for 5 days #15  Lorenso Courier, MD Internal Medicine PGY2 Pager:[phone] 12/26/2018, 11:33 AM

## 2018-12-26 NOTE — Telephone Encounter (Signed)
Pt calls and is crying, states she is coughing and having great pain, she is crying, she is ask to use alternately tylenol and ibuprofen, she states she has been doing that but her pain is too great. She states she could be having a lupus or FM flare. Please advise

## 2018-12-26 NOTE — Telephone Encounter (Signed)
Attending Medicine: I discussed with you & agree with plan

## 2018-12-26 NOTE — Telephone Encounter (Signed)
I have placed letter in chart. The patient can use ibuprofen for pain control.

## 2018-12-29 ENCOUNTER — Emergency Department (HOSPITAL_COMMUNITY): Payer: Medicaid Other

## 2018-12-29 ENCOUNTER — Other Ambulatory Visit: Payer: Self-pay

## 2018-12-29 ENCOUNTER — Encounter (HOSPITAL_COMMUNITY): Payer: Self-pay | Admitting: Emergency Medicine

## 2018-12-29 ENCOUNTER — Emergency Department (HOSPITAL_COMMUNITY)
Admission: EM | Admit: 2018-12-29 | Discharge: 2018-12-29 | Disposition: A | Discharge status: Home / Self Care | Payer: Medicaid Other | Attending: Emergency Medicine | Admitting: Emergency Medicine

## 2018-12-29 DIAGNOSIS — F17210 Nicotine dependence, cigarettes, uncomplicated: Secondary | ICD-10-CM | POA: Insufficient documentation

## 2018-12-29 DIAGNOSIS — R05 Cough: Secondary | ICD-10-CM | POA: Diagnosis not present

## 2018-12-29 DIAGNOSIS — R0789 Other chest pain: Secondary | ICD-10-CM | POA: Diagnosis not present

## 2018-12-29 DIAGNOSIS — Z79899 Other long term (current) drug therapy: Secondary | ICD-10-CM | POA: Insufficient documentation

## 2018-12-29 DIAGNOSIS — R059 Cough, unspecified: Secondary | ICD-10-CM

## 2018-12-29 LAB — COMPREHENSIVE METABOLIC PANEL
ALT: 27 U/L (ref 0–44)
AST: 27 U/L (ref 15–41)
Albumin: 4.1 g/dL (ref 3.5–5.0)
Alkaline Phosphatase: 50 U/L (ref 38–126)
Anion gap: 9 (ref 5–15)
BUN: 6 mg/dL (ref 6–20)
CHLORIDE: 104 mmol/L (ref 98–111)
CO2: 22 mmol/L (ref 22–32)
Calcium: 9.2 mg/dL (ref 8.9–10.3)
Creatinine, Ser: 0.81 mg/dL (ref 0.44–1.00)
GFR calc Af Amer: >60 mL/min (ref >60)
GFR calc non Af Amer: >60 mL/min (ref >60)
Glucose, Bld: 98 mg/dL (ref 70–99)
POTASSIUM: 4.1 mmol/L (ref 3.5–5.1)
Sodium: 135 mmol/L (ref 135–145)
Total Bilirubin: 0.5 mg/dL (ref 0.3–1.2)
Total Protein: 7.7 g/dL (ref 6.5–8.1)

## 2018-12-29 LAB — CBC WITH DIFFERENTIAL/PLATELET
Abs Immature Granulocytes: 0.03 10*3/uL (ref 0.00–0.07)
Basophils Absolute: 0.1 10*3/uL (ref 0.0–0.1)
Basophils Relative: 1 %
EOS ABS: 0.1 10*3/uL (ref 0.0–0.5)
Eosinophils Relative: 1 %
HCT: 37.8 % (ref 36.0–46.0)
Hemoglobin: 12 g/dL (ref 12.0–15.0)
Immature Granulocytes: 0 %
Lymphocytes Relative: 21 %
Lymphs Abs: 1.5 10*3/uL (ref 0.7–4.0)
MCH: 26.8 pg (ref 26.0–34.0)
MCHC: 31.7 g/dL (ref 30.0–36.0)
MCV: 84.4 fL (ref 80.0–100.0)
MONOS PCT: 8 %
Monocytes Absolute: 0.6 10*3/uL (ref 0.1–1.0)
NEUTROS PCT: 69 %
Neutro Abs: 4.7 10*3/uL (ref 1.7–7.7)
Platelets: UNDETERMINED 10*3/uL (ref 150–400)
RBC: 4.48 MIL/uL (ref 3.87–5.11)
RDW: 15.3 % (ref 11.5–15.5)
WBC: 6.9 10*3/uL (ref 4.0–10.5)
nRBC: 0 % (ref 0.0–0.2)

## 2018-12-29 LAB — TROPONIN I: Troponin I: <0.03 ng/mL (ref <0.03)

## 2018-12-29 LAB — D-DIMER, QUANTITATIVE: D-Dimer, Quant: 0.73 ug/mL-FEU — ABNORMAL HIGH (ref 0.00–0.50)

## 2018-12-29 MED ORDER — IOHEXOL 350 MG/ML SOLN
75.0000 mL | Freq: Once | INTRAVENOUS | Status: AC | PRN
  Administered 2018-12-29: 75 mL via INTRAVENOUS

## 2018-12-29 MED ORDER — BENZONATATE 100 MG PO CAPS: 100.0000 mg | ORAL_CAPSULE | Freq: Three times a day (TID) | ORAL | 0 refills | Status: DC

## 2018-12-29 MED ORDER — MORPHINE SULFATE (PF) 4 MG/ML IV SOLN
4.0000 mg | Freq: Once | INTRAVENOUS | Status: AC
  Administered 2018-12-29: 4 mg via INTRAVENOUS
  Filled 2018-12-29: qty 1

## 2018-12-29 MED ORDER — HYDROCOD POLST-CPM POLST ER 10-8 MG/5ML PO SUER
5.0000 mL | Freq: Once | ORAL | Status: AC
  Administered 2018-12-29: 5 mL via ORAL
  Filled 2018-12-29: qty 5

## 2018-12-29 MED ORDER — ONDANSETRON HCL 4 MG/2ML IJ SOLN
4.0000 mg | Freq: Once | INTRAMUSCULAR | Status: AC
  Administered 2018-12-29: 4 mg via INTRAVENOUS
  Filled 2018-12-29: qty 2

## 2018-12-29 NOTE — ED Notes (Signed)
Patient verbalizes understanding of discharge instructions. Opportunity for questioning and answers were provided. Armband removed by staff, pt discharged from ED.  

## 2018-12-29 NOTE — ED Triage Notes (Signed)
Pt arrives to ED from home with complaints of chest pain, cough, and generalized body aches since Tuesday of last week.

## 2018-12-29 NOTE — Discharge Instructions (Signed)
Please read and follow all provided instructions.  Your diagnoses today include:  1. Cough   2. Chest wall pain     Tests performed today include:  An EKG of your heart  A chest x-ray  Cardiac enzymes - a blood test for heart muscle damage was normal  Blood counts and electrolytes  CT scan of your chest - appears normal without signs of blood clot or pneumonia  Vital signs. See below for your results today.   Medications prescribed:   Tessalon Perles - cough suppressant medication  Take any prescribed medications only as directed.  Follow-up instructions: Please follow-up with your primary care provider and rheumotologist for further evaluation of your symptoms.   Return instructions:  SEEK IMMEDIATE MEDICAL ATTENTION IF:  You have severe chest pain, especially if the pain is crushing or pressure-like and spreads to the arms, back, neck, or jaw, or if you have sweating, nausea (feeling sick to your stomach), or shortness of breath. THIS IS AN EMERGENCY. Don't wait to see if the pain will go away. Get medical help at once. Call 911 or 0 (operator). DO NOT drive yourself to the hospital.   Your chest pain gets worse and does not go away with rest.   You have an attack of chest pain lasting longer than usual, despite rest and treatment with the medications your caregiver has prescribed.   You wake from sleep with chest pain or shortness of breath.  You feel dizzy or faint.  You have chest pain not typical of your usual pain for which you originally saw your caregiver.   You have any other emergent concerns regarding your health.  Additional Information: Chest pain comes from many different causes. Your caregiver has diagnosed you as having chest pain that is not specific for one problem, but does not require admission.  You are at low risk for an acute heart condition or other serious illness.   Your vital signs today were: BP 131/84    Pulse 92    Temp 97.8 F (36.6  C) (Oral)    Resp 15    Ht 5\' 1"  (1.549 m)    Wt 77.1 kg    LMP 12/03/2018 (Exact Date)    SpO2 100%    BMI 32.12 kg/m  If your blood pressure (BP) was elevated above 135/85 this visit, please have this repeated by your doctor within one month. --------------

## 2018-12-29 NOTE — ED Provider Notes (Signed)
[facility] EMERGENCY DEPARTMENT Provider Note   CSN: [id number] Arrival date & time: 12/29/18  1120    History   Chief Complaint Chief Complaint  Patient presents with  . Cough  . Chest Pain    HPI Diana Huynh is a 38 y.o. female.     Patient with history of lupus on Plaquenil, fibromyalgia presents to the emergency department with complaint of body aches and cough for 7 days.  Symptoms worsened in the past 24 hours.  Patient feels more short of breath today especially with exertion.  Patient had a temperature of 100 F when her symptoms started but since that time have been in the 98 to 99 degree range.  No nausea, vomiting, or diarrhea.  Patient has had worsening right-sided chest pain today as well as back pain.  She has aching pains in her legs which is chronic to some extent.  However, it was more noticeable yesterday.  No lower extremity swelling.  Patient does not take any breathing medications and does not have a history of asthma.  Onset of symptoms acute.  Patient works at [employer] and has no definitive sick contacts.  Diana Huynh was evaluated in Emergency Department on 12/29/2018 for the symptoms described in the history of present illness. She was evaluated in the context of the global COVID-19 pandemic, which necessitated consideration that the patient might be at risk for infection with the SARS-CoV-2 virus that causes COVID-19. Institutional protocols and algorithms that pertain to the evaluation of patients at risk for COVID-19 are in a state of rapid change based on information released by regulatory bodies including the CDC and federal and state organizations. These policies and algorithms were followed during the patient's care in the ED.      Past Medical History:  Diagnosis Date  . Bronchitis   . Fibromyalgia   . Lupus (HCC)   . Pleuritis 12/31/2017    Patient Active Problem List   Diagnosis Date Noted  . Healthcare maintenance  07/25/2018  . Fibromyalgia 06/25/2018  . Acute non-recurrent frontal sinusitis 02/10/2018  . Viral pericarditis 01/06/2018  . SLE (systemic lupus erythematosus) (HCC) 12/31/2017  . Pericardial effusion     Past Surgical History:  Procedure Laterality Date  . ANKLE SURGERY    . APPENDECTOMY    . CESAREAN SECTION       OB History   No obstetric history on file.      Home Medications    Prior to Admission medications   Medication Sig Start Date End Date Taking? Authorizing Provider  alum & mag hydroxide-simeth (MAALOX REGULAR STRENGTH) 200-200-20 MG/5ML suspension Take 30 mLs by mouth every 6 (six) hours as needed for indigestion or heartburn. 06/30/17   CardamaAmadeo Garnet, MD  cyclobenzaprine (FLEXERIL) 5 MG tablet Take 1 tablet (5 mg total) by mouth 3 (three) times daily as needed for muscle spasms. 10/21/18   Lanelle Bal, MD  gabapentin (NEURONTIN) 300 MG capsule Take 3 capsules (900 mg total) by mouth at bedtime. 09/19/18   Levora Dredge, MD  HYDROcodone-acetaminophen (NORCO) 5-325 MG tablet Take 1 tablet by mouth every 8 (eight) hours as needed for moderate pain. 12/26/18   Chundi, Sherlyn Lees, MD  hydroxychloroquine (PLAQUENIL) 200 MG tablet Take 1 tablet (200 mg total) by mouth daily. 10/03/18   Lanelle Bal, MD  ibuprofen (ADVIL,MOTRIN) 600 MG tablet Take 1 tablet (600 mg total) by mouth every 12 (twelve) hours as needed for moderate pain. 10/21/18   Lanelle Bal,  MD  omeprazole (PRILOSEC) 40 MG capsule Take 1 capsule (40 mg total) by mouth daily. 10/21/18   Lanelle Bal, MD  ondansetron (ZOFRAN ODT) 4 MG disintegrating tablet Take 1 tablet (4 mg total) by mouth every 6 (six) hours as needed. 01/05/18   Ward, Layla Maw, DO  ondansetron (ZOFRAN ODT) 4 MG disintegrating tablet Take 1 tablet (4 mg total) by mouth every 8 (eight) hours as needed for nausea or vomiting. 09/27/18   Street, [place], PA-C    Family History History reviewed. No pertinent family  history.  Social History Social History   Tobacco Use  . Smoking status: Current Every Day Smoker    Packs/day: 0.25    Types: Cigarettes    Last attempt to quit: 09/20/2018    Years since quitting: 0.2  . Smokeless tobacco: Never Used  . Tobacco comment: 4-5 cigs/day.  Substance Use Topics  . Alcohol use: Yes  . Drug use: No     Allergies   Patient has no known allergies.   Review of Systems Review of Systems  Constitutional: Negative for chills and fever.  HENT: Negative for rhinorrhea and sore throat.   Eyes: Negative for redness.  Respiratory: Positive for cough, chest tightness and shortness of breath. Negative for wheezing.   Cardiovascular: Positive for chest pain.  Gastrointestinal: Negative for abdominal pain, diarrhea, nausea and vomiting.  Genitourinary: Negative for dysuria.  Musculoskeletal: Positive for back pain and myalgias.  Skin: Negative for rash.  Neurological: Negative for headaches.     Physical Exam Updated Vital Signs BP (!) 160/87 (BP Location: Right Arm)   Pulse (!) 114   Temp 97.8 F (36.6 C) (Oral)   Resp 17   Ht  (1.549 m)   Wt 77.1 kg   LMP 12/03/2018 (Exact Date)   SpO2 100%   BMI 32.12 kg/m   Physical Exam Vitals signs and nursing note reviewed.  Constitutional:      Appearance: She is well-developed.  HENT:     Head: Normocephalic and atraumatic.  Eyes:     General:        Right eye: No discharge.        Left eye: No discharge.     Conjunctiva/sclera: Conjunctivae normal.  Neck:     Musculoskeletal: Normal range of motion and neck supple.  Cardiovascular:     Rate and Rhythm: Regular rhythm. Tachycardia present.     Heart sounds: Normal heart sounds.  Pulmonary:     Effort: Pulmonary effort is normal. Tachypnea present.     Breath sounds: Normal breath sounds. No decreased breath sounds, wheezing or rhonchi.     Comments: Frequent coughing during exam. Abdominal:     Palpations: Abdomen is soft.      Tenderness: There is no abdominal tenderness.  Musculoskeletal:     Right lower leg: No edema.     Left lower leg: No edema.  Skin:    General: Skin is warm and dry.  Neurological:     Mental Status: She is alert.      ED Treatments / Results  Labs (all labs ordered are listed, but only abnormal results are displayed) Labs Reviewed  D-DIMER, QUANTITATIVE (NOT AT [facility]) - Abnormal; Notable for the following components:      Result Value   D-Dimer, Quant 0.73 (*)    All other components within normal limits  CBC WITH DIFFERENTIAL/PLATELET  COMPREHENSIVE METABOLIC PANEL  TROPONIN I    EKG EKG Interpretation  Date/Time:  Monday December 29 2018 11:34:27 EDT Ventricular Rate:  105 PR Interval:    QRS Duration: 74 QT Interval:  382 QTC Calculation: 505 R Axis:   81 Text Interpretation:  Sinus tachycardia Right atrial enlargement Borderline T abnormalities, anterior leads Borderline prolonged QT interval nonspecific T waves similar to Dec 2019 Confirmed by Pricilla LovelessGoldston, Scott [phone]([id number]) on 12/29/2018 2:46:16 PM   Radiology Ct Angio Chest Pe W And/or Wo Contrast  Result Date: 12/29/2018 CLINICAL DATA:  PE suspected intermediate probability, positive D-dimer. EXAM: CT ANGIOGRAPHY CHEST WITH CONTRAST TECHNIQUE: Multidetector CT imaging of the chest was performed using the standard protocol during bolus administration of intravenous contrast. Multiplanar CT image reconstructions and MIPs were obtained to evaluate the vascular anatomy. CONTRAST:  75mL OMNIPAQUE IOHEXOL 350 MG/ML SOLN COMPARISON:  None. FINDINGS: Cardiovascular: The heart is upper limits of normal size. Aortic arch is within. Common origin the left common carotid artery and the innominate artery. Aneurysm or significant atherosclerotic disease is present. Pulmonary artery opacification excellent. There is some streak artifact from the IVC. Focal filling defects are present to suggest pulmonary embolus. Mediastinum/Nodes: No  significant mediastinal or hilar adenopathy is present. Subcentimeter axillary lymph nodes are present bilaterally. Lungs/Pleura: The lungs are clear without focal nodule, mass, or airspace disease. Upper Abdomen: Upper abdomen is within normal limits. Musculoskeletal: Vertebral body heights and alignment are maintained. There is motion artifact at the sternum. Ribs are unremarkable. Review of the MIP images confirms the above findings. IMPRESSION: 1. No pulmonary embolus. 2. Borderline cardiomegaly without failure. 3. Normal CT appearance of the lungs. 4. Subcentimeter axillary lymph nodes bilaterally. These are likely reactive. Electronically Signed   By: Marin Robertshristopher  Mattern M.D.   On: 12/29/2018 14:56   Dg Chest Port 1 View  Result Date: 12/29/2018 CLINICAL DATA:  Cough, chest pain EXAM: PORTABLE CHEST 1 VIEW COMPARISON:  12/05/2018 FINDINGS: The heart size and mediastinal contours are within normal limits. Both lungs are clear. The visualized skeletal structures are unremarkable. IMPRESSION: No active disease. Electronically Signed   By: Elige KoHetal  Patel   On: 12/29/2018 12:27    Procedures Procedures (including critical care time)  Medications Ordered in ED Medications  chlorpheniramine-HYDROcodone (TUSSIONEX) 10-8 MG/5ML suspension 5 mL (5 mLs Oral Given 12/29/18 1216)  morphine 4 MG/ML injection 4 mg (4 mg Intravenous Given 12/29/18 1402)  ondansetron (ZOFRAN) injection 4 mg (4 mg Intravenous Given 12/29/18 1400)  iohexol (OMNIPAQUE) 350 MG/ML injection 75 mL (75 mLs Intravenous Contrast Given 12/29/18 1432)     Initial Impression / Assessment and Plan / ED Course  I have reviewed the triage vital signs and the nursing notes.  Pertinent labs & imaging results that were available during my care of the patient were reviewed by me and considered in my medical decision making (see chart for details).        Patient seen and examined.  Patient with infectious sounding symptoms over the past  week however with worsening chest pain, shortness of breath, and tachycardia today.  No history of blood clots.  Discussed blood clot screening with the patient.  She may very well have a high d-dimer given her underlying lupus, however PE is on the differential.  Fortunately the patient is not hypoxic.  EKG personally reviewed.  Vital signs reviewed and are as follows: BP (!) 160/87 (BP Location: Right Arm)   Pulse (!) 114   Temp 97.8 F (36.6 C) (Oral)   Resp 17   Ht 5\' 1"  (1.549 m)   Wt  77.1 kg   LMP 12/03/2018 (Exact Date)   SpO2 100%   BMI 32.12 kg/m   1:55 PM Work-up in progress. Awaiting CT angio for PE/PNA. Patient calling out in pain.   3:41 PM patient reexamined.  She appears more comfortable.  Vital signs are within normal limits.  Discussed her reassuring chest CT today.  No signs of pneumonia or PE.  Suspect that her symptoms today are due to her underlying chronic lupus and fibromyalgia.  Patient has follow-up tele-appt with her primary care doctor tomorrow and I encouraged her to keep this.  Medication prescribed for cough.  Encouraged return to the emergency department with fever, worsening shortness of breath or trouble breathing, new symptoms or other concerns.  Final Clinical Impressions(s) / ED Diagnoses   Final diagnoses:  Cough  Chest wall pain   Patient with ongoing cough and complaint of chest pain.  She has a history of lupus and fibromyalgia.  She is immunosuppressed on Plaquenil.  She has had a maximum temperature of 100 F per her report.  No fever today.  No hypoxia.  Patient was tachycardic on arrival to 114.  This increased to 130 during coughing spells.  Patient symptoms were treated in the emergency department during her work-up and heart rate improved.  At this point, low concern for COVID-19.  No indications for hospitalization today.  She has appropriate PCP follow-up and rheumatology follow-up.  ED Discharge Orders         Ordered    benzonatate  (TESSALON) 100 MG capsule  Every 8 hours     12/29/18 1514           Renne Crigler, PA-C 12/29/18 1544    Pricilla Loveless, MD 12/29/18 1544

## 2018-12-30 ENCOUNTER — Ambulatory Visit: Payer: Medicaid Other | Admitting: *Deleted

## 2018-12-30 DIAGNOSIS — R05 Cough: Secondary | ICD-10-CM

## 2018-12-30 DIAGNOSIS — R059 Cough, unspecified: Secondary | ICD-10-CM

## 2018-12-30 NOTE — Progress Notes (Signed)
Telehealth f/u call: Pt stated she's resting and doing ok. She went to the ER yesterday for the severe cough and pain her chest d/t coughing and was prescribed medicine (Benzonatate) for the cough which is helping (less coughing) so the pain in her chest has decreased. Stated she's still a "little weak". Informed to the office is needed;condition worsens-voiced understanding.

## 2019-01-06 ENCOUNTER — Telehealth: Payer: Self-pay | Admitting: Internal Medicine

## 2019-01-06 ENCOUNTER — Other Ambulatory Visit: Payer: Self-pay

## 2019-01-06 ENCOUNTER — Ambulatory Visit (INDEPENDENT_AMBULATORY_CARE_PROVIDER_SITE_OTHER): Payer: Self-pay | Admitting: Internal Medicine

## 2019-01-06 DIAGNOSIS — R059 Cough, unspecified: Secondary | ICD-10-CM | POA: Insufficient documentation

## 2019-01-06 DIAGNOSIS — R05 Cough: Secondary | ICD-10-CM

## 2019-01-06 NOTE — Progress Notes (Signed)
   CC: cough  This is a telephone encounter between [employer] and [employer] on 01/06/2019 for cough. The visit was conducted with the patient located at home and [facility]. The patient's identity was confirmed using their DOB and current address. The patient has consented to being evaluated through a telephone encounter and understands the associated risks (an examination cannot be done and the patient may need to come in for an appointment) / benefits (allows the patient to remain at home, decreasing exposure to coronavirus). I personally spent 7 minutes on medical discussion.   HPI:  [name] is a 38 y.o. with PMH as below.   Please see A&P for assessment of the patient's acute and chronic medical conditions.   Past Medical History:  Diagnosis Date  . Bronchitis   . Fibromyalgia   . Lupus (HCC)   . Pleuritis 12/31/2017   Review of Systems:  Performed and all others negative.  Assessment & Plan:   See Encounters Tab for problem based charting.  Patient discussed with Dr. Cyndie Chime

## 2019-01-06 NOTE — Telephone Encounter (Signed)
Placed on today's [facility] schedule for telehealth visit. Front office and [facility] provider aware. L. [place]atte, RN, BSN  

## 2019-01-06 NOTE — Telephone Encounter (Signed)
Please do a telehealth follow up if patient needs a work excuse and persistent symptoms from what she was seen in ED for

## 2019-01-06 NOTE — Telephone Encounter (Signed)
Pt still have a cough and runny nose and  is requesting a return to work Extension Note.

## 2019-01-06 NOTE — Progress Notes (Signed)
Medicine attending: Medical history, presenting problems,  and medications, reviewed with resident physician Dr Justin Helberg on the day of the patient telephone consultation and I concur with his evaluation and management plan. 

## 2019-01-06 NOTE — Assessment & Plan Note (Signed)
HPI: Patient calls in requesting work note given that she has a cough. She was seen in the ED on 3/30 and at that point had fevers/cough. The fevers have abated but she continues to have a cough, rhinorrhea, and a sore throat. She works at [employer] and was told with the Pandemic that she could not return until her cough has stopped.   A/P: - Cough and sore throat like from post-nasal drip vs post viral bronchitis  - Continue supportive care with Tessalon perils, robitussin, and benadryl  - Discussed that post viral cough can last 4-6 weeks.  - Work note sent via [employer]

## 2019-02-05 ENCOUNTER — Telehealth: Payer: Self-pay | Admitting: *Deleted

## 2019-02-05 ENCOUNTER — Emergency Department (HOSPITAL_COMMUNITY): Payer: Medicaid Other

## 2019-02-05 ENCOUNTER — Other Ambulatory Visit: Payer: Self-pay

## 2019-02-05 ENCOUNTER — Inpatient Hospital Stay (HOSPITAL_COMMUNITY)
Admission: EM | Admit: 2019-02-05 | Discharge: 2019-02-08 | DRG: 547 | Disposition: A | Discharge status: Home / Self Care | Payer: Medicaid Other | Attending: Internal Medicine | Admitting: Internal Medicine

## 2019-02-05 DIAGNOSIS — I10 Essential (primary) hypertension: Secondary | ICD-10-CM | POA: Diagnosis present

## 2019-02-05 DIAGNOSIS — F1290 Cannabis use, unspecified, uncomplicated: Secondary | ICD-10-CM | POA: Diagnosis present

## 2019-02-05 DIAGNOSIS — M797 Fibromyalgia: Secondary | ICD-10-CM | POA: Diagnosis present

## 2019-02-05 DIAGNOSIS — K219 Gastro-esophageal reflux disease without esophagitis: Secondary | ICD-10-CM

## 2019-02-05 DIAGNOSIS — M549 Dorsalgia, unspecified: Secondary | ICD-10-CM | POA: Diagnosis present

## 2019-02-05 DIAGNOSIS — F17210 Nicotine dependence, cigarettes, uncomplicated: Secondary | ICD-10-CM | POA: Diagnosis present

## 2019-02-05 DIAGNOSIS — Z720 Tobacco use: Secondary | ICD-10-CM

## 2019-02-05 DIAGNOSIS — M940 Chondrocostal junction syndrome [Tietze]: Secondary | ICD-10-CM | POA: Diagnosis present

## 2019-02-05 DIAGNOSIS — R918 Other nonspecific abnormal finding of lung field: Secondary | ICD-10-CM | POA: Diagnosis present

## 2019-02-05 DIAGNOSIS — I313 Pericardial effusion (noninflammatory): Secondary | ICD-10-CM

## 2019-02-05 DIAGNOSIS — M329 Systemic lupus erythematosus, unspecified: Secondary | ICD-10-CM | POA: Diagnosis present

## 2019-02-05 DIAGNOSIS — R609 Edema, unspecified: Secondary | ICD-10-CM

## 2019-02-05 DIAGNOSIS — R591 Generalized enlarged lymph nodes: Secondary | ICD-10-CM | POA: Diagnosis present

## 2019-02-05 DIAGNOSIS — R072 Precordial pain: Secondary | ICD-10-CM | POA: Diagnosis present

## 2019-02-05 DIAGNOSIS — M3212 Pericarditis in systemic lupus erythematosus: Secondary | ICD-10-CM

## 2019-02-05 DIAGNOSIS — R0789 Other chest pain: Secondary | ICD-10-CM

## 2019-02-05 DIAGNOSIS — R079 Chest pain, unspecified: Secondary | ICD-10-CM

## 2019-02-05 DIAGNOSIS — Z20828 Contact with and (suspected) exposure to other viral communicable diseases: Secondary | ICD-10-CM | POA: Diagnosis present

## 2019-02-05 DIAGNOSIS — I3139 Other pericardial effusion (noninflammatory): Secondary | ICD-10-CM

## 2019-02-05 DIAGNOSIS — Z79899 Other long term (current) drug therapy: Secondary | ICD-10-CM

## 2019-02-05 LAB — BASIC METABOLIC PANEL
Anion gap: 13 (ref 5–15)
BUN: 12 mg/dL (ref 6–20)
CO2: 22 mmol/L (ref 22–32)
Calcium: 9.4 mg/dL (ref 8.9–10.3)
Chloride: 102 mmol/L (ref 98–111)
Creatinine, Ser: 0.85 mg/dL (ref 0.44–1.00)
GFR calc Af Amer: >60 mL/min (ref >60)
GFR calc non Af Amer: >60 mL/min (ref >60)
Glucose, Bld: 96 mg/dL (ref 70–99)
Potassium: 4.1 mmol/L (ref 3.5–5.1)
Sodium: 137 mmol/L (ref 135–145)

## 2019-02-05 LAB — CBC WITH DIFFERENTIAL/PLATELET
Abs Immature Granulocytes: 0.01 10*3/uL (ref 0.00–0.07)
Basophils Absolute: 0 10*3/uL (ref 0.0–0.1)
Basophils Relative: 0 %
Eosinophils Absolute: 0.1 10*3/uL (ref 0.0–0.5)
Eosinophils Relative: 1 %
HCT: 39.1 % (ref 36.0–46.0)
Hemoglobin: 13.1 g/dL (ref 12.0–15.0)
Immature Granulocytes: 0 %
Lymphocytes Relative: 22 %
Lymphs Abs: 1.5 10*3/uL (ref 0.7–4.0)
MCH: 28 pg (ref 26.0–34.0)
MCHC: 33.5 g/dL (ref 30.0–36.0)
MCV: 83.5 fL (ref 80.0–100.0)
Monocytes Absolute: 0.6 10*3/uL (ref 0.1–1.0)
Monocytes Relative: 8 %
Neutro Abs: 4.8 10*3/uL (ref 1.7–7.7)
Neutrophils Relative %: 69 %
Platelets: 262 10*3/uL (ref 150–400)
RBC: 4.68 MIL/uL (ref 3.87–5.11)
RDW: 15.7 % — ABNORMAL HIGH (ref 11.5–15.5)
WBC: 7 10*3/uL (ref 4.0–10.5)
nRBC: 0 % (ref 0.0–0.2)

## 2019-02-05 LAB — RAPID URINE DRUG SCREEN, HOSP PERFORMED
Amphetamines: NOT DETECTED
Barbiturates: NOT DETECTED
Benzodiazepines: NOT DETECTED
Cocaine: NOT DETECTED
Opiates: POSITIVE — AB
Tetrahydrocannabinol: POSITIVE — AB

## 2019-02-05 LAB — I-STAT BETA HCG BLOOD, ED (MC, WL, AP ONLY): I-stat hCG, quantitative: <5 m[IU]/mL (ref <5)

## 2019-02-05 LAB — HEPATIC FUNCTION PANEL
ALT: 19 U/L (ref 0–44)
AST: 17 U/L (ref 15–41)
Albumin: 4 g/dL (ref 3.5–5.0)
Alkaline Phosphatase: 51 U/L (ref 38–126)
Bilirubin, Direct: 0.1 mg/dL (ref 0.0–0.2)
Indirect Bilirubin: 0.4 mg/dL (ref 0.3–0.9)
Total Bilirubin: 0.5 mg/dL (ref 0.3–1.2)
Total Protein: 8.1 g/dL (ref 6.5–8.1)

## 2019-02-05 LAB — SARS CORONAVIRUS 2 BY RT PCR (HOSPITAL ORDER, PERFORMED IN ~~LOC~~ HOSPITAL LAB): SARS Coronavirus 2: NEGATIVE

## 2019-02-05 LAB — C-REACTIVE PROTEIN: CRP: <0.8 mg/dL (ref <1.0)

## 2019-02-05 LAB — SEDIMENTATION RATE: Sed Rate: 24 mm/hr — ABNORMAL HIGH (ref 0–22)

## 2019-02-05 LAB — TROPONIN I
Troponin I: <0.03 ng/mL (ref <0.03)
Troponin I: <0.03 ng/mL (ref <0.03)

## 2019-02-05 MED ORDER — COLCHICINE 0.6 MG PO TABS
0.6000 mg | ORAL_TABLET | Freq: Every day | ORAL | Status: DC
  Administered 2019-02-05 – 2019-02-08 (×4): 0.6 mg via ORAL
  Filled 2019-02-05 (×4): qty 1

## 2019-02-05 MED ORDER — GABAPENTIN 300 MG PO CAPS
900.0000 mg | ORAL_CAPSULE | Freq: Every day | ORAL | Status: DC
  Filled 2019-02-05: qty 3

## 2019-02-05 MED ORDER — MORPHINE SULFATE (PF) 4 MG/ML IV SOLN
4.0000 mg | Freq: Once | INTRAVENOUS | Status: AC
  Administered 2019-02-05: 17:00:00 4 mg via INTRAVENOUS
  Filled 2019-02-05: qty 1

## 2019-02-05 MED ORDER — LACTATED RINGERS IV SOLN
INTRAVENOUS | Status: AC
  Administered 2019-02-05 – 2019-02-06 (×2): via INTRAVENOUS

## 2019-02-05 MED ORDER — ENOXAPARIN SODIUM 40 MG/0.4ML ~~LOC~~ SOLN
40.0000 mg | SUBCUTANEOUS | Status: DC
  Administered 2019-02-05 – 2019-02-07 (×3): 40 mg via SUBCUTANEOUS
  Filled 2019-02-05 (×3): qty 0.4

## 2019-02-05 MED ORDER — MORPHINE SULFATE (PF) 4 MG/ML IV SOLN
4.0000 mg | Freq: Once | INTRAVENOUS | Status: AC
  Administered 2019-02-05: 14:00:00 4 mg via INTRAVENOUS
  Filled 2019-02-05: qty 1

## 2019-02-05 MED ORDER — IOHEXOL 350 MG/ML SOLN
75.0000 mL | Freq: Once | INTRAVENOUS | Status: AC | PRN
  Administered 2019-02-05: 75 mL via INTRAVENOUS

## 2019-02-05 MED ORDER — KETOROLAC TROMETHAMINE 15 MG/ML IJ SOLN
15.0000 mg | Freq: Once | INTRAMUSCULAR | Status: AC
  Administered 2019-02-05: 22:00:00 15 mg via INTRAVENOUS
  Filled 2019-02-05: qty 1

## 2019-02-05 MED ORDER — MORPHINE SULFATE (PF) 4 MG/ML IV SOLN
4.0000 mg | Freq: Once | INTRAVENOUS | Status: AC
  Administered 2019-02-05: 12:00:00 4 mg via INTRAVENOUS
  Filled 2019-02-05: qty 1

## 2019-02-05 MED ORDER — KETOROLAC TROMETHAMINE 15 MG/ML IJ SOLN
15.0000 mg | Freq: Once | INTRAMUSCULAR | Status: AC
  Administered 2019-02-05: 18:00:00 15 mg via INTRAVENOUS
  Filled 2019-02-05: qty 1

## 2019-02-05 MED ORDER — PANTOPRAZOLE SODIUM 40 MG PO TBEC
40.0000 mg | DELAYED_RELEASE_TABLET | Freq: Every day | ORAL | Status: DC
  Administered 2019-02-05 – 2019-02-06 (×2): 40 mg via ORAL
  Filled 2019-02-05 (×2): qty 1

## 2019-02-05 MED ORDER — HYDROXYCHLOROQUINE SULFATE 200 MG PO TABS
200.0000 mg | ORAL_TABLET | Freq: Every day | ORAL | Status: DC
  Administered 2019-02-05 – 2019-02-08 (×4): 200 mg via ORAL
  Filled 2019-02-05 (×4): qty 1

## 2019-02-05 MED ORDER — IBUPROFEN 400 MG PO TABS
600.0000 mg | ORAL_TABLET | Freq: Three times a day (TID) | ORAL | Status: DC
  Administered 2019-02-05: 600 mg via ORAL
  Filled 2019-02-05 (×3): qty 1

## 2019-02-05 NOTE — ED Provider Notes (Signed)
[facility] EMERGENCY DEPARTMENT Provider Note   CSN: [id number] Arrival date & time: 02/05/19  1045    History   Chief Complaint Chief Complaint  Patient presents with   Chest Pain   Back Pain    HPI Diana Huynh is a 38 y.o. female with a past medical history of lupus currently on hydroxychloroquine, fibromyalgia, bronchitis who presents to ED for 3-day history of left-sided chest pain radiating to the back.  Pain has been intermittent, gradually worsening, worse with any type of movement, palpation, deep inspiration and coughing.  Will radiate to her left upper back.  She does note some shortness of breath when she walks as well as a slightly productive cough with mucus for the past 3 days as well.  She initially attributed this to GERD but the symptoms have not improved with her usual GERD medications.  She notes history of similar symptoms in the past but states that "I think they checked everything and they said everything was all right."  She has not seen her rheumatologist in several months.  She did complete a course of steroids about 2 weeks ago.  States that the pain is preventing her from getting a good night sleep.  She denies any fevers, sick contacts with similar symptoms, recent travel, known exposure to COVID-19, injuries or falls, trauma to the area, vomiting, abdominal pain, body aches.     HPI  Past Medical History:  Diagnosis Date   Bronchitis    Fibromyalgia    Lupus (HCC)    Pleuritis 12/31/2017    Patient Active Problem List   Diagnosis Date Noted   Cough 01/06/2019   Healthcare maintenance 07/25/2018   Fibromyalgia 06/25/2018   Acute non-recurrent frontal sinusitis 02/10/2018   Viral pericarditis 01/06/2018   SLE (systemic lupus erythematosus) (HCC) 12/31/2017   Pericardial effusion     Past Surgical History:  Procedure Laterality Date   ANKLE SURGERY     APPENDECTOMY     CESAREAN SECTION       OB History   No  obstetric history on file.      Home Medications    Prior to Admission medications   Medication Sig Start Date End Date Taking? Authorizing Provider  alum & mag hydroxide-simeth (MAALOX REGULAR STRENGTH) 200-200-20 MG/5ML suspension Take 30 mLs by mouth every 6 (six) hours as needed for indigestion or heartburn. 06/30/17  Yes Cardama, Amadeo Garnet, MD  benzonatate (TESSALON) 100 MG capsule Take 1 capsule (100 mg total) by mouth every 8 (eight) hours. 12/29/18  Yes Renne Crigler, PA-C  cyclobenzaprine (FLEXERIL) 5 MG tablet Take 1 tablet (5 mg total) by mouth 3 (three) times daily as needed for muscle spasms. 10/21/18  Yes Lanelle Bal, MD  famotidine (PEPCID) 20 MG tablet Take 20 mg by mouth daily.   Yes [provider]  gabapentin (NEURONTIN) 300 MG capsule Take 3 capsules (900 mg total) by mouth at bedtime. Patient taking differently: Take 900 mg by mouth daily.  09/19/18  Yes Helberg, Jill Alexanders, MD  hydroxychloroquine (PLAQUENIL) 200 MG tablet Take 1 tablet (200 mg total) by mouth daily. 10/03/18  Yes Lanelle Bal, MD  ibuprofen (ADVIL,MOTRIN) 600 MG tablet Take 1 tablet (600 mg total) by mouth every 12 (twelve) hours as needed for moderate pain. 10/21/18  Yes Lanelle Bal, MD  HYDROcodone-acetaminophen (NORCO) 5-325 MG tablet Take 1 tablet by mouth every 8 (eight) hours as needed for moderate pain. Patient not taking: Reported on 02/05/2019 12/26/18   Chundi,  Vahini, MD  omeprazole (PRILOSEC) 40 MG capsule Take 1 capsule (40 mg total) by mouth daily. Patient not taking: Reported on 02/05/2019 10/21/18   Lanelle BalHarbrecht, Lawrence, MD  ondansetron (ZOFRAN ODT) 4 MG disintegrating tablet Take 1 tablet (4 mg total) by mouth every 6 (six) hours as needed. Patient not taking: Reported on 02/05/2019 01/05/18   Ward, Layla MawKristen N, DO  ondansetron (ZOFRAN ODT) 4 MG disintegrating tablet Take 1 tablet (4 mg total) by mouth every 8 (eight) hours as needed for nausea or vomiting. Patient not  taking: Reported on 02/05/2019 09/27/18   Street, [place], PA-C    Family History No family history on file.  Social History Social History   Tobacco Use   Smoking status: Current Every Day Smoker    Packs/day: 0.25    Types: Cigarettes    Last attempt to quit: 09/20/2018    Years since quitting: 0.3   Smokeless tobacco: Never Used   Tobacco comment: 4-5 cigs/day.  Substance Use Topics   Alcohol use: Yes   Drug use: No     Allergies   Patient has no known allergies.   Review of Systems Review of Systems  Constitutional: Negative for appetite change, chills and fever.  HENT: Negative for ear pain, rhinorrhea, sneezing and sore throat.   Eyes: Negative for photophobia and visual disturbance.  Respiratory: Positive for cough and shortness of breath. Negative for chest tightness and wheezing.   Cardiovascular: Positive for chest pain. Negative for palpitations.  Gastrointestinal: Negative for abdominal pain, blood in stool, constipation, diarrhea, nausea and vomiting.  Genitourinary: Negative for dysuria, hematuria and urgency.  Musculoskeletal: Negative for myalgias.  Skin: Negative for rash.  Neurological: Negative for dizziness, weakness and light-headedness.     Physical Exam Updated Vital Signs BP (!) 141/91    Pulse 89    Temp 98.1 F (36.7 C) (Oral)    Resp 17    Ht 5\' 1"  (1.549 m)    Wt 74.8 kg    LMP  (LMP Unknown)    SpO2 100%    BMI 31.18 kg/m   Physical Exam Vitals signs and nursing note reviewed.  Constitutional:      General: She is not in acute distress.    Appearance: She is well-developed.     Comments: Tearful.  HENT:     Head: Normocephalic and atraumatic.     Nose: Nose normal.  Eyes:     General: No scleral icterus.       Right eye: No discharge.        Left eye: No discharge.     Conjunctiva/sclera: Conjunctivae normal.  Neck:     Musculoskeletal: Normal range of motion and neck supple.  Cardiovascular:     Rate and Rhythm:  Regular rhythm. Tachycardia present.     Heart sounds: Normal heart sounds. No murmur. No friction rub. No gallop.      Comments: Chest TTP in the indicated area. Pulmonary:     Effort: Pulmonary effort is normal. No respiratory distress.     Breath sounds: Normal breath sounds.  Chest:    Abdominal:     General: Bowel sounds are normal. There is no distension.     Palpations: Abdomen is soft.     Tenderness: There is no abdominal tenderness. There is no guarding.  Musculoskeletal: Normal range of motion.       Back:     Comments: No lower extremity edema, erythema or calf tenderness bilaterally.  Skin:  General: Skin is warm and dry.     Findings: No rash.  Neurological:     Mental Status: She is alert.     Motor: No abnormal muscle tone.     Coordination: Coordination normal.      ED Treatments / Results  Labs (all labs ordered are listed, but only abnormal results are displayed) Labs Reviewed  CBC WITH DIFFERENTIAL/PLATELET - Abnormal; Notable for the following components:      Result Value   RDW 15.7 (*)    All other components within normal limits  RAPID URINE DRUG SCREEN, HOSP PERFORMED - Abnormal; Notable for the following components:   Opiates POSITIVE (*)    Tetrahydrocannabinol POSITIVE (*)    All other components within normal limits  SARS CORONAVIRUS 2 (HOSPITAL ORDER, PERFORMED IN [facility])  BASIC METABOLIC PANEL  TROPONIN I  TROPONIN I  I-STAT BETA HCG BLOOD, ED ([facility], [facility], [facility] ONLY)    EKG EKG Interpretation  Date/Time:  Thursday Feb 05 2019 10:57:34 EDT Ventricular Rate:  104 PR Interval:    QRS Duration: 95 QT Interval:  341 QTC Calculation: 449 R Axis:   95 Text Interpretation:  Sinus tachycardia Biatrial enlargement Borderline right axis deviation Abnormal T, consider ischemia, anterior leads Confirmed by Virgina Norfolk [phone]) on 02/05/2019 11:15:06 AM Also confirmed by Virgina Norfolk [phone]), editor Barbette Hair [phone])  on  02/05/2019 11:35:18 AM   Radiology Ct Angio Chest Pe W/cm &/or Wo Cm  Addendum Date: 02/05/2019   ADDENDUM REPORT: 02/05/2019 15:18 ADDENDUM: The diagnosis of Langerhans cell histiocytosis should be considered. Electronically Signed   By: Paulina Fusi M.D.   On: 02/05/2019 15:18   Result Date: 02/05/2019 CLINICAL DATA:  Left-sided chest and back pain beginning 3 days ago. EXAM: CT ANGIOGRAPHY CHEST WITH CONTRAST TECHNIQUE: Multidetector CT imaging of the chest was performed using the standard protocol during bolus administration of intravenous contrast. Multiplanar CT image reconstructions and MIPs were obtained to evaluate the vascular anatomy. CONTRAST:  46mL OMNIPAQUE IOHEXOL 350 MG/ML SOLN COMPARISON:  Chest radiography same day. Previous chest CT a 12/29/2018 FINDINGS: Cardiovascular: Pulmonary arterial opacification is good. There are no pulmonary emboli. Borderline cardiomegaly as seen previously. Small amount of pericardial fluid not seen previously. Mediastinum/Nodes: No mass or lymphadenopathy. Lungs/Pleura: Scattered 3-4 mm nodules now visible in both lungs, not seen previously. Scattered similar-sized cavitary lesions now present in both lungs, more numerous on the right than the left, measuring on average about 6 mm in diameter. These were not present previously. No consolidation, lobar collapse or pleural effusion. Upper Abdomen: Negative Musculoskeletal: Negative Review of the MIP images confirms the above findings. IMPRESSION: No pulmonary emboli. Newly seen small pericardial effusion. This could relate to the patient's diagnosis of lupus. New bilateral scattered small pulmonary nodules and cavitary lesions, 6 mm and smaller. See axial image 48 for Kuntzman example. These are uncommon but can be a known phenomenon in lupus. Electronically Signed: By: Paulina Fusi M.D. On: 02/05/2019 15:04   Dg Chest Portable 1 View  Result Date: 02/05/2019 CLINICAL DATA:  Severe left-sided chest pain for a few  days EXAM: PORTABLE CHEST 1 VIEW COMPARISON:  12/29/2018 FINDINGS: Normal heart size and mediastinal contours. No acute infiltrate or edema. No effusion or pneumothorax. No acute osseous findings. IMPRESSION: Negative chest. Electronically Signed   By: Marnee Spring M.D.   On: 02/05/2019 11:32    Procedures Procedures (including critical care time)  Medications Ordered in ED Medications  morphine  4 MG/ML injection 4 mg (4 mg Intravenous Given 02/05/19 1133)  morphine 4 MG/ML injection 4 mg (4 mg Intravenous Given 02/05/19 1401)  iohexol (OMNIPAQUE) 350 MG/ML injection 75 mL (75 mLs Intravenous Contrast Given 02/05/19 1425)     Initial Impression / Assessment and Plan / ED Course  I have reviewed the triage vital signs and the nursing notes.  Pertinent labs & imaging results that were available during my care of the patient were reviewed by me and considered in my medical decision making (see chart for details).        Shan Fennelly was evaluated in Emergency Department on 02/05/19  for the symptoms described in the history of present illness. He/she was evaluated in the context of the global COVID-19 pandemic, which necessitated consideration that the patient might be at risk for infection with the SARS-CoV-2 virus that causes COVID-19. Institutional protocols and algorithms that pertain to the evaluation of patients at risk for COVID-19 are in a state of rapid change based on information released by regulatory bodies including the CDC and federal and state organizations. These policies and algorithms were followed during the patient's care in the ED.  37 year old female with past medical history of lupus, fibromyalgia, presents to ED for left-sided chest pain radiating to her back for the past 3 days.  She states that the pain is worse with deep inspiration, any movement, palpation and coughing.  She does have a slightly productive cough with mucus for the past 3 days.  She has not had any  improvement with her GERD medication.  She denies fevers, hemoptysis, leg swelling.  She is concerned that this could be a flareup of her fibromyalgia.  On my exam his chest is tender to palpation.  Similar area on back with no midline tenderness.  No lower extremity edema, erythema or calf tenderness are concerning for DVT.  He does have pleuritic chest pain.  She is not tachycardic, tachypneic or hypoxic.  She has a history of tobacco abuse but denies any drug use.  UDS is positive for opiates which she does take at home as needed for pain.  Chest x-ray is negative for acute abnormality.  Initial and delta troponin are both negative.  CBC, BMP unremarkable.  COVID-19 testing today is negative.  EKG shows some changes to anterior leads which have been intermittently present and EKGs in the past.  CTA of the chest shows no PE, does show small pericardial effusion related to her lupus.  She was given pain medication here with some improvement in her symptoms.  I have consulted cardiology for recommendation and will admit to IMTS.   Portions of this note were generated with Scientist, clinical (histocompatibility and immunogenetics). Dictation errors may occur despite Latorre attempts at proofreading.   Final Clinical Impressions(s) / ED Diagnoses   Final diagnoses:  Pericardial effusion    ED Discharge Orders    None       Dietrich Pates, PA-C 02/05/19 1547    Virgina Norfolk, DO 02/05/19 1621

## 2019-02-05 NOTE — ED Triage Notes (Signed)
Pt presents to the ED with left sided chest and back pain that started three days ago. Pt reports 7/10 back pain at present time. Pt reports she thought it was heartburn but she took some mylanta but it did not help. Pt reports increased chest and back pain with a deep breath. Pt denies any history of heart problems.

## 2019-02-05 NOTE — Consult Note (Signed)
Cardiology Consultation:   Patient ID: Diana EllisShunoah Huynh; [id number]; 06/17/1981   Admit date: 02/05/2019 Date of Consult: 02/05/2019  Primary Care Provider: Lorenso Courierhundi, Vahini, MD Primary Cardiologist: Dr. Elease HashimotoNahser, MD   Patient Profile:   Diana Huynh is a 38 y.o. female with a hx of lupus on hydroxychloroquine and fibromyalgia who is being seen today for the evaluation of left sided chest pain at the request of Dr. Idelle LeechKhatri.  History of Present Illness:   [name] is a 38yo F with a hx as stated above who presented to the ED on 02/05/2019 with c/o left-sided chest pain with radiation to her back that began approximately 3-days ago. She reported that the pain was intermittent at first however has gradually worsened over the course of this time. She reports that the pain is exacerbated by movement, deep inspiration and touch and she has had a mild, slightly productive cough but no fever. She initially thought that her pain was related to GERD however her symptoms did not improve with taking Mylanta. She follows with rheumatology and was recently prescribed a steroid taper for her pain as it was thought to be either a lupus or fibromyalgia flare. She reports that she was recently dx with both in 2019. She has no prior hx of CAD although has a family hx of arrhthymias in her sister who has a defibrillator. She states that when she was dx with lupus she was also dx with pericarditis and was followed by our service. She does report that she had a cold about two months ago and has since had more flares than usual although her flares are typically not this severe and do not involve chest discomfort. Otherwise, she denies LE swelling, orthopnea, PND, dizziness, palpitations or syncope.   She was last seen by our service on 01/29/2018 in follow up for pericarditis after her dx of lupus (elevated ANA) which was thought to be the cause. Echocardiogram at that time demonstrated small pericardial effusion without evidence  of tamponade. She was placed on colchicine, prednisone taper and ibuprofen. Internal medicine also placed her on hydroxychloroquine and referred her to rheumatology given her elevated ANA. Her prednisone was reduced in the OP setting and she began experiencing worsening chest discomfort.  Her prednisone dose was therefore increased. In follow up with Tereso NewcomerScott Weaver PA, she reported feeling better since increasing her prednisone dose and was only taking ibuprofen as needed.    Past Medical History:  Diagnosis Date   Bronchitis    Fibromyalgia    Lupus (HCC)    Pleuritis 12/31/2017    Past Surgical History:  Procedure Laterality Date   ANKLE SURGERY     APPENDECTOMY     CESAREAN SECTION       Prior to Admission medications   Medication Sig Start Date End Date Taking? Authorizing Provider  alum & mag hydroxide-simeth (MAALOX REGULAR STRENGTH) 200-200-20 MG/5ML suspension Take 30 mLs by mouth every 6 (six) hours as needed for indigestion or heartburn. 06/30/17  Yes Cardama, Amadeo GarnetPedro Eduardo, MD  benzonatate (TESSALON) 100 MG capsule Take 1 capsule (100 mg total) by mouth every 8 (eight) hours. 12/29/18  Yes Renne CriglerGeiple, Joshua, PA-C  cyclobenzaprine (FLEXERIL) 5 MG tablet Take 1 tablet (5 mg total) by mouth 3 (three) times daily as needed for muscle spasms. 10/21/18  Yes Lanelle BalHarbrecht, Lawrence, MD  famotidine (PEPCID) 20 MG tablet Take 20 mg by mouth daily.   Yes [provider]  gabapentin (NEURONTIN) 300 MG capsule Take 3 capsules (  900 mg total) by mouth at bedtime. Patient taking differently: Take 900 mg by mouth daily.  09/19/18  Yes Helberg, Jill Alexanders, MD  hydroxychloroquine (PLAQUENIL) 200 MG tablet Take 1 tablet (200 mg total) by mouth daily. 10/03/18  Yes Lanelle Bal, MD  ibuprofen (ADVIL,MOTRIN) 600 MG tablet Take 1 tablet (600 mg total) by mouth every 12 (twelve) hours as needed for moderate pain. 10/21/18  Yes Lanelle Bal, MD  HYDROcodone-acetaminophen (NORCO) 5-325 MG  tablet Take 1 tablet by mouth every 8 (eight) hours as needed for moderate pain. Patient not taking: Reported on 02/05/2019 12/26/18   Lorenso Courier, MD  omeprazole (PRILOSEC) 40 MG capsule Take 1 capsule (40 mg total) by mouth daily. Patient not taking: Reported on 02/05/2019 10/21/18   Lanelle Bal, MD  ondansetron (ZOFRAN ODT) 4 MG disintegrating tablet Take 1 tablet (4 mg total) by mouth every 6 (six) hours as needed. Patient not taking: Reported on 02/05/2019 01/05/18   Ward, Layla Maw, DO  ondansetron (ZOFRAN ODT) 4 MG disintegrating tablet Take 1 tablet (4 mg total) by mouth every 8 (eight) hours as needed for nausea or vomiting. Patient not taking: Reported on 02/05/2019 09/27/18   Street, [place], [place]    Inpatient Medications: Scheduled Meds:  Continuous Infusions:  PRN Meds:   Allergies:   No Known Allergies  Social History:   Social History   Socioeconomic History   Marital status: Married    Spouse name: Not on file   Number of children: Not on file   Years of education: Not on file   Highest education level: Not on file  Occupational History   Not on file  Social Needs   Financial resource strain: Not on file   Food insecurity:    Worry: Not on file    Inability: Not on file   Transportation needs:    Medical: Not on file    Non-medical: Not on file  Tobacco Use   Smoking status: Current Every Day Smoker    Packs/day: 0.25    Types: Cigarettes    Last attempt to quit: 09/20/2018    Years since quitting: 0.3   Smokeless tobacco: Never Used   Tobacco comment: 4-5 cigs/day.  Substance and Sexual Activity   Alcohol use: Yes   Drug use: No   Sexual activity: Yes  Lifestyle   Physical activity:    Days per week: Not on file    Minutes per session: Not on file   Stress: Not on file  Relationships   Social connections:    Talks on phone: Not on file    Gets together: Not on file    Attends religious service: Not on file    Active member  of club or organization: Not on file    Attends meetings of clubs or organizations: Not on file    Relationship status: Not on file   Intimate partner violence:    Fear of current or ex partner: Not on file    Emotionally abused: Not on file    Physically abused: Not on file    Forced sexual activity: Not on file  Other Topics Concern   Not on file  Social History Narrative   Not on file    Family History:   No family history on file. Family Status:  No family status information on file.   ROS:  Please see the history of present illness.  All other ROS reviewed and negative.     Physical Exam/Data:  Vitals:   02/05/19 1230 02/05/19 1300 02/05/19 1330 02/05/19 1400  BP: 138/84 129/82 111/87 (!) 141/91  Pulse: 85 82 86 89  Resp: 13 16 17 17   Temp:      TempSrc:      SpO2: 100% 100% 100% 100%  Weight:      Height:       No intake or output data in the 24 hours ending 02/05/19 1549 Filed Weights   02/05/19 1054  Weight: 74.8 kg   Body mass index is 31.18 kg/m.   General:   Young , fairly healthy appearing female,  Crying  HEENT: normal Lymph: no adenopathy Neck: no JVD Endocrine:  No thryomegaly Vascular: No carotid bruits; 4/4 extremity pulses 2+, without bruits  Cardiac:   RR , no murmur, no rub  Chest wall is very tender along the left ribs above left breast   Lungs:  clear to auscultation bilaterally, no wheezing, rhonchi or rales  Abd: soft, nontender, no hepatomegaly  Ext: no edema Musculoskeletal:  No deformities, BUE and BLE strength normal and equal Skin: warm and dry  Neuro:  CNs 2-12 intact, no focal abnormalities noted Psych:  Normal affect   EKG:  The EKG was personally reviewed and demonstrates: 02/05/2019 ST with atrial enlargement and non-specific T wave abnormalities.  Telemetry:  Telemetry was personally reviewed and demonstrates:  NSR   Relevant CV Studies:  ECHO: 12/27/2017 Study Conclusions  - Left ventricle: The cavity size  was normal. Wall thickness was   normal. Systolic function was normal. The estimated ejection   fraction was in the range of 60% to 65%. Wall motion was normal;   there were no regional wall motion abnormalities. Left   ventricular diastolic function parameters were normal. - Mitral valve: Mildly thickened leaflets . There was trivial   regurgitation. - Left atrium: The atrium was normal in size. - Pericardium, extracardiac: A small pericardial effusion was   identified. Features were not consistent with tamponade   physiology.  Impressions:  - LVEF 60-65%, normal wall thickness, normal wall motion, normal   diastolic function, trivial MR, normal LA size, small pericardial   effusion without tamponade features.  CATH: None   Laboratory Data:  Chemistry Recent Labs  Lab 02/05/19 1117  NA 137  K 4.1  CL 102  CO2 22  GLUCOSE 96  BUN 12  CREATININE 0.85  CALCIUM 9.4  GFRNONAA >60  GFRAA >60  ANIONGAP 13    Total Protein  Date Value Ref Range Status  12/29/2018 7.7 6.5 - 8.1 g/dL Final  67/34/1937 7.1 6.0 - 8.5 g/dL Final   Albumin  Date Value Ref Range Status  12/29/2018 4.1 3.5 - 5.0 g/dL Final  90/24/0973 4.4 3.5 - 5.5 g/dL Final   AST  Date Value Ref Range Status  12/29/2018 27 15 - 41 U/L Final   ALT  Date Value Ref Range Status  12/29/2018 27 0 - 44 U/L Final   Alkaline Phosphatase  Date Value Ref Range Status  12/29/2018 50 38 - 126 U/L Final   Total Bilirubin  Date Value Ref Range Status  12/29/2018 0.5 0.3 - 1.2 mg/dL Final   Bilirubin Total  Date Value Ref Range Status  01/30/2018 <0.2 0.0 - 1.2 mg/dL Final   Hematology Recent Labs  Lab 02/05/19 1117  WBC 7.0  RBC 4.68  HGB 13.1  HCT 39.1  MCV 83.5  MCH 28.0  MCHC 33.5  RDW 15.7*  PLT 262   Cardiac  Enzymes Recent Labs  Lab 02/05/19 1117 02/05/19 1353  TROPONINI <0.03 <0.03   No results for input(s): TROPIPOC in the last 168 hours.  BNPNo results for input(s): BNP, PROBNP  in the last 168 hours.  DDimer No results for input(s): DDIMER in the last 168 hours. TSH:  Lab Results  Component Value Date   TSH 1.582 12/27/2017   Lipids: Lab Results  Component Value Date   CHOL 119 12/27/2017   HDL 31 (L) 12/27/2017   LDLCALC 80 12/27/2017   TRIG 41 12/27/2017   CHOLHDL 3.8 12/27/2017   HgbA1c: Lab Results  Component Value Date   HGBA1C 5.3 12/27/2017    Radiology/Studies:  Ct Angio Chest Pe W/cm &/or Wo Cm  Addendum Date: 02/05/2019   ADDENDUM REPORT: 02/05/2019 15:18 ADDENDUM: The diagnosis of Langerhans cell histiocytosis should be considered. Electronically Signed   By: Paulina Fusi M.D.   On: 02/05/2019 15:18   Result Date: 02/05/2019 CLINICAL DATA:  Left-sided chest and back pain beginning 3 days ago. EXAM: CT ANGIOGRAPHY CHEST WITH CONTRAST TECHNIQUE: Multidetector CT imaging of the chest was performed using the standard protocol during bolus administration of intravenous contrast. Multiplanar CT image reconstructions and MIPs were obtained to evaluate the vascular anatomy. CONTRAST:  75mL OMNIPAQUE IOHEXOL 350 MG/ML SOLN COMPARISON:  Chest radiography same day. Previous chest CT a 12/29/2018 FINDINGS: Cardiovascular: Pulmonary arterial opacification is good. There are no pulmonary emboli. Borderline cardiomegaly as seen previously. Small amount of pericardial fluid not seen previously. Mediastinum/Nodes: No mass or lymphadenopathy. Lungs/Pleura: Scattered 3-4 mm nodules now visible in both lungs, not seen previously. Scattered similar-sized cavitary lesions now present in both lungs, more numerous on the right than the left, measuring on average about 6 mm in diameter. These were not present previously. No consolidation, lobar collapse or pleural effusion. Upper Abdomen: Negative Musculoskeletal: Negative Review of the MIP images confirms the above findings. IMPRESSION: No pulmonary emboli. Newly seen small pericardial effusion. This could relate to the  patient's diagnosis of lupus. New bilateral scattered small pulmonary nodules and cavitary lesions, 6 mm and smaller. See axial image 48 for Lienemann example. These are uncommon but can be a known phenomenon in lupus. Electronically Signed: By: Paulina Fusi M.D. On: 02/05/2019 15:04   Dg Chest Portable 1 View  Result Date: 02/05/2019 CLINICAL DATA:  Severe left-sided chest pain for a few days EXAM: PORTABLE CHEST 1 VIEW COMPARISON:  12/29/2018 FINDINGS: Normal heart size and mediastinal contours. No acute infiltrate or edema. No effusion or pneumothorax. No acute osseous findings. IMPRESSION: Negative chest. Electronically Signed   By: Marnee Spring M.D.   On: 02/05/2019 11:32   Assessment and Plan:   1. Presumed pericarditis/mixed fibromyalgia/lupus falre: -c/o left-sided chest pain with radiation to her back that began approximately 3-days ago. She reported that the pain was intermittent at first however has gradually worsened over the course of this time. She reports that the pain is exacerbated by movement, deep inspiration and touch and she has had a mild, slightly productive cough but no fever. -She was last seen in follow up for pericarditis on 01/29/2018 after her dx of lupus (elevated ANA) which was thought to be the cause. -Echocardiogram at that time demonstrated small pericardial effusion without evidence of tamponade. She was placed on colchicine, prednisone taper and ibuprofen.  -EKG today with atrial enlargement and nonspecific T wave abnormalities, similar to prior tracing.  -CTA of the chest shows no PE, however does show small pericardial effusion related  to her lupus.  -Low suspicion for ACS given negative trop x3 (<0.03, <0.03, <0.03) and severe point tenderness on palpation -Will repeat echocardiogram today for comparison and place on colchicine 0.6mg  and high dose Ibuprofen  TID for symptom relief   2. Lupus/fibromyalgia: -Was dx with lupus and fibromyalgia in 2019 after a  hospitalization for chest pain in which she was found to have an elevated  ANA and pericarditis.  -Follows with rheumatology in the OP setting and has been stable on hydro-chloroquine and PRN Ibuprofen  -Further management per primary team    For questions or updates, please contact [facility] Please consult [email or web address] for contact info under Cardiology/STEMI.   Raliegh Ip NP-C [facility] Pager: [phone] 02/05/2019 3:49 PM   Attending Note:   The patient was seen and examined.  Agree with assessment and plan as noted above.  Changes made to the above note as needed.  Patient seen and independently examined with  Georgie Chard. NP .   We discussed all aspects of the encounter. I agree with the assessment and plan as stated above.  1.  Chest pain :  Likely is a combination of costochondritis and possibly pericarditis.   She has had similar episodes with her Lupus flare. Will start her back on colchicine 600 bid, motrin 600 mg tid. She will get some toradol tonight  Her symptoms are not c/w ACS.    2.  Pericarditis:  Has small pericardial effusion.  No rub on exam.  Will treat her for pericarditis and see if she improves.    I have spent a total of 40 minutes with patient reviewing hospital  notes , telemetry, EKGs, labs and examining patient as well as establishing an assessment and plan that was discussed with the patient. > 50% of time was spent in direct patient care.    Vesta Mixer, Montez Hageman., MD, [facility] 02/05/2019, 5:16 PM [address],  [address] Office [phone] Pager [phone]

## 2019-02-05 NOTE — H&P (Addendum)
Date: 02/05/2019               Patient Name:  Diana Huynh MRN: [id number]  DOB: 1981-01-02 Age / Sex: 38 y.o., female   PCP: Diana Mage, MD         Medical Service: Internal Medicine Teaching Service         Attending Physician: Diana Huynh, Diana Opitz, MD    First Contact: Dr. Annie Huynh Pager: [phone]  Second Contact: Dr. Shan Huynh Pager: [phone]       After Hours (After 5p/  First Contact Pager: [phone]  weekends / holidays): Second Contact Pager: [phone]   Chief Complaint: chest pain  History of Present Illness: Diana Huynh is a 38 yo female with a medical history of SLE on plaquenil and fibromyalgia who presented with three days of left-sided chest pain. She describes the pain as a burning, pressure sensation that radiates from her left breast into her back. She initially thought the pain was heartburn because it began after eating bbq, but it was unrelieved by maalox, omeprazole, and pepcid. The pain worsens with breathing, lying down, standing up straight, bending down. Nothing relieves her pain, including her muscle flexeril or ibuprofen. This pain feels similar to when she had pericarditis in 11/2017. She was first diagnosed with SLE after that hospitalization. She endorses headache, diffuse body pain, poor appetite, weakness, and fatigue. She is unsure whether her symptoms are from a lupus flare or a fibromyalgia flare. She does tai-chi, but denies strenuous activity or trauma to the chest.   She had telehealth visit with her [facility] rheumatologist on 4/16 and was diagnosed with a lupus flare secondary to viral infection. She had 2 weeks of URI symptoms prior to getting polyarthralgias and weakness. Her dsDNA Ab was elevated to 254. She was prescribed a short course of prednisone which ended five days ago. She states the prednisone helped some, but her symptoms have continued to wax and wane. Her lupus flares almost monthly and she has required many prednisone courses since her  initial diagnosis about a year ago. Her flares involve diffuse body pain and fatigue, but no rashes.  Upon arrival to the ED, patient was afebrile and hypertensive in the 505L systolic. CBC and BMP wnl. Troponin negative. UDS positive for opiates (recent short course of oxycodone from PCP) and THC. EKG without diffuse ST elevations, no changes from prior. CXR without edema or opacities. CTA showed pericardial effusion and new bilateral scattered small pulmonary nodules and cavitary lesions. She received morphine and toradol for pain in the ED.  Meds:  Current Meds  Medication Sig  . alum & mag hydroxide-simeth (MAALOX REGULAR STRENGTH) 200-200-20 MG/5ML suspension Take 30 mLs by mouth every 6 (six) hours as needed for indigestion or heartburn.  . benzonatate (TESSALON) 100 MG capsule Take 1 capsule (100 mg total) by mouth every 8 (eight) hours.  . cyclobenzaprine (FLEXERIL) 5 MG tablet Take 1 tablet (5 mg total) by mouth 3 (three) times daily as needed for muscle spasms.  . famotidine (PEPCID) 20 MG tablet Take 20 mg by mouth daily.  Marland Kitchen gabapentin (NEURONTIN) 300 MG capsule Take 3 capsules (900 mg total) by mouth at bedtime. (Patient taking differently: Take 900 mg by mouth daily. )  . hydroxychloroquine (PLAQUENIL) 200 MG tablet Take 1 tablet (200 mg total) by mouth daily.  Marland Kitchen ibuprofen (ADVIL,MOTRIN) 600 MG tablet Take 1 tablet (600 mg total) by mouth every 12 (twelve) hours as needed for moderate pain.  Allergies: Allergies as of 02/05/2019  . (No Known Allergies)   Past Medical History:  Diagnosis Date  . Bronchitis   . Fibromyalgia   . Lupus ([place])   . Pleuritis 12/31/2017   Past Surgical History:  Procedure Laterality Date  . ANKLE SURGERY    . APPENDECTOMY    . CESAREAN SECTION      Family History: No family hx of lupus. Cousin has MS.  Social History: Lives with husband and her two teenage children. Smokes 6 cigarettes per day. Infrequent etoh. Infrequent marijuana use, no  other illicit drugs.   Review of Systems: A complete ROS was negative except as per HPI.   Physical Exam: Blood pressure 131/77, pulse 85, temperature 98.1 F (36.7 C), temperature source Oral, resp. rate 14, height '5\' 1"'$  (1.549 m), weight 74.8 kg, SpO2 100 %.  Constitutional: Huynh and oriented, no distress. Neuro: Pupils are equal, round, and reactive to light. EOM are normal. 5/5 strength throughout.  HEENT: Bilateral tender cervical lymphadenopathy R>L Cardiovascular: TTP over her sternum and left upper chest. Normal rate and regular rhythm. No murmurs, rubs, or gallops. Pulmonary/Chest: Effort normal. Clear to auscultation bilaterally. No wheezes, rales, or rhonchi. Abdominal: Bowel sounds present. Soft, non-distended, non-tender. Ext: No lower extremity edema. Skin: Warm and dry. No rashes or wounds.  EKG: personally reviewed my interpretation is sinus tachycardia without diffuse ST elevations.   CXR: personally reviewed my interpretation is no edema or opacities.  CTA chest - No pulmonary emboli. - Newly seen small pericardial effusion. This could relate to the patient's diagnosis of lupus. - New bilateral scattered small pulmonary nodules and cavitary lesions, 6 mm and smaller. See axial image 48 for Diana Huynh example. These are uncommon but can be a known phenomenon in lupus.  Assessment & Plan by Problem: Active Problems:   Precordial chest pain  [name] is a 38 yo female with a medical history of SLE on plaquenil and fibromyalgia who presented with left chest pain and diffuse body pain.   Atypical chest pain: DDx includes pericarditis 2/2 SLE, costochondritis, and fibromyalgia. Chest pain is reproducible on exam with exquisite tenderness. Small pericardial effusion present on chest CT. No diffuse ST elevations on EKG. No pericardial rub on exam. Pt has history of pericarditis related to lupus. Patient is hemodynamically stable with no physical exam signs of tamponade from  pericardial effusion. No suspicion for ACS given EKG and negative troponin.  - Cards following, appreciate recs - Echo - Tele - Colchicine 0.'6mg'$  daily - Ibuprofen '600mg'$  TID  SLE: Unclear whether patient's diffuse body pain is from her SLE or fibromyalgia. She recently had a lupus flare after a viral illness with elevated dsDNA, requiring a prednisone taper that ended five days ago. She has frequent flares. No renal dysfunction or hematologic abnormalities.  - Continue home plaquenil '200mg'$  daily - dsDNA antibody, CRP, ESR, C3, C4 - Consider initiating prednisone based on labs - UA  Fibromyalgia - Continue home gabapentin '900mg'$  daily - Holding home flexeril  Multifocal pulmonary nodules and cavitary lesions: Seen on CT. Patient endorses dyspnea due to pleuritic chest pain. At baseline, she gets dyspneic with activity. Likely 2/2 SLE. No suspicion for infectious etiology like TB.  - Outpatient f/u with rheumatology for further evaluation  GERD: continue home protonix   FEN: LR at 165m/hr, regular diet, replace electrolytes as needed  DVT ppx: Lovenox Code status: FULL code  Dispo: Admit patient to Inpatient with expected length of stay greater than 2 midnights.  Signed: Corinne Ports, MD 02/05/2019, 5:40 PM  Pager: [phone]

## 2019-02-05 NOTE — ED Notes (Signed)
ED TO INPATIENT HANDOFF REPORT  ED Nurse Name and Phone #: Randi Poullard [phone]  S Name/Age/Gender Diana Huynh 38 y.o. female Room/Bed: 030C/030C  Code Status   Code Status: Prior  Home/SNF/Other Home Patient oriented to: self, place, time and situation Is this baseline? Yes   Triage Complete: Triage complete  Chief Complaint cp  Triage Note Pt presents to the ED with left sided chest and back pain that started three days ago. Pt reports 7/10 back pain at present time. Pt reports she thought it was heartburn but she took some mylanta but it did not help. Pt reports increased chest and back pain with a deep breath. Pt denies any history of heart problems.    Allergies No Known Allergies  Level of Care/Admitting Diagnosis ED Disposition    ED Disposition Condition Comment   Admit  The patient appears reasonably stabilized for admission considering the current resources, flow, and capabilities available in the ED at this time, and I doubt any other [facility] requiring further screening and/or treatment in the ED prior to admission is  present.       B Medical/Surgery History Past Medical History:  Diagnosis Date  . Bronchitis   . Fibromyalgia   . Lupus (HCC)   . Pleuritis 12/31/2017   Past Surgical History:  Procedure Laterality Date  . ANKLE SURGERY    . APPENDECTOMY    . CESAREAN SECTION       A IV Location/Drains/Wounds Patient Lines/Drains/Airways Status   Active Line/Drains/Airways    Name:   Placement date:   Placement time:   Site:   Days:   Peripheral IV 02/05/19 Right Antecubital   02/05/19    1253    Antecubital   less than 1          Intake/Output Last 24 hours No intake or output data in the 24 hours ending 02/05/19 1531  Labs/Imaging Results for orders placed or performed during the hospital encounter of 02/05/19 (from the past 48 hour(s))  Basic metabolic panel     Status: None   Collection Time: 02/05/19 11:17 AM  Result Value Ref Range    Sodium 137 135 - 145 mmol/L   Potassium 4.1 3.5 - 5.1 mmol/L   Chloride 102 98 - 111 mmol/L   CO2 22 22 - 32 mmol/L   Glucose, Bld 96 70 - 99 mg/dL   BUN 12 6 - 20 mg/dL   Creatinine, Ser 9.89 0.44 - 1.00 mg/dL   Calcium 9.4 8.9 - 21.1 mg/dL   GFR calc non Af Amer >60 >60 mL/min   GFR calc Af Amer >60 >60 mL/min   Anion gap 13 5 - 15    Comment: Performed at [facility], [address] [place], [place] [address]  CBC with Differential     Status: Abnormal   Collection Time: 02/05/19 11:17 AM  Result Value Ref Range   WBC 7.0 4.0 - 10.5 K/uL   RBC 4.68 3.87 - 5.11 MIL/uL   Hemoglobin 13.1 12.0 - 15.0 g/dL   HCT 08.1 44.8 - 18.5 %   MCV 83.5 80.0 - 100.0 fL   MCH 28.0 26.0 - 34.0 pg   MCHC 33.5 30.0 - 36.0 g/dL   RDW 63.1 (H) 49.7 - 02.6 %   Platelets 262 150 - 400 K/uL   nRBC 0.0 0.0 - 0.2 %   Neutrophils Relative % 69 %   Neutro Abs 4.8 1.7 - 7.7 K/uL   Lymphocytes  Relative 22 %   Lymphs Abs 1.5 0.7 - 4.0 K/uL   Monocytes Relative 8 %   Monocytes Absolute 0.6 0.1 - 1.0 K/uL   Eosinophils Relative 1 %   Eosinophils Absolute 0.1 0.0 - 0.5 K/uL   Basophils Relative 0 %   Basophils Absolute 0.0 0.0 - 0.1 K/uL   Immature Granulocytes 0 %   Abs Immature Granulocytes 0.01 0.00 - 0.07 K/uL    Comment: Performed at [facility], [address] [place], [place] [address]  Troponin I - Once     Status: None   Collection Time: 02/05/19 11:17 AM  Result Value Ref Range   Troponin I <0.03 <0.03 ng/mL    Comment: Performed at [facility], [address], [place], [place] [address]  I-Stat beta hCG blood, ED     Status: None   Collection Time: 02/05/19 11:29 AM  Result Value Ref Range   I-stat hCG, quantitative <5.0 <5 mIU/mL   Comment 3            Comment:   GEST. AGE      CONC.  (mIU/mL)   <=1 WEEK        5 - 50     2 WEEKS       50 - 500     3 WEEKS       100 - 10,000     4 WEEKS     1,000 - 30,000        FEMALE AND NON-PREGNANT FEMALE:     LESS THAN 5  mIU/mL   SARS Coronavirus 2 (CEPHEID- Performed in [facility] hospital lab), Hosp Order     Status: None   Collection Time: 02/05/19 11:37 AM  Result Value Ref Range   SARS Coronavirus 2 NEGATIVE NEGATIVE    Comment: (NOTE) If result is NEGATIVE SARS-CoV-2 target nucleic acids are NOT DETECTED. The SARS-CoV-2 RNA is generally detectable in upper and lower  respiratory specimens during the acute phase of infection. The lowest  concentration of SARS-CoV-2 viral copies this assay can detect is 250  copies / mL. A negative result does not preclude SARS-CoV-2 infection  and should not be used as the sole basis for treatment or other  patient management decisions.  A negative result may occur with  improper specimen collection / handling, submission of specimen other  than nasopharyngeal swab, presence of viral mutation(s) within the  areas targeted by this assay, and inadequate number of viral copies  (<250 copies / mL). A negative result must be combined with clinical  observations, patient history, and epidemiological information. If result is POSITIVE SARS-CoV-2 target nucleic acids are DETECTED. The SARS-CoV-2 RNA is generally detectable in upper and lower  respiratory specimens dur ing the acute phase of infection.  Positive  results are indicative of active infection with SARS-CoV-2.  Clinical  correlation with patient history and other diagnostic information is  necessary to determine patient infection status.  Positive results do  not rule out bacterial infection or co-infection with other viruses. If result is PRESUMPTIVE POSTIVE SARS-CoV-2 nucleic acids MAY BE PRESENT.   A presumptive positive result was obtained on the submitted specimen  and confirmed on repeat testing.  While 2019 novel coronavirus  (SARS-CoV-2) nucleic acids may be present in the submitted sample  additional confirmatory testing may be necessary for epidemiological  and / or clinical management purposes   to differentiate between  SARS-CoV-2 and other Sarbecovirus currently known to  infect humans.  If clinically indicated additional testing with an alternate test  methodology [phone]([id number]) is advised. The SARS-CoV-2 RNA is generally  detectable in upper and lower respiratory sp ecimens during the acute  phase of infection. The expected result is Negative. Fact Sheet for Patients:  BoilerBrush.com.cyhttps://www.fda.gov/media/136312/download Fact Sheet for Healthcare Providers: https://pope.com/https://www.fda.gov/media/136313/download This test is not yet approved or cleared by the [place] FDA and has been authorized for detection and/or diagnosis of SARS-CoV-2 by FDA under an Emergency Use Authorization (EUA).  This EUA will remain in effect (meaning this test can be used) for the duration of the COVID-19 declaration under Section 564(b)(1) of the Act, 21 U.S.C. section 360bbb-3(b)(1), unless the authorization is terminated or revoked sooner. Performed at [facility], [address] [place], [place] [address]   Rapid urine drug screen (hospital performed)     Status: Abnormal   Collection Time: 02/05/19 12:27 PM  Result Value Ref Range   Opiates POSITIVE (A) NONE DETECTED   Cocaine NONE DETECTED NONE DETECTED   Benzodiazepines NONE DETECTED NONE DETECTED   Amphetamines NONE DETECTED NONE DETECTED   Tetrahydrocannabinol POSITIVE (A) NONE DETECTED   Barbiturates NONE DETECTED NONE DETECTED    Comment: (NOTE) DRUG SCREEN FOR MEDICAL PURPOSES ONLY.  IF CONFIRMATION IS NEEDED FOR ANY PURPOSE, NOTIFY LAB WITHIN 5 DAYS. LOWEST DETECTABLE LIMITS FOR URINE DRUG SCREEN Drug Class                     Cutoff (ng/mL) Amphetamine and metabolites    1000 Barbiturate and metabolites    200 Benzodiazepine                 200 Tricyclics and metabolites     300 Opiates and metabolites        300 Cocaine and metabolites        300 THC                            50 Performed at [facility], [address] [place], [place] [address]   Troponin I - ONCE - STAT     Status: None   Collection Time: 02/05/19  1:53 PM  Result Value Ref Range   Troponin I <0.03 <0.03 ng/mL    Comment: Performed at [facility], [address] [place], [place] [address]   Ct Angio Chest Pe W/cm &/or Wo Cm  Addendum Date: 02/05/2019   ADDENDUM REPORT: 02/05/2019 15:18 ADDENDUM: The diagnosis of Langerhans cell histiocytosis should be considered. Electronically Signed   By: Paulina FusiMark  Shogry M.D.   On: 02/05/2019 15:18   Result Date: 02/05/2019 CLINICAL DATA:  Left-sided chest and back pain beginning 3 days ago. EXAM: CT ANGIOGRAPHY CHEST WITH CONTRAST TECHNIQUE: Multidetector CT imaging of the chest was performed using the standard protocol during bolus administration of intravenous contrast. Multiplanar CT image reconstructions and MIPs were obtained to evaluate the vascular anatomy. CONTRAST:  75mL OMNIPAQUE IOHEXOL 350 MG/ML SOLN COMPARISON:  Chest radiography same day. Previous chest CT a 12/29/2018 FINDINGS: Cardiovascular: Pulmonary arterial opacification is good. There are no pulmonary emboli. Borderline cardiomegaly as seen previously. Small amount of pericardial fluid not seen previously. Mediastinum/Nodes: No mass or lymphadenopathy. Lungs/Pleura: Scattered 3-4 mm nodules now visible in both lungs, not seen previously. Scattered similar-sized cavitary lesions now present in both lungs, more numerous on the right than the left, measuring on average about 6 mm  in diameter. These were not present previously. No consolidation, lobar collapse or pleural effusion. Upper Abdomen: Negative Musculoskeletal: Negative Review of the MIP images confirms the above findings. IMPRESSION: No pulmonary emboli. Newly seen small pericardial effusion. This could relate to the patient's diagnosis of lupus. New bilateral scattered small pulmonary nodules and cavitary lesions, 6 mm and smaller. See axial image 48 for [place] example. These are  uncommon but can be a known phenomenon in lupus. Electronically Signed: By: Paulina Fusi M.D. On: 02/05/2019 15:04   Dg Chest Portable 1 View  Result Date: 02/05/2019 CLINICAL DATA:  Severe left-sided chest pain for a few days EXAM: PORTABLE CHEST 1 VIEW COMPARISON:  12/29/2018 FINDINGS: Normal heart size and mediastinal contours. No acute infiltrate or edema. No effusion or pneumothorax. No acute osseous findings. IMPRESSION: Negative chest. Electronically Signed   By: Marnee Spring M.D.   On: 02/05/2019 11:32    Pending Labs Unresulted Labs (From admission, onward)   None      Vitals/Pain Today's Vitals   02/05/19 1300 02/05/19 1330 02/05/19 1348 02/05/19 1400  BP: 129/82 111/87  (!) 141/91  Pulse: 82 86  89  Resp: Temp:      TempSrc:      SpO2: 100% 100%  100%  Weight:      Height:      PainSc:   7      Isolation Precautions No active isolations  Medications Medications  morphine 4 MG/ML injection 4 mg (4 mg Intravenous Given 02/05/19 1133)  morphine 4 MG/ML injection 4 mg (4 mg Intravenous Given 02/05/19 1401)  iohexol (OMNIPAQUE) 350 MG/ML injection 75 mL (75 mLs Intravenous Contrast Given 02/05/19 1425)    Mobility walks Low fall risk   Focused Assessments Cardiac Assessment Handoff:  Cardiac Rhythm: Normal sinus rhythm Lab Results  Component Value Date   CKTOTAL 146 09/19/2018   TROPONINI <0.03 02/05/2019   Lab Results  Component Value Date   DDIMER 0.73 (H) 12/29/2018   Does the Patient currently have chest pain? Yes     R Recommendations: See Admitting Provider Note  Report given to:   Additional Notes:

## 2019-02-05 NOTE — ED Notes (Signed)
Patient transported to CT 

## 2019-02-05 NOTE — Telephone Encounter (Signed)
Call from pt - c/o left side breast pain, radiating to left upper back and left arm. Stated started 2-3 days ago;she thought it was heartburn, took her medication w/o relief. Also stated pain is worse when she stands up. Informed to go to the ER; stated she wanted to be seen here first.  Discussed w/Dr Cyndie Chime, the attending - suggested pt go to the ER.  Informed pt to go to ER or call 911. I asked if she's alone; stated her husband will be able to take her to the ER.

## 2019-02-05 NOTE — Telephone Encounter (Signed)
F/U - Called pt's home# - no answer; called husband's cell - she has not arrived at the ER but stated she's on her way.

## 2019-02-05 NOTE — ED Notes (Signed)
Spoke to pts husband, she does not have  Her cell phone , husband will try and bring to ER, explained that we were starting iv giving her pain meds and doing xrays, pt states he will call when he is here with her phone , I told him we will keep him updated

## 2019-02-06 ENCOUNTER — Inpatient Hospital Stay (HOSPITAL_COMMUNITY): Payer: Medicaid Other

## 2019-02-06 DIAGNOSIS — I313 Pericardial effusion (noninflammatory): Secondary | ICD-10-CM

## 2019-02-06 DIAGNOSIS — R072 Precordial pain: Secondary | ICD-10-CM

## 2019-02-06 LAB — COMPREHENSIVE METABOLIC PANEL
ALT: 16 U/L (ref 0–44)
AST: 14 U/L — ABNORMAL LOW (ref 15–41)
Albumin: 3.6 g/dL (ref 3.5–5.0)
Alkaline Phosphatase: 49 U/L (ref 38–126)
Anion gap: 9 (ref 5–15)
BUN: 17 mg/dL (ref 6–20)
CO2: 22 mmol/L (ref 22–32)
Calcium: 8.9 mg/dL (ref 8.9–10.3)
Chloride: 104 mmol/L (ref 98–111)
Creatinine, Ser: 0.9 mg/dL (ref 0.44–1.00)
GFR calc Af Amer: >60 mL/min (ref >60)
GFR calc non Af Amer: >60 mL/min (ref >60)
Glucose, Bld: 102 mg/dL — ABNORMAL HIGH (ref 70–99)
Potassium: 4.1 mmol/L (ref 3.5–5.1)
Sodium: 135 mmol/L (ref 135–145)
Total Bilirubin: 0.7 mg/dL (ref 0.3–1.2)
Total Protein: 7.3 g/dL (ref 6.5–8.1)

## 2019-02-06 LAB — URINALYSIS, ROUTINE W REFLEX MICROSCOPIC
Bilirubin Urine: NEGATIVE
Glucose, UA: NEGATIVE mg/dL
Hgb urine dipstick: NEGATIVE
Ketones, ur: NEGATIVE mg/dL
Leukocytes,Ua: NEGATIVE
Nitrite: NEGATIVE
Protein, ur: NEGATIVE mg/dL
Specific Gravity, Urine: 1.01 (ref 1.005–1.030)
pH: 7 (ref 5.0–8.0)

## 2019-02-06 LAB — HIV ANTIBODY (ROUTINE TESTING W REFLEX): HIV Screen 4th Generation wRfx: NONREACTIVE

## 2019-02-06 LAB — CBC
HCT: 35.8 % — ABNORMAL LOW (ref 36.0–46.0)
Hemoglobin: 12 g/dL (ref 12.0–15.0)
MCH: 27.8 pg (ref 26.0–34.0)
MCHC: 33.5 g/dL (ref 30.0–36.0)
MCV: 82.9 fL (ref 80.0–100.0)
Platelets: 247 10*3/uL (ref 150–400)
RBC: 4.32 MIL/uL (ref 3.87–5.11)
RDW: 15.4 % (ref 11.5–15.5)
WBC: 7.2 10*3/uL (ref 4.0–10.5)
nRBC: 0 % (ref 0.0–0.2)

## 2019-02-06 LAB — ECHOCARDIOGRAM LIMITED
Height: 61 in
Weight: 2665.6 oz

## 2019-02-06 MED ORDER — GABAPENTIN 300 MG PO CAPS
900.0000 mg | ORAL_CAPSULE | Freq: Every day | ORAL | Status: DC
  Administered 2019-02-06 – 2019-02-08 (×3): 900 mg via ORAL
  Filled 2019-02-06 (×4): qty 3

## 2019-02-06 MED ORDER — GABAPENTIN 600 MG PO TABS
300.0000 mg | ORAL_TABLET | Freq: Once | ORAL | Status: AC
  Administered 2019-02-06: 19:00:00 300 mg via ORAL
  Filled 2019-02-06: qty 1

## 2019-02-06 MED ORDER — IBUPROFEN 600 MG PO TABS: 600.0000 mg | ORAL_TABLET | Freq: Three times a day (TID) | ORAL | Status: DC

## 2019-02-06 MED ORDER — PREDNISONE 20 MG PO TABS
20.0000 mg | ORAL_TABLET | Freq: Every day | ORAL | Status: AC
  Administered 2019-02-06 – 2019-02-08 (×3): 20 mg via ORAL
  Filled 2019-02-06 (×3): qty 1

## 2019-02-06 MED ORDER — PANTOPRAZOLE SODIUM 40 MG PO TBEC
40.0000 mg | DELAYED_RELEASE_TABLET | Freq: Two times a day (BID) | ORAL | Status: DC
  Administered 2019-02-06 – 2019-02-08 (×4): 40 mg via ORAL
  Filled 2019-02-06 (×4): qty 1

## 2019-02-06 MED ORDER — CYCLOBENZAPRINE HCL 10 MG PO TABS
5.0000 mg | ORAL_TABLET | Freq: Three times a day (TID) | ORAL | Status: DC | PRN
  Administered 2019-02-06 – 2019-02-07 (×4): 5 mg via ORAL
  Filled 2019-02-06 (×4): qty 1

## 2019-02-06 MED ORDER — KETOROLAC TROMETHAMINE 30 MG/ML IJ SOLN
10.0000 mg | Freq: Once | INTRAMUSCULAR | Status: AC
  Administered 2019-02-06: 07:00:00 9.9 mg via INTRAVENOUS
  Filled 2019-02-06: qty 1

## 2019-02-06 MED ORDER — DICLOFENAC SODIUM 1 % TD GEL
2.0000 g | Freq: Four times a day (QID) | TRANSDERMAL | Status: DC | PRN
  Administered 2019-02-07 – 2019-02-08 (×3): 2 g via TOPICAL
  Filled 2019-02-06: qty 100

## 2019-02-06 MED ORDER — IBUPROFEN 600 MG PO TABS
600.0000 mg | ORAL_TABLET | Freq: Three times a day (TID) | ORAL | Status: DC
  Administered 2019-02-06 – 2019-02-08 (×7): 600 mg via ORAL
  Filled 2019-02-06 (×7): qty 1

## 2019-02-06 NOTE — Progress Notes (Signed)
Tearful pt was c/o muscle pain on her back, abdomen and legs and stated that she would typically take a muscle relaxer for this type of pain at home. RN paged MD asking for muscle relaxer, upon giving med to pt she stated that the flexeril will not be effective. MD paged to bedside, gabapentin & heat pack given. Will continue to monitor.

## 2019-02-06 NOTE — Progress Notes (Signed)
Progress Note  Patient Name: Diana Huynh Date of Encounter: 02/06/2019  Primary Cardiologist: Kristeen Miss, MD   Subjective   Still having some cp, chest wall pain  Troponin s are negative Echo was just done    Inpatient Medications    Scheduled Meds:  colchicine  0.6 mg Oral Daily   enoxaparin (LOVENOX) injection  40 mg Subcutaneous Q24H   gabapentin  900 mg Oral Daily   hydroxychloroquine  200 mg Oral Daily   ibuprofen  600 mg Oral TID   pantoprazole  40 mg Oral Daily   Continuous Infusions:  PRN Meds: diclofenac sodium   Vital Signs    Vitals:   02/05/19 1757 02/05/19 2000 02/05/19 2039 02/06/19 0429  BP: 126/89  124/77 116/82  Pulse:  80 82 78  Resp:  16 17 12   Temp: 97.7 F (36.5 C)  97.9 F (36.6 C) 98.2 F (36.8 C)  TempSrc: Oral  Oral Oral  SpO2:  100% 100% 100%  Weight: 74.6 kg   75.6 kg  Height: 5\' 1"  (1.549 m)       Intake/Output Summary (Last 24 hours) at 02/06/2019 1026 Last data filed at 02/06/2019 0600 Gross per 24 hour  Intake 1300.28 ml  Output 300 ml  Net 1000.28 ml   Last 3 Weights 02/06/2019 02/05/2019 02/05/2019  Weight (lbs) 166 lb 9.6 oz 164 lb 6.4 oz 165 lb  Weight (kg) 75.569 kg 74.571 kg 74.844 kg      Telemetry    NSR  - Personally Reviewed  ECG     NSR  - Personally Reviewed  Physical Exam   GEN: young female,  Still has mild - mod distress  Neck: No JVD Cardiac: RRR, no murmurs, no  rubs,   Respiratory: Clear to auscultation bilaterally. GI: Soft, nontender, non-distended  MS: No edema; No deformity. Neuro:  Nonfocal  Psych: Normal affect   Labs    Chemistry Recent Labs  Lab 02/05/19 1117 02/05/19 1929 02/06/19 0231  NA 137  --  135  K 4.1  --  4.1  CL 102  --  104  CO2 22  --  22  GLUCOSE 96  --  102*  BUN 12  --  17  CREATININE 0.85  --  0.90  CALCIUM 9.4  --  8.9  PROT  --  8.1 7.3  ALBUMIN  --  4.0 3.6  AST  --  17 14*  ALT  --  19 16  ALKPHOS  --  51 49  BILITOT  --  0.5 0.7  GFRNONAA  >60  --  >60  GFRAA >60  --  >60  ANIONGAP 13  --  9     Hematology Recent Labs  Lab 02/05/19 1117 02/06/19 0231  WBC 7.0 7.2  RBC 4.68 4.32  HGB 13.1 12.0  HCT 39.1 35.8*  MCV 83.5 82.9  MCH 28.0 27.8  MCHC 33.5 33.5  RDW 15.7* 15.4  PLT 262 247    Cardiac Enzymes Recent Labs  Lab 02/05/19 1117 02/05/19 1353  TROPONINI <0.03 <0.03   No results for input(s): TROPIPOC in the last 168 hours.   BNPNo results for input(s): BNP, PROBNP in the last 168 hours.   DDimer No results for input(s): DDIMER in the last 168 hours.   Radiology    Ct Angio Chest Pe W/cm &/or Wo Cm  Addendum Date: 02/05/2019   ADDENDUM REPORT: 02/05/2019 15:18 ADDENDUM: The diagnosis of Langerhans cell histiocytosis should be considered.  Electronically Signed   By: Paulina FusiMark  Shogry M.D.   On: 02/05/2019 15:18   Result Date: 02/05/2019 CLINICAL DATA:  Left-sided chest and back pain beginning 3 days ago. EXAM: CT ANGIOGRAPHY CHEST WITH CONTRAST TECHNIQUE: Multidetector CT imaging of the chest was performed using the standard protocol during bolus administration of intravenous contrast. Multiplanar CT image reconstructions and MIPs were obtained to evaluate the vascular anatomy. CONTRAST:  75mL OMNIPAQUE IOHEXOL 350 MG/ML SOLN COMPARISON:  Chest radiography same day. Previous chest CT a 12/29/2018 FINDINGS: Cardiovascular: Pulmonary arterial opacification is good. There are no pulmonary emboli. Borderline cardiomegaly as seen previously. Small amount of pericardial fluid not seen previously. Mediastinum/Nodes: No mass or lymphadenopathy. Lungs/Pleura: Scattered 3-4 mm nodules now visible in both lungs, not seen previously. Scattered similar-sized cavitary lesions now present in both lungs, more numerous on the right than the left, measuring on average about 6 mm in diameter. These were not present previously. No consolidation, lobar collapse or pleural effusion. Upper Abdomen: Negative Musculoskeletal: Negative Review of  the MIP images confirms the above findings. IMPRESSION: No pulmonary emboli. Newly seen small pericardial effusion. This could relate to the patient's diagnosis of lupus. New bilateral scattered small pulmonary nodules and cavitary lesions, 6 mm and smaller. See axial image 48 for [place] example. These are uncommon but can be a known phenomenon in lupus. Electronically Signed: By: Paulina FusiMark  Shogry M.D. On: 02/05/2019 15:04   Dg Chest Portable 1 View  Result Date: 02/05/2019 CLINICAL DATA:  Severe left-sided chest pain for a few days EXAM: PORTABLE CHEST 1 VIEW COMPARISON:  12/29/2018 FINDINGS: Normal heart size and mediastinal contours. No acute infiltrate or edema. No effusion or pneumothorax. No acute osseous findings. IMPRESSION: Negative chest. Electronically Signed   By: Marnee SpringJonathon  Watts M.D.   On: 02/05/2019 11:32    Cardiac Studies      Patient Profile     38 y.o. female with lupus, hx of pericarditis.   Presents with chest wall pain and pleuretic CP   Assessment & Plan    1  Possible pericarditis:   Cont. Colchicine for now,  Ibuprofen. Echo has been done,  Not loaded in system yet   2.   Costochondritis:   Plans per IM   3.  Lupus:  Plans per IM         For questions or updates, please contact [facility] Please consult [email or web address] for contact info under        Signed, Kristeen MissPhilip Ailana Cuadrado, MD  02/06/2019, 10:26 AM

## 2019-02-06 NOTE — Progress Notes (Signed)
Patient examined bedside after RN expressed concerns about continued chest pain. She states that the left-sided chest pain she is currently experienced is the same as what she has had since admission. She was given Toradol 15 mg IV and Ibuprofen 600 mg PO last night. She reports that she did have some relief with the Toradol and was able to go to sleep. She woke this morning with the left-sided chest pain, right shoulder pain, and bilateral lower extremity pain. She was given another dose of Ibuprofen 600 mg PO but does not believe that this has helped her pain. She reports associated stiffness (usually has 1-2 hours of stiffness at home after waking). Tele reviewed and was unremarkable. Her physical exam was significant for tenderness to palpation of left-side chest wall. Respiratory and cardiovascular exam unremarkable.   Plan: I do believe that her chest pain is still likely due to a combination of costochondritis and possibly pericarditis as her symptoms did improve with NSAID use overnight.  1. Will move up gabapentin dose to be given now 2. Give Toradol 10 mg once

## 2019-02-06 NOTE — Progress Notes (Signed)
   Subjective: Pt continued to have chest pain overnight, unchanged from admission, requiring two additional doses of toradol. This morning, she feels a little better after receiving the toradol and gabapentin, however she continues to have pain in the chest and diffusely throughout the body. Her appetite is better today. All questions addressed.   Objective:  Vital signs in last 24 hours: Vitals:   02/05/19 1757 02/05/19 2000 02/05/19 2039 02/06/19 0429  BP: 126/89  124/77 116/82  Pulse:  80 82 78  Resp:  '16 17 12  '$ Temp: 97.7 F (36.5 C)  97.9 F (36.6 C) 98.2 F (36.8 C)  TempSrc: Oral  Oral Oral  SpO2:  100% 100% 100%  Weight: 74.6 kg   75.6 kg  Height: '5\' 1"'$  (1.549 m)      Gen: alert and oriented, no distress HEENT: bilateral cervical lymphadenopathy, right upper shoulder soft tissue swelling (possible cyst rather than lymph node), no axillary lymphadenopathy CV: RRR, no murmurs or rubs Pulm: CTAB, normal effort on room air Abd: bs+, soft, non-distended, non-tender Ext: soft tissue swelling of the fingers   Assessment/Plan:  Active Problems:   Precordial chest pain  Ms. Lucking is a 38 yo female with a medical history of SLE on plaquenil and fibromyalgia who presented with left chest pain and diffuse body pain.   Atypical chest pain: DDx includes pericarditis 2/2 SLE, costochondritis, and fibromyalgia. Patient is hemodynamically stable with no physical exam signs of tamponade from pericardial effusion. Her pain has not improved and has required high dose NSAIDs for control. - Cards following, appreciate recs - F/u Echo - Colchicine 0.'6mg'$  daily - Ibuprofen '600mg'$  TID - Voltaren gel  SLE: Unclear whether patient's diffuse body pain is from her SLE or fibromyalgia. She has frequent flares. No renal dysfunction or hematologic abnormalities. CRP wnl. ESR mildly elevated at 24. I spoke with Dr. Alexandria Lodge with [facility] rheumatology. Her colleague follows Ms. Speegle. She recommended  initiating a prednisone taper. She will arrange outpatient f/u in their clinic in 1-2 weeks.  - Continue home plaquenil '200mg'$  daily - F/u dsDNA antibody, C3, C4 - Prednisone taper ('20mg'$  x3 days, '15mg'$  x3 days, '10mg'$  x3 days, '5mg'$  x3days)  Fibromyalgia - Continue home gabapentin '900mg'$  daily - Holding home flexeril  Multifocal pulmonary nodules and cavitary lesions: Likely 2/2 SLE. - Outpatient f/u with rheumatology for further evaluation  Dispo: Anticipated discharge in approximately 1-2 days  Dorrell, Andree Elk, MD 02/06/2019, 7:10 AM Pager: [phone]

## 2019-02-06 NOTE — Progress Notes (Signed)
  Echocardiogram 2D Echocardiogram has been performed.  Celene Skeen 02/06/2019, 10:22 AM

## 2019-02-07 ENCOUNTER — Inpatient Hospital Stay (HOSPITAL_COMMUNITY): Payer: Medicaid Other

## 2019-02-07 DIAGNOSIS — M7989 Other specified soft tissue disorders: Secondary | ICD-10-CM

## 2019-02-07 LAB — C3 COMPLEMENT: C3 Complement: 143 mg/dL (ref 82–167)

## 2019-02-07 LAB — C4 COMPLEMENT: Complement C4, Body Fluid: 34 mg/dL (ref 14–44)

## 2019-02-07 MED ORDER — DULOXETINE HCL 30 MG PO CPEP
30.0000 mg | ORAL_CAPSULE | Freq: Every day | ORAL | Status: DC
  Administered 2019-02-07 – 2019-02-08 (×2): 30 mg via ORAL
  Filled 2019-02-07 (×2): qty 1

## 2019-02-07 MED ORDER — CYCLOBENZAPRINE HCL 10 MG PO TABS
5.0000 mg | ORAL_TABLET | Freq: Once | ORAL | Status: AC
  Administered 2019-02-08: 5 mg via ORAL
  Filled 2019-02-07: qty 1

## 2019-02-07 MED ORDER — LIDOCAINE 5 % EX PTCH: 1.0000 | MEDICATED_PATCH | Freq: Every day | CUTANEOUS | Status: DC | PRN

## 2019-02-07 NOTE — Progress Notes (Signed)
Patient was tearful due to extreme pain flare-up.  Provided patient with flexaril to control pain and spasms.  K-pad has been helpful throughout the night.  I will keep monitoring patient.

## 2019-02-07 NOTE — Progress Notes (Addendum)
   Subjective: Pt has had improvement in pain this morning.  She is frustrated by her long term situation.  We discussed her fibromyalgia in more detail today.  She would like to go home as we are not giving her any therapy she cannot manage at home and has had improvement this morning.    Objective:  Vital signs in last 24 hours: Vitals:   02/06/19 1452 02/06/19 2038 02/07/19 0609 02/07/19 0815  BP: 127/75 131/78 120/80 138/82  Pulse: 86 80 88   Resp: '14 14 12 14  '$ Temp: 98.3 F (36.8 C) 98.2 F (36.8 C) 97.9 F (36.6 C)   TempSrc: Oral Oral Oral   SpO2: 98% 100% 100%   Weight:   73.9 kg   Height:       Gen: alert and oriented, no distress CV: RRR, no murmurs or rubs Neck: right supraclavicular soft tissue swelling, some cervical lymphadenopathy Pulm: CTAB, normal effort on room air Abd: bs+, soft, non-distended, non-tender Ext: soft tissue swelling of the fingers   Assessment/Plan:  Active Problems:   Precordial chest pain  Ms. Seidl is a 38 yo female with a medical history of SLE on plaquenil and fibromyalgia who presented with left chest pain and diffuse body pain.   Atypical chest pain: DDx includes pericarditis 2/2 SLE, costochondritis, and fibromyalgia. Patient is hemodynamically stable with no physical exam signs of tamponade from pericardial effusion. Her pain has not improved and has required high dose NSAIDs for control. - Cards following, appreciate recs - Colchicine 0.'6mg'$  daily - Ibuprofen '600mg'$  TID - Voltaren gel - discussed restarting cymbalta on discharge - discussed with pcp who suggested pt may benefit from trial of opioid therapy in the short term will consider this  SLE: Unclear whether patient's diffuse body pain is from her SLE or fibromyalgia. She has frequent flares. No renal dysfunction or hematologic abnormalities. CRP wnl. ESR mildly elevated at 24. I spoke with Dr. Alexandria Lodge with [facility] rheumatology. Her colleague follows Ms. Falter. She recommended  initiating a prednisone taper. She will arrange outpatient f/u in their clinic in 1-2 weeks. ESR, CRP and C3/C4 not impressive for lupus flare, DS DNA pending - Continue home plaquenil '200mg'$  daily - F/u dsDNA antibody outpatient - Prednisone taper ('20mg'$  x3 days, '15mg'$  x3 days, '10mg'$  x3 days, '5mg'$  x3days)  Fibromyalgia - Continue home gabapentin '900mg'$  daily - continuing home flexeril - will restart cymbalta on discharge  Multifocal pulmonary nodules and cavitary lesions: Likely 2/2 SLE. - Outpatient f/u with rheumatology for further evaluation  Soft tissue swelling: right supraclavicular swelling and some lymphadenopathy  -will evaluate further with [place] today  Dispo: Anticipated discharge pending clinical improvement  Katherine Roan, MD 02/07/2019, 9:36 AM

## 2019-02-07 NOTE — Progress Notes (Signed)
   Progress Note  Patient Name: Diana Huynh Date of Encounter: 02/07/2019  Primary Cardiologist: Kristeen Miss  I reviewed the chart including consultation note by Dr. Elease Hashimoto with follow-up.  Echocardiogram performed yesterday revealed normal LVEF in the range of 60 to 65% without regional wall motion abnormalities.  No pericardial effusion visualized.  Continue treatment for possible costochondritis and pericarditis.  She is currently on colchicine 0.6 mg daily and ibuprofen 600 mg 3 times daily.  Would continue NSAID course for 7 to 10 days and otherwise remain on colchicine until reassessed.  She can have follow-up arranged with Dr. Elease Hashimoto or APP as an outpatient in 2-3 weeks.  No further inpatient cardiac testing is planned, we will sign off.  Signed, Nona Dell, MD  02/07/2019, 11:27 AM

## 2019-02-07 NOTE — Progress Notes (Signed)
Went to evaluate patient. She was sitting up in bed drinking her morning coffee. She stated that she is frustrated that she is having frequent flares and that she is not being given anything in hospital to control the pain. She has been having at least 1-2 flares every 70 days that has caused her to require steroids or pain medication.   The patient is on maintenance plaquenil alternating 400,mg and 241m. I have also been giving her flexeril 565mbid, gabapentin 90062mhs, and ibuprofen 600m65md in the outpatient setting. These interventions have not been keeping her flare free for more than 2 months at a time.   TTE 02/06/19 did not show pericardial effusion.   Assessment and plan  The patient has diagnosis of SLE and fibromyalgia and new diagnosis of costochondritis this hospitalization. Antibody levels dsDNA pending (was elevated in April at 254), esr 24, crp <0.8, c3 and c4 pending. Her pain has been largely present over bilateral shoulders, left upper chest wall, and cervical spine.   Suspect that her current symptoms maybe largely from fibromyalgia due to her pain at several myofacial points. She does not appear to be in a lupus flare currently. However, due to the patient's frequent flares and frequent opioid and steroid requirement, long-term therapy will need to be discussed with rheumatologist. Recommend continuing ibuprofen, flexeril, gabapentin and also cymbalta.   VahiLars Mage Internal Medicine PGY2 PageMAYOK:[phone], 6:42 AM

## 2019-02-07 NOTE — Discharge Summary (Addendum)
Name: Diana Huynh MRN: [id number] DOB: [birth year] 38 y.o. PCP: Lorenso Courier, MD  Date of Admission: 02/05/2019 10:47 AM Date of Discharge: 02/08/2019 Attending Physician: Carlynn Purl  Discharge Diagnosis:  Atypical chest pain SLE Fibromyalgia Multifocal pulmonary nodules and cavitary lesions   Discharge Medications: Allergies as of 02/08/2019   No Known Allergies     Medication List    STOP taking these medications   HYDROcodone-acetaminophen 5-325 MG tablet Commonly known as:  Norco   ondansetron 4 MG disintegrating tablet Commonly known as:  Zofran ODT     TAKE these medications   alum & mag hydroxide-simeth 200-200-20 MG/5ML suspension Commonly known as:  Maalox Regular Strength Take 30 mLs by mouth every 6 (six) hours as needed for indigestion or heartburn.   benzonatate 100 MG capsule Commonly known as:  TESSALON Take 1 capsule (100 mg total) by mouth every 8 (eight) hours.   colchicine 0.6 MG tablet Take 1 tablet (0.6 mg total) by mouth daily.   cyclobenzaprine 5 MG tablet Commonly known as:  FLEXERIL Take 1 tablet (5 mg total) by mouth 3 (three) times daily as needed for muscle spasms.   DULoxetine 30 MG capsule Commonly known as:  CYMBALTA Take 1 capsule (30 mg total) by mouth daily for 5 days, THEN 2 capsules (60 mg total) daily for 21 days. Start taking on:  Feb 09, 2019   famotidine 20 MG tablet Commonly known as:  PEPCID Take 20 mg by mouth daily.   gabapentin 300 MG capsule Commonly known as:  NEURONTIN Take 3 capsules (900 mg total) by mouth at bedtime. What changed:  when to take this   hydroxychloroquine 200 MG tablet Commonly known as:  PLAQUENIL Take 1 tablet (200 mg total) by mouth daily.   ibuprofen 600 MG tablet Commonly known as:  ADVIL Take 1 tablet (600 mg total) by mouth every 12 (twelve) hours as needed for moderate pain.   omeprazole 40 MG capsule Commonly known as:  PRILOSEC Take 1 capsule (40 mg total) by mouth 2  (two) times daily. What changed:  when to take this   predniSONE 5 MG tablet Commonly known as:  DELTASONE One tablet = .  Please take  x3 days,  x3 days,  x3days       Disposition and follow-up:   Diana Huynh was discharged from [facility] in Good condition.  At the hospital follow up visit please address:  Fibromyalgia SLE Atypical chest pain: DDx includes pericarditis 2/2 SLE, costochondritis, and fibromyalgia.  -ensure pt has follow up appointments listed below -make sure pt understands to increase cymbalta dosage after one week  Multifocal pulmonary nodules and cavitary lesions  -ensure nodules are followed up by rheumatology or [place].  Will likely need repeat chest CT in 6 months  2.  Labs / imaging needed at time of follow-up: n/a  3.  Pending labs/ test needing follow-up: anti DS DNA  Follow-up Appointments: Follow-up Information    Chundi, Sherlyn Lees, MD Follow up in 1 week(s).   Specialty:  Internal Medicine Why:  Please follow up with PCP ina bout one week Contact information: [address] [place] [address] [phone]        Nahser, Deloris Ping, MD .   Specialty:  Cardiology Contact information: [address] [place] [address] [phone]        Dr. Domenic Polite Follow up in 1 week(s).   Why:  please follow up in 1-2  weeks Contact information: Rheumatology - [facility] [place], [place] [address]  [phone]           Hospital Course by problem list:  Atypical chest pain SLE Fibromyalgia Multifocal pulmonary nodules and cavitary lesions  Diana Huynh is a 38 yo female with a medical history of SLEon plaqueniland fibromyalgiawho presented withleft chest pain and diffuse body pain. This has been a recurrent problem for the patient.  It seems to have been exacerbated after a remote viral illness earlier last month.  At that time she was prescribed a  steroid burst.  When she arrived her initial workup included a CT angiogram which was negative for PE but showed a small pericardial effusion and cardiology was consulted who started her on treatment for pericarditis with colchicine she already takes ibuprofen.  Ultimately, her workup was inconclusive. ECHO did not show signs of pericarditis and no effusion was seen. The nature of her chest pain was reproducible and highly atypical and may be more consistent with her fibromyalgia but given her other comorbid conditions it was difficult to say.  Her C3, C4 and inflammatory markers were not consistent with lupus flare, ds DNA still pending. Her rheumatologist was again consulted during this admission and recommended another steroid taper.  Therefore she was treated for pericarditis, costochondritis, lupus flare and fibromyalgia.  Eventually her symptoms improved and she was able to discharge safely.    Discharge Vitals:   BP 129/90   Pulse 82   Temp 98.2 F (36.8 C) (Oral)   Resp 16   Ht 5\' 1"  (1.549 m)   Wt 74.3 kg   LMP  (LMP Unknown)   SpO2 100%   BMI 30.95 kg/m   Pertinent Labs, Studies, and Procedures:  CBC Latest Ref Rng & Units 02/06/2019 02/05/2019 12/29/2018  WBC 4.0 - 10.5 K/uL 7.2 7.0 6.9  Hemoglobin 12.0 - 15.0 g/dL 34.2 87.6 81.1  Hematocrit 36.0 - 46.0 % 35.8(L) 39.1 37.8  Platelets 150 - 400 K/uL 247 262 PLATELET CLUMPS NOTED ON SMEAR, UNABLE TO ESTIMATE   BMP Latest Ref Rng & Units 02/06/2019 02/05/2019 12/29/2018  Glucose 70 - 99 mg/dL 572(I) 96 98  BUN 6 - 20 mg/dL 17 12 6   Creatinine 0.44 - 1.00 mg/dL 2.03 5.59 7.41  BUN/Creat Ratio 9 - 23 - - -  Sodium 135 - 145 mmol/L 135 137 135  Potassium 3.5 - 5.1 mmol/L 4.1 4.1 4.1  Chloride 98 - 111 mmol/L 104 102 104  CO2 22 - 32 mmol/L 22 22 22   Calcium 8.9 - 10.3 mg/dL 8.9 9.4 9.2   IMPRESSIONS    1. The left ventricle has normal systolic function, with an ejection fraction of 60-65%. The cavity size was normal. Left ventricular  diastolic parameters were normal. No evidence of left ventricular regional wall motion abnormalities.  2. The right ventricle has normal systolc function. The cavity was normal. There is no increase in right ventricular wall thickness.  SUMMARY   Normal study  FINDINGS  Left Ventricle: The left ventricle has normal systolic function, with an ejection fraction of 60-65%. The cavity size was normal. There is no increase in left ventricular wall thickness. Left ventricular diastolic parameters were normal. No evidence of  left ventricular regional wall motion abnormalities.  CLINICAL DATA:  Left-sided chest and back pain beginning 3 days ago.  EXAM: CT ANGIOGRAPHY CHEST WITH CONTRAST  TECHNIQUE: Multidetector CT imaging of the chest was performed using the standard protocol  during bolus administration of intravenous contrast. Multiplanar CT image reconstructions and MIPs were obtained to evaluate the vascular anatomy.  CONTRAST:  75mL OMNIPAQUE IOHEXOL 350 MG/ML SOLN  COMPARISON:  Chest radiography same day. Previous chest CT a 12/29/2018  FINDINGS: Cardiovascular: Pulmonary arterial opacification is good. There are no pulmonary emboli. Borderline cardiomegaly as seen previously. Small amount of pericardial fluid not seen previously.  Mediastinum/Nodes: No mass or lymphadenopathy.  Lungs/Pleura: Scattered 3-4 mm nodules now visible in both lungs, not seen previously. Scattered similar-sized cavitary lesions now present in both lungs, more numerous on the right than the left, measuring on average about 6 mm in diameter. These were not present previously. No consolidation, lobar collapse or pleural effusion.  Upper Abdomen: Negative  Musculoskeletal: Negative  Review of the MIP images confirms the above findings.  IMPRESSION: No pulmonary emboli.  Newly seen small pericardial effusion. This could relate to the patient's diagnosis of lupus.  New bilateral  scattered small pulmonary nodules and cavitary lesions, 6 mm and smaller. See axial image 48 for Altmann example. These are uncommon but can be a known phenomenon in lupus.  Electronically Signed: By: Paulina FusiMark  Shogry M.D. On: 02/05/2019 15:04  Discharge Instructions: Discharge Instructions    Diet - low sodium heart healthy   Complete by:  As directed    Discharge instructions   Complete by:  As directed    Please follow up with your rheumatologist, primary care doctor and cardiology.  We will continue your prednisone course per your rheumatologist's instructions.  We have added cymbalta and colchicine to your medications as well that will help with fibromyalgia and pericarditis respectively.   Increase activity slowly   Complete by:  As directed       Signed: Angelita InglesWinfrey, Juno Bozard B, MD 02/09/2019, 10:23 AM

## 2019-02-08 MED ORDER — DULOXETINE HCL 30 MG PO CPEP: ORAL_CAPSULE | ORAL | 0 refills | Status: DC

## 2019-02-08 MED ORDER — PREDNISONE 5 MG PO TABS: ORAL_TABLET | ORAL | 0 refills | Status: DC

## 2019-02-08 MED ORDER — COLCHICINE 0.6 MG PO TABS: 0.6000 mg | ORAL_TABLET | Freq: Every day | ORAL | 0 refills | Status: DC

## 2019-02-08 MED ORDER — OMEPRAZOLE 40 MG PO CPDR: 40.0000 mg | DELAYED_RELEASE_CAPSULE | Freq: Two times a day (BID) | ORAL | 0 refills | Status: DC

## 2019-02-08 NOTE — Progress Notes (Signed)
Work note completed and can be found in the letters portion of EMR

## 2019-02-08 NOTE — Progress Notes (Signed)
Pt c/o severe generalized pain, refused PRN Voltaren. On call IM resident text paged. Resident came to assess pt;ordered  FLEXERIL 5 mg x 1. Med was given to patient along with Voltaren. Pt is currently in bed with eyes closed. Will continue to monitor pt.

## 2019-02-08 NOTE — Discharge Instructions (Signed)
Duloxetine delayed-release capsules What is this medicine? DULOXETINE (doo LOX e teen) is used to treat depression, anxiety, and different types of chronic pain. This medicine may be used for other purposes; ask your health care provider or pharmacist if you have questions. COMMON BRAND NAME(S): Cymbalta, [place] What should I tell my health care provider before I take this medicine? They need to know if you have any of these conditions: -bipolar disorder or a family history of bipolar disorder -glaucoma -kidney disease -liver disease -suicidal thoughts or a previous suicide attempt -taken medicines called MAOIs like Carbex, Eldepryl, Marplan, Nardil, and Parnate within 14 days -an unusual reaction to duloxetine, other medicines, foods, dyes, or preservatives -pregnant or trying to get pregnant -breast-feeding How should I use this medicine? Take this medicine by mouth with a glass of water. Follow the directions on the prescription label. Diana Huynh not cut, crush or chew this medicine. You can take this medicine with or without food. Take your medicine at regular intervals. Diana Huynh not take your medicine more often than directed. Diana Huynh not stop taking this medicine suddenly except upon the advice of your doctor. Stopping this medicine too quickly may cause serious side effects or your condition may worsen. A special MedGuide will be given to you by the pharmacist with each prescription and refill. Be sure to read this information carefully each time. Talk to your pediatrician regarding the use of this medicine in children. While this drug may be prescribed for children as young as 55 years of age for selected conditions, precautions Diana Huynh apply. Overdosage: If you think you have taken too much of this medicine contact a poison control center or emergency room at once. NOTE: This medicine is only for you. Diana Huynh not share this medicine with others. What if I miss a dose? If you miss a dose, take it as soon as you  can. If it is almost time for your next dose, take only that dose. Diana Huynh not take double or extra doses. What may interact with this medicine? Diana Huynh not take this medicine with any of the following medications: -desvenlafaxine -levomilnacipran -linezolid -MAOIs like Carbex, Eldepryl, Marplan, Nardil, and Parnate -methylene blue (injected into a vein) -milnacipran -thioridazine -venlafaxine This medicine may also interact with the following medications: -alcohol -amphetamines -aspirin and aspirin-like medicines -certain antibiotics like ciprofloxacin and enoxacin -certain medicines for blood pressure, heart disease, irregular heart beat -certain medicines for depression, anxiety, or psychotic disturbances -certain medicines for migraine headache like almotriptan, eletriptan, frovatriptan, naratriptan, rizatriptan, sumatriptan, zolmitriptan -certain medicines that treat or prevent blood clots like warfarin, enoxaparin, and dalteparin -cimetidine -fentanyl -lithium -NSAIDS, medicines for pain and inflammation, like ibuprofen or naproxen -phentermine -procarbazine -rasagiline -sibutramine -St. John's wort -theophylline -tramadol -tryptophan This list may not describe all possible interactions. Give your health care provider a list of all the medicines, herbs, non-prescription drugs, or dietary supplements you use. Also tell them if you smoke, drink alcohol, or use illegal drugs. Some items may interact with your medicine. What should I watch for while using this medicine? Tell your doctor if your symptoms Naria Abbey not get better or if they get worse. Visit your doctor or health care professional for regular checks on your progress. Because it may take several weeks to see the full effects of this medicine, it is important to continue your treatment as prescribed by your doctor. Patients and their families should watch out for new or worsening thoughts of suicide or depression. Also watch out for  sudden changes in  feelings such as feeling anxious, agitated, panicky, irritable, hostile, aggressive, impulsive, severely restless, overly excited and hyperactive, or not being able to sleep. If this happens, especially at the beginning of treatment or after a change in dose, call your health care professional. Bonita Quin may get drowsy or dizzy. Takelia Urieta not drive, use machinery, or Jayesh Marbach anything that needs mental alertness until you know how this medicine affects you. Blessed Girdner not stand or sit up quickly, especially if you are an older patient. This reduces the risk of dizzy or fainting spells. Alcohol may interfere with the effect of this medicine. Avoid alcoholic drinks. This medicine can cause an increase in blood pressure. This medicine can also cause a sudden drop in your blood pressure, which may make you feel faint and increase the chance of a fall. These effects are most common when you first start the medicine or when the dose is increased, or during use of other medicines that can cause a sudden drop in blood pressure. Check with your doctor for instructions on monitoring your blood pressure while taking this medicine. Your mouth may get dry. Chewing sugarless gum or sucking hard candy, and drinking plenty of water may help. Contact your doctor if the problem does not go away or is severe. What side effects may I notice from receiving this medicine? Side effects that you should report to your doctor or health care professional as soon as possible: -allergic reactions like skin rash, itching or hives, swelling of the face, lips, or tongue -anxious -breathing problems -confusion -changes in vision -chest pain -confusion -elevated mood, decreased need for sleep, racing thoughts, impulsive behavior -eye pain -fast, irregular heartbeat -feeling faint or lightheaded, falls -feeling agitated, angry, or irritable -hallucination, loss of contact with reality -high blood pressure -loss of balance or  coordination -palpitations -redness, blistering, peeling or loosening of the skin, including inside the mouth -restlessness, pacing, inability to keep still -seizures -stiff muscles -suicidal thoughts or other mood changes -trouble passing urine or change in the amount of urine -trouble sleeping -unusual bleeding or bruising -unusually weak or tired -vomiting -yellowing of the eyes or skin Side effects that usually Joren Rehm not require medical attention (report to your doctor or health care professional if they continue or are bothersome): -change in sex drive or performance -change in appetite or weight -constipation -dizziness -dry mouth -headache -increased sweating -nausea -tired This list may not describe all possible side effects. Call your doctor for medical advice about side effects. You may report side effects to FDA at 1-800-FDA-1088. Where should I keep my medicine? Keep out of the reach of children. Store at room temperature between 20 and 25 degrees C (68 to 77 degrees F). Throw away any unused medicine after the expiration date. NOTE: This sheet is a summary. It may not cover all possible information. If you have questions about this medicine, talk to your doctor, pharmacist, or health care provider.  2019 Elsevier/Gold Standard (2016-02-16 18:16:03) Systemic Lupus Erythematosus, Adult Systemic lupus erythematosus (SLE) is a long-term (chronic) disease that can affect many parts of the body. SLE is an autoimmune disease. With this type of disease, the body's defense system (immune system) mistakenly attacks healthy tissues. This can cause damage to the skin, joints, blood vessels, brain, kidneys, lungs, heart, and other internal organs. It causes pain, irritation, and inflammation. What are the causes? The cause of this condition is not known. What increases the risk? The following factors may make you more likely to develop this condition:  Being female.  Being of  Asian, Hispanic, or African-American descent.  Having a family history of the condition.  Being exposed to tobacco smoke or smoking cigarettes.  Having an infection with a virus, such as Epstein-Barr virus.  Having a history of exposure to silica dust, metals, chemicals, mold or mildew, or insecticides.  Using oral contraceptives or hormone replacement therapy. What are the signs or symptoms? This condition can affect almost any organ or system in the body. Symptoms of the condition depend on which organ or system is affected. The most common symptoms include:  Fever.  Fatigue.  Weight loss.  Muscle aches.  Joint pain.  Skin rashes, especially over the nose and cheeks (butterfly rash) and after sun exposure. Symptoms can come and go. A period of time when symptoms get worse or come back is called a flare. A period of time with no symptoms is called a remission. How is this diagnosed? This condition is diagnosed based on:  Your symptoms.  Your medical history.  A physical exam. You may also have tests, including:  Blood tests.  Urine tests.  A chest X-ray. You may be referred to an autoimmune disease specialist (rheumatologist). How is this treated? There is no cure for this condition, but treatment can help to control symptoms, prevent flares (keep symptoms in remission), and prevent damage to the heart, lungs, kidneys, and other organs. Treatment will depend on what symptoms you are having and what organs or systems are affected. Treatment may involve taking a combination of medicines over time. Common medicines used to treat this condition include:  Antimalarial medicines to control symptoms, prevent flares, and protect against organ damage.  Corticosteroids and NSAIDs to reduce inflammation.  Medicines to weaken your immune system (immunosuppressants).  Biologic response modifiers to reduce inflammation and damage. Follow these instructions at home: Eating  and drinking  Eat a heart-healthy diet. This may include: ? Eating high-fiber foods, such as fresh fruits and vegetables, whole grains, and beans. ? Eating heart-healthy fats (omega-3 fats), such as fish, flaxseed, and flaxseed oil. ? Limiting foods that are high in saturated fat and cholesterol, such as processed and fried foods, fatty meat, and full-fat dairy. ? Limiting how much salt (sodium) you eat.  Include calcium and vitamin D in your diet. Good sources of calcium and vitamin D include: ? Low-fat dairy products such as milk, yogurt, and cheese. ? Certain fish, such as fresh or canned salmon, tuna, and sardines. ? Products that have calcium and vitamin D added to them (fortified products), such as fortified cereals or juice. Medicines  Take over-the-counter and prescription medicines only as told by your health care provider.  Arlicia Paquette not take any medicines that contain estrogen without first checking with your health care provider. Estrogen can trigger flares and may increase your risk for blood clots. Lifestyle      Stay active, as directed by your health care provider.  Michalina Calbert not use any products that contain nicotine or tobacco, such as cigarettes and e-cigarettes. If you need help quitting, ask your health care provider.  Protect your skin from the sun by applying sunblock and wearing protective hats and clothing.  Learn as much as you can about your condition and have a good support system in place. Support may come from family, friends, or a lupus support group. General instructions  Work closely with all of your health care providers to manage your condition.  Stay up to date on all vaccines as directed by your  health care provider.  Keep all follow-up visits as told by your health care provider. This is important. Contact a health care provider if:  You have a fever.  Your symptoms flare.  You develop new symptoms.  You have bloody, foamy, or coffee-colored  urine.  There are changes in your urination. For example, you urinate more often at night.  You think that you may be depressed or have anxiety.  You become pregnant or plan to become pregnant. Pregnancy in women with this condition is considered high risk. Get help right away if:  You have chest pain.  You have trouble breathing.  You have a seizure.  You suddenly get a very bad headache.  You suddenly develop facial or body weakness.  You cannot speak.  You cannot understand speech. These symptoms may represent a serious problem that is an emergency. Diana Huynh not wait to see if the symptoms will go away. Get medical help right away. Call your local emergency services (911 in the [place] Diana Huynh not drive yourself to the hospital. Summary  Systemic lupus erythematosus (SLE) is a long-term disease that can affect many parts of the body.  SLE is an autoimmune disease. That means your body's defense system (immune system) mistakenly attacks healthy tissues.  There is no cure for this condition, but treatment can help to control symptoms, prevent flares, and prevent damage to your organs. Treatment may involve taking a combination of medicines over time. This information is not intended to replace advice given to you by your health care provider. Make sure you discuss any questions you have with your health care provider. Document Released: 09/07/2002 Document Revised: 10/25/2017 Document Reviewed: 10/25/2017 Elsevier Interactive Patient Education  2019 [employer].

## 2019-02-08 NOTE — Progress Notes (Signed)
   Subjective: Pt has had improvement in pain this morning.  She feels the cymbalta is helping her.  She still reports some pain when standing up straight but overall improvement.  She no longer has pleuritic chest pain.    Objective:  Vital signs in last 24 hours: Vitals:   02/07/19 1425 02/07/19 2025 02/08/19 0603 02/08/19 0810  BP: 133/80 131/82 127/79 129/90  Pulse: 93 75 82   Resp: '19 13 12 16  '$ Temp: 98.6 F (37 C) 98 F (36.7 C) 98.2 F (36.8 C)   TempSrc: Oral Oral Oral   SpO2: 99% 98% 100%   Weight:   74.3 kg   Height:       Gen: alert and oriented, no distress CV: RRR, no murmurs or rubs Neck: interval improvement in lymphadenopathy Pulm: CTAB, normal effort on room air  Assessment/Plan:  Active Problems:   Precordial chest pain  Diana Huynh is a 38 yo female with a medical history of SLE on plaquenil and fibromyalgia who presented with left chest pain and diffuse body pain.   Atypical chest pain: DDx includes pericarditis 2/2 SLE, costochondritis, and fibromyalgia. Patient is hemodynamically stable with no physical exam signs of tamponade from pericardial effusion. Her pain has not improved and has required high dose NSAIDs for control. - Colchicine 0.'6mg'$  daily finish one week course - Ibuprofen '600mg'$  TID PRN - Voltaren gel - continuing cymbalta  SLE: Unclear whether patient's diffuse body pain is from her SLE or fibromyalgia. She has frequent flares. No renal dysfunction or hematologic abnormalities. CRP wnl. ESR mildly elevated at 24. I spoke with Dr. Alexandria Lodge with [facility]. Her colleague follows Diana Huynh. She recommended initiating a prednisone taper. She will arrange outpatient f/u in their clinic in 1-2 weeks. ESR, CRP and C3/C4 not impressive for lupus flare, DS DNA pending - Continue home plaquenil  - F/u dsDNA antibody outpatient - Continue Prednisone taper ('20mg'$  x3 days, '15mg'$  x3 days, '10mg'$  x3 days, '5mg'$  x3days)  Fibromyalgia - Continue home  gabapentin '900mg'$  daily - continuing home flexeril - restarted cymbalta  Multifocal pulmonary nodules and cavitary lesions: Likely 2/2 SLE. - Outpatient f/u with rheumatology for further evaluation  Soft tissue swelling: right supraclavicular swelling with some lymphadenopathy  -US negative for abnormalities, interval improvement  Dispo: Anticipated discharge home today  Katherine Roan, MD 02/08/2019, 10:27 AM

## 2019-02-08 NOTE — Progress Notes (Signed)
Pt stable, DC home via wheelchair.  

## 2019-02-10 LAB — ANTI-DNA ANTIBODY, DOUBLE-STRANDED: ds DNA Ab: 57 IU/mL — ABNORMAL HIGH (ref 0–9)

## 2019-02-12 ENCOUNTER — Other Ambulatory Visit: Payer: Self-pay

## 2019-02-12 ENCOUNTER — Ambulatory Visit (INDEPENDENT_AMBULATORY_CARE_PROVIDER_SITE_OTHER): Payer: Self-pay | Admitting: Internal Medicine

## 2019-02-12 DIAGNOSIS — R918 Other nonspecific abnormal finding of lung field: Secondary | ICD-10-CM

## 2019-02-12 DIAGNOSIS — I301 Infective pericarditis: Secondary | ICD-10-CM

## 2019-02-12 DIAGNOSIS — K219 Gastro-esophageal reflux disease without esophagitis: Secondary | ICD-10-CM | POA: Insufficient documentation

## 2019-02-12 DIAGNOSIS — M329 Systemic lupus erythematosus, unspecified: Secondary | ICD-10-CM

## 2019-02-12 DIAGNOSIS — Z7952 Long term (current) use of systemic steroids: Secondary | ICD-10-CM

## 2019-02-12 DIAGNOSIS — IMO0001 Reserved for inherently not codable concepts without codable children: Secondary | ICD-10-CM | POA: Insufficient documentation

## 2019-02-12 DIAGNOSIS — Z79899 Other long term (current) drug therapy: Secondary | ICD-10-CM

## 2019-02-12 DIAGNOSIS — R911 Solitary pulmonary nodule: Secondary | ICD-10-CM | POA: Insufficient documentation

## 2019-02-12 NOTE — Assessment & Plan Note (Signed)
Small scattered pulmonary nodules 6 mm and smaller incidentally found on CT PE study at time of admission for atypical chest pain.  - follow up CT scan in 6 m

## 2019-02-12 NOTE — Assessment & Plan Note (Signed)
Patient reports that she was told to increase omeprazole to 40 mg BID while on prednisone. Has not noticed heartburn symptoms since starting the prednisone.  - continue omeprazole BID until she has reduced prednisone and ibuprofen intake

## 2019-02-12 NOTE — Patient Instructions (Signed)
Thank you for coming to the clinic today. It was a pleasure to see you.   For your pericarditis -  Please continue to take the colchicine and prednisone and cymbalta as prescribed. When you are taking less ibuprofen and have stopped the prednisone you can go back to taking the omeprazole once per day.   Schedule your follow up with cardiology   I have written you a work note   Please call the internal medicine center clinic if you have any questions or concerns, we may be able to help and keep you from a long and expensive emergency room wait. Our clinic and after hours phone number is [phone], the [place] time to call is Monday through Friday 9 am to 4 pm but there is always someone available 24/7 if you have an emergency. If you need medication refills please notify your pharmacy one week in advance and they will send [place] a request.

## 2019-02-12 NOTE — Assessment & Plan Note (Signed)
Patient presents for hospital follow up after hospitalization for atypical chest pain. Hisotry of SLE complicated by pericarditis. Described worsening of atypical chest pain after viral illness, requiring steroid dose pack. CT angio performed in the ED showed a small pericardial effusion and she was started on colchicine. ECHO confirmed signs of pericarditis but showed no pericardial effusion. C3, C4 and anti ds DNA were not consistent with lupus flair. She was prescribed an additional steroid taper course, cymbalta, and colchicine and discharged home. Follow up arranged for cardiology and rheumatology.   Patient reports improvement in chest pain, mostly exacerbated when she leans forward or with excessive exertion. She has just switched to taking prednisone two pills per day which is appropriate for her current taper. She has been taking cymbalta one tablet per day with plans to increase to twice daily this weekent. Shes been taking colchicine one tablet daily. SHe has been talking PRN ibuprofen and cyclobenzaprine. Seems to have very good understanding of med instructions.   - She has scheduled follow up with rheumatology in July  - She has not yet scheduled follow up with cardiology  - continue prednisone and colchicine as prescribed  - work note provided

## 2019-02-12 NOTE — Progress Notes (Signed)
   CC: follow up after hospitalization for pericarditis  HPI:  [name] is a 38 y.o. with PMH as listed below who presents for follow up after hospitalization for pericarditis. Please see the assessment and plans for the status of the patient chronic medical problems.   Past Medical History:  Diagnosis Date  . Bronchitis   . Fibromyalgia   . Lupus (HCC)   . Pleuritis 12/31/2017   Review of Systems:  Refer to history of present illness and assessment and plans for pertinent review of systems, all others reviewed and negative  Physical Exam:  Vitals:   02/12/19 1103  BP: (!) 141/70  Pulse: 94  Temp: 98.4 F (36.9 C)  TempSrc: Oral  SpO2: 100%  Weight: 170 lb 11.2 oz (77.4 kg)  Height: 5\' 1"  (1.549 m)   General: not in acute distress  Cardiac: regular rate and rhythm, no murmurs or rubs, no peripheral edema  Pulm: lungs clear to auscultation   Assessment & Plan:   Pericarditis  Patient presents for hospital follow up after hospitalization for atypical chest pain. Hisotry of SLE complicated by pericarditis. Described worsening of atypical chest pain after viral illness, requiring steroid dose pack. CT angio performed in the ED showed a small pericardial effusion and she was started on colchicine. ECHO confirmed signs of pericarditis but showed no pericardial effusion. C3, C4 and anti ds DNA were not consistent with lupus flair. She was prescribed an additional steroid taper course, cymbalta, and colchicine and discharged home. Follow up arranged for cardiology and rheumatology.   Patient reports improvement in chest pain, mostly exacerbated when she leans forward or with excessive exertion. She has just switched to taking prednisone two pills per day which is appropriate for her current taper. She has been taking cymbalta one tablet per day with plans to increase to twice daily this weekent. Shes been taking colchicine one tablet daily. SHe has been talking PRN ibuprofen and  cyclobenzaprine. Seems to have very good understanding of med instructions.   - She has scheduled follow up with rheumatology in July  - She has not yet scheduled follow up with cardiology  - continue prednisone and colchicine as prescribed  - work note provided   GERD  Patient reports that she was told to increase omeprazole to 40 mg BID while on prednisone. Has not noticed heartburn symptoms since starting the prednisone.  - continue omeprazole BID until she has reduced prednisone and ibuprofen intake   Pulmonary nodules Small scattered pulmonary nodules 6 mm and smaller incidentally found on CT PE study at time of admission for atypical chest pain.  - follow up CT scan in 6 m   See Encounters Tab for problem based charting.  Patient discussed with Dr. Cyndie Chime

## 2019-02-13 NOTE — Progress Notes (Signed)
Medicine attending: Medical history, presenting problems, physical findings, and medications, reviewed with resident physician Dr Eulah Pont on the day of the patient visit and I concur with her evaluation and management plan. 38 y/o dx lupus 3/19 presenting w pericarditis rx colchicine & steroids; now on plaquenil. 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 Recent 5/20 admission. Small pericardial effusion on CT not seen on echo. Now back on colchicine & prednisone. Sx have stabilized; improved. Continue steroid taper. Serologic tests w persistent elevation of anti-DS DNA but normal complement levels.

## 2019-02-18 ENCOUNTER — Telehealth: Payer: Self-pay | Admitting: Pharmacist

## 2019-02-18 NOTE — Progress Notes (Signed)
ASSESSMENT: Date Discharged from Hospital: 02/08/2019 Date Medication Reconciliation Performed: 02/18/2019 Patient was recently discharged from hospital and all medications have been reviewed.  Medications:  New at Discharge: . Colchicine, prednisone Adjustments at Discharge: . None, continue hydroxychloroquine Discontinued at Discharge:   none   Patient reports no issues/concerns with medications, advised patient to contact clinic if concerns arise. Patient verbalized understanding.

## 2019-02-25 ENCOUNTER — Encounter: Payer: Self-pay | Admitting: Cardiology

## 2019-02-25 ENCOUNTER — Telehealth (INDEPENDENT_AMBULATORY_CARE_PROVIDER_SITE_OTHER): Payer: Medicaid Other | Admitting: Cardiology

## 2019-02-25 ENCOUNTER — Other Ambulatory Visit: Payer: Self-pay

## 2019-02-25 VITALS — Ht 61.0 in | Wt 164.0 lb

## 2019-02-25 DIAGNOSIS — M797 Fibromyalgia: Secondary | ICD-10-CM

## 2019-02-25 DIAGNOSIS — I301 Infective pericarditis: Secondary | ICD-10-CM

## 2019-02-25 DIAGNOSIS — M329 Systemic lupus erythematosus, unspecified: Secondary | ICD-10-CM

## 2019-02-25 DIAGNOSIS — M3219 Other organ or system involvement in systemic lupus erythematosus: Secondary | ICD-10-CM

## 2019-02-25 MED ORDER — COLCHICINE 0.6 MG PO TABS: 0.6000 mg | ORAL_TABLET | Freq: Every day | ORAL | 0 refills | Status: DC

## 2019-02-25 NOTE — Progress Notes (Signed)
Virtual Visit via Video Note   This visit type was conducted due to national recommendations for restrictions regarding the COVID-19 Pandemic (e.g. social distancing) in an effort to limit this patient's exposure and mitigate transmission in our community.  Due to her co-morbid illnesses, this patient is at least at moderate risk for complications without adequate follow up.  This format is felt to be most appropriate for this patient at this time.  All issues noted in this document were discussed and addressed.  A limited physical exam was performed with this format.  Please refer to the patient's chart for her consent to telehealth for [facility].   Date:  02/25/2019   ID:  Diana Huynh, DOB 04/16/1981, MRN [id number]  Patient Location: Home Provider Location: Home  PCP:  Lorenso Courier, MD  Cardiologist:  Kristeen Miss, MD  Electrophysiologist:  None   Evaluation Performed:  Follow-Up Visit  Chief Complaint:  Hospital follow up for possible costochondritis/pericarditis.  History of Present Illness:    Diana Huynh is a 38 y.o. female with  a hx of lupus on hydroxychloroquine,  fibromyalgia and had pericarditis in 01/2018. She has no known history of CAD.  She was admitted to [facility] 5/7-02/07/2019 for evaluation of left sided chest pain. She follows with rheumatology for SLE and was given a steroid taper for her discomfort thought to be possibly related to lupus or fibromyalgia. Echocardiogram on  Showed normal LVEF 60-65% without regional wall motion abnormality and no pericardial effusion visualized. She was initiated on treatment including colchicine 0.6mg  daily and ibuprofen 600 mg TID (7-10 days).   [name] is being seen today by virtual visit for hospital follow up. Her pain has eased off. She gets a little shortness of breath when she bends over. She still has some upper back pain and chest pain, worse with movement and stretching. Sometimes it feels like a muscle spasm. She is  still taking colchicine and is taking ibuprofen only as needed. Her prednisone is complete. She has an appt with her rheumatologist on 7/2.   She occ feels a fast heart beat.   She is feeling better than when she went to the hospital.   The patient does not have symptoms concerning for COVID-19 infection (fever, chills, cough, or new shortness of breath).    Past Medical History:  Diagnosis Date  . Bronchitis   . Fibromyalgia   . Lupus (HCC)   . Pleuritis 12/31/2017   Past Surgical History:  Procedure Laterality Date  . ANKLE SURGERY    . APPENDECTOMY    . CESAREAN SECTION       Current Meds  Medication Sig  . alum & mag hydroxide-simeth (MAALOX/MYLANTA) 200-200-20 MG/5ML suspension Take 15 mLs by mouth every 6 (six) hours as needed for indigestion or heartburn.  . colchicine 0.6 MG tablet Take 1 tablet (0.6 mg total) by mouth daily.  . cyclobenzaprine (FLEXERIL) 5 MG tablet Take 1 tablet (5 mg total) by mouth 3 (three) times daily as needed for muscle spasms.  . DULoxetine (CYMBALTA) 30 MG capsule Take 1 capsule (30 mg total) by mouth daily for 5 days, THEN 2 capsules (60 mg total) daily for 21 days.  . famotidine (PEPCID) 20 MG tablet Take 20 mg by mouth as needed.   . gabapentin (NEURONTIN) 300 MG capsule Take 900 mg by mouth daily.  . hydroxychloroquine (PLAQUENIL) 200 MG tablet Take  every other day alternating with 400 mg  . ibuprofen (ADVIL,MOTRIN) 600 MG tablet  Take 1 tablet (600 mg total) by mouth every 12 (twelve) hours as needed for moderate pain.  Marland Kitchen omeprazole (PRILOSEC) 40 MG capsule Take 1 capsule (40 mg total) by mouth 2 (two) times daily.  . [DISCONTINUED] gabapentin (NEURONTIN) 300 MG capsule Take 3 capsules (900 mg total) by mouth at bedtime. (Patient taking differently: Take 900 mg by mouth daily. )     Allergies:   Patient has no known allergies.   Social History   Tobacco Use  . Smoking status: Current Every Day Smoker    Packs/day: 0.25    Types:  Cigarettes    Last attempt to quit: 09/20/2018    Years since quitting: 0.4  . Smokeless tobacco: Never Used  . Tobacco comment: 4-5 cigs/day.  Substance Use Topics  . Alcohol use: Yes  . Drug use: No     Family Hx: The patient's family history includes Breast cancer in her paternal aunt and paternal grandmother; Cervical cancer in her sister; Healthy in her father; Heart failure in her sister; Heart murmur in her mother; Hypertension in her mother; Thyroid disease in her sister.  ROS:   Please see the history of present illness.     All other systems reviewed and are negative.   Prior CV studies:   The following studies were reviewed today:  ECHO 02/06/2019 IMPRESSIONS  1. The left ventricle has normal systolic function, with an ejection fraction of 60-65%. The cavity size was normal. Left ventricular diastolic parameters were normal. No evidence of left ventricular regional wall motion abnormalities.  2. The right ventricle has normal systolc function. The cavity was normal. There is no increase in right ventricular wall thickness.  SUMMARY Normal study  ECHO: 12/27/2017 Study Conclusions - Left ventricle: The cavity size was normal. Wall thickness was normal. Systolic function was normal. The estimated ejection fraction was in the range of 60% to 65%. Wall motion was normal; there were no regional wall motion abnormalities. Left ventricular diastolic function parameters were normal. - Mitral valve: Mildly thickened leaflets . There was trivial regurgitation. - Left atrium: The atrium was normal in size. - Pericardium, extracardiac: A small pericardial effusion was identified. Features were not consistent with tamponade physiology.  Impressions: - LVEF 60-65%, normal wall thickness, normal wall motion, normal diastolic function, trivial MR, normal LA size, small pericardial effusion without tamponade features.   Labs/Other Tests and Data  Reviewed:    EKG:  An ECG dated 02/06/2019 was personally reviewed today and demonstrated:  Sinus tach, 104 bpm, biatrial enlargement, borderline RAD, TWI in leads V1-V4  Recent Labs: 02/06/2019: ALT 16; BUN 17; Creatinine, Ser 0.90; Hemoglobin 12.0; Platelets 247; Potassium 4.1; Sodium 135   Recent Lipid Panel Lab Results  Component Value Date/Time   CHOL 119 12/27/2017 05:06 PM   TRIG 41 12/27/2017 05:06 PM   HDL 31 (L) 12/27/2017 05:06 PM   CHOLHDL 3.8 12/27/2017 05:06 PM   LDLCALC 80 12/27/2017 05:06 PM    Wt Readings from Last 3 Encounters:  02/25/19 164 lb (74.4 kg)  02/12/19 170 lb 11.2 oz (77.4 kg)  02/08/19 163 lb 12.8 oz (74.3 kg)     Objective:    Vital Signs:  Ht 5\' 1"  (1.549 m)   Wt 164 lb (74.4 kg)   LMP  (LMP Unknown)   BMI 30.99 kg/m    VITAL SIGNS:  reviewed GEN:  no acute distress EYES:  sclerae anicteric, EOMI - Extraocular Movements Intact RESPIRATORY:  normal respiratory effort, symmetric expansion NEURO:  alert and oriented x 3, no obvious focal deficit PSYCH:  normal affect  ASSESSMENT & PLAN:    1. Possible pericarditis/costochondritis -Pericarditis vs costochondritis, vs lupus flare and/or fibromyalgia -Started on therapy in the hospital with colchicine and ibuprofen. Echo with normal LV systolic function and no pericardial effusion. -Rheumatology recommended another steroid taper. -She is improving with therapy. Will give her another 30 days of colchicine.  -She was started on cymbalta for fibromyalgia. She feels that it may be helping a little in addition to the gabapentin.  2. Lupus -Follows with rheumatology. On hydroxychloroquine. -pulmonary nodules to be follow up with CT in 6 months per rheumatology or PCP. Has f/u with rheumatology on 7/2.   COVID-19 Education: The signs and symptoms of COVID-19 were discussed with the patient and how to seek care for testing (follow up with PCP or arrange E-visit).  The importance of social distancing  was discussed today.  Time:   Today, I have spent 18 minutes with the patient with telehealth technology discussing the above problems.     Medication Adjustments/Labs and Tests Ordered: Current medicines are reviewed at length with the patient today.  Concerns regarding medicines are outlined above.   Tests Ordered: No orders of the defined types were placed in this encounter.   Medication Changes: No orders of the defined types were placed in this encounter.   Disposition:  Follow up in 4 month(s)  Signed, Berton BonJanine Shakera Ebrahimi, NP  02/25/2019 2:44 PM    [facility]

## 2019-02-25 NOTE — Patient Instructions (Signed)
Medication Instructions:  Your physician recommends that you continue on your current medications as directed. Please refer to the Current Medication list given to you today.  If you need a refill on your cardiac medications before your next appointment, please call your pharmacy.   Lab work: None  If you have labs (blood work) drawn today and your tests are completely normal, you will receive your results only by: Marland Kitchen MyChart Message (if you have MyChart) OR . A paper copy in the mail If you have any lab test that is abnormal or we need to change your treatment, we will call you to review the results.  Testing/Procedures: None  Follow-Up: At [facility], you and your health needs are our priority.  As part of our continuing mission to provide you with exceptional heart care, we have created designated Provider Care Teams.  These Care Teams include your primary Cardiologist (physician) and Advanced Practice Providers (APPs -  Physician Assistants and Nurse Practitioners) who all work together to provide you with the care you need, when you need it. You will need a follow up appointment in:  3-4 months.  Please call our office 2 months in advance to schedule this appointment.  You may see Kristeen Miss, MD or one of the following Advanced Practice Providers on your designated Care Team: Tereso Newcomer, PA-C [place], [place] . Berton Bon, NP  Any Other Special Instructions Will Be Listed Below (If Applicable).  ]

## 2019-02-25 NOTE — Addendum Note (Signed)
Addended by: Vernard Gambles on: 02/25/2019 03:11 PM   Modules accepted: Orders

## 2019-03-23 IMAGING — CT CT ANGIO CHEST
2 of 9 series · 19 of 46 positions shown · IV contrast (iopamidol)
Comparison: 06/30/2017

CLINICAL DATA: Shortness of breath and chest pain

EXAM:
CT ANGIOGRAPHY CHEST WITH CONTRAST
TECHNIQUE: Multidetector CT imaging of the chest was performed using the
standard protocol during bolus administration of intravenous
contrast. Multiplanar CT image reconstructions and MIPs were
obtained to evaluate the vascular anatomy.
CONTRAST:  100mL ZGUR48-JW2 IOPAMIDOL (ZGUR48-JW2) INJECTION 76%

[Series 7: thins · axial · 0.60mm/px · z∈[+1138,+1378]mm · 16 of 265 slices shown]
[im 13/265  lung]
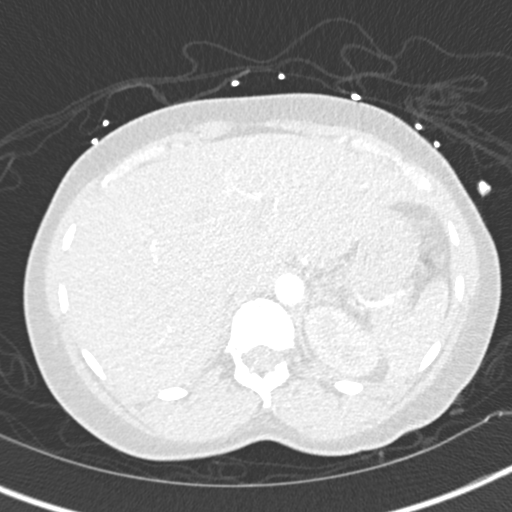
[im 25/265  soft-tissue]
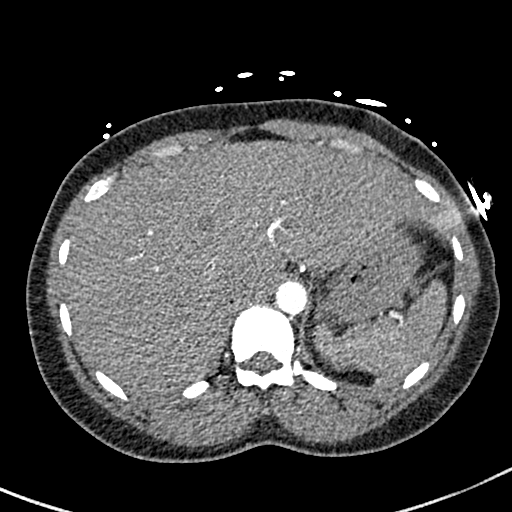
[im 49/265  lung]
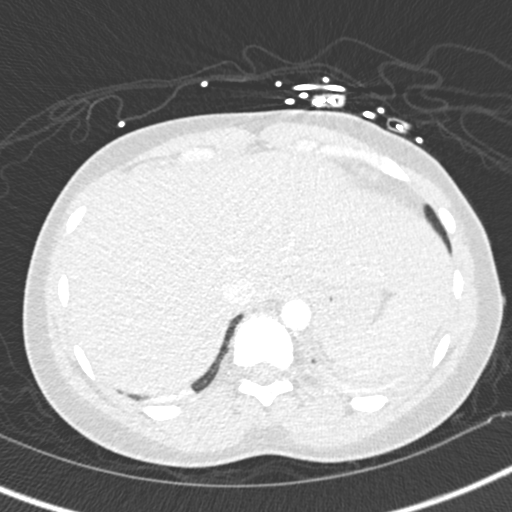
[im 61/265  soft-tissue]
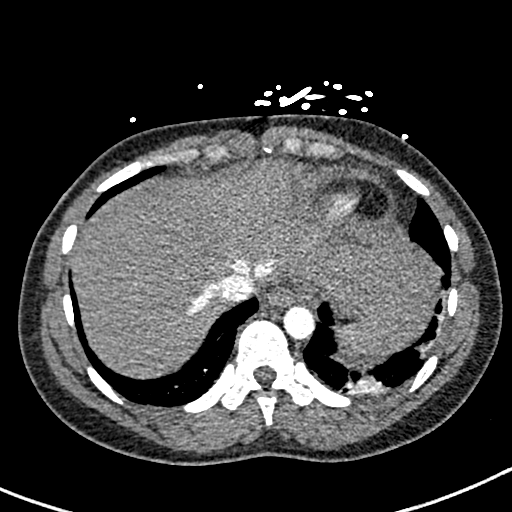
[im 73/265  lung]
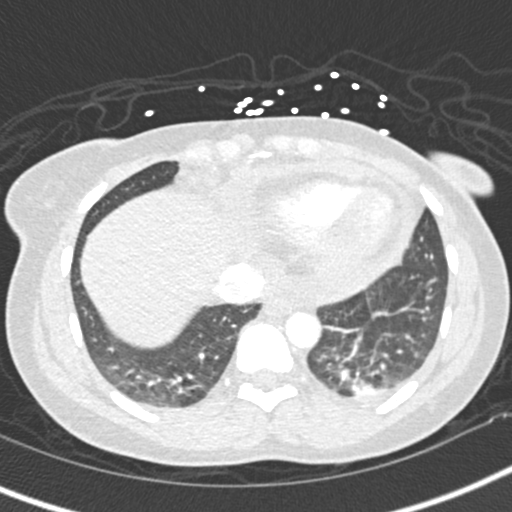
[im 97/265  soft-tissue]
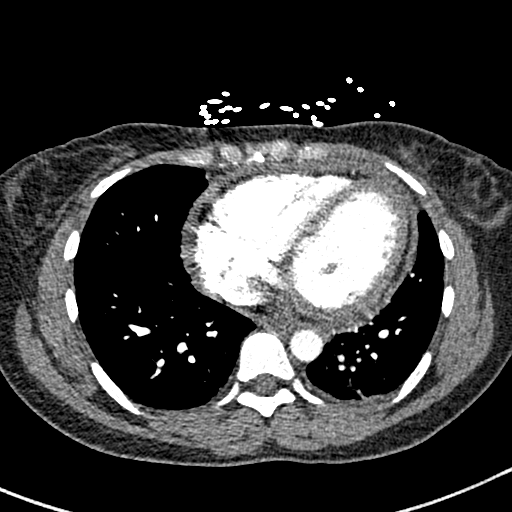
[im 109/265  lung]
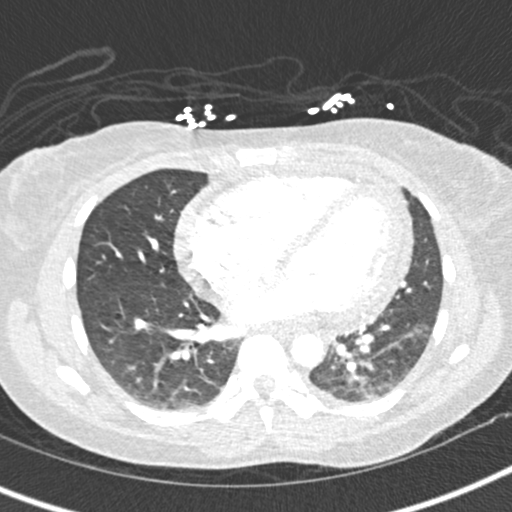
[im 121/265  soft-tissue]
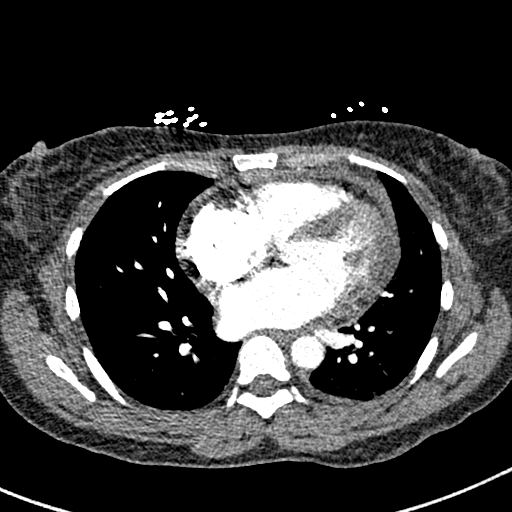
[im 145/265  lung]
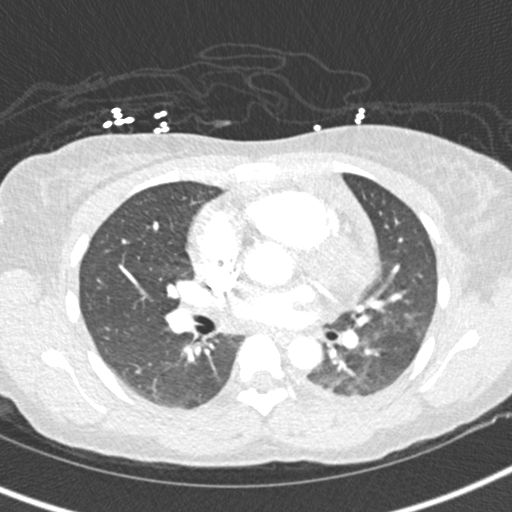
[im 157/265  soft-tissue]
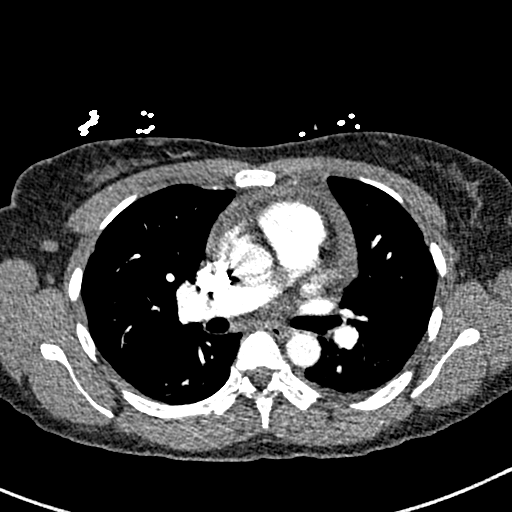
[im 169/265  lung]
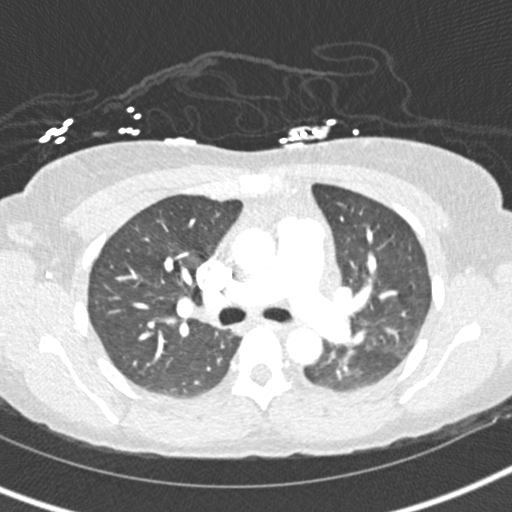
[im 193/265  soft-tissue]
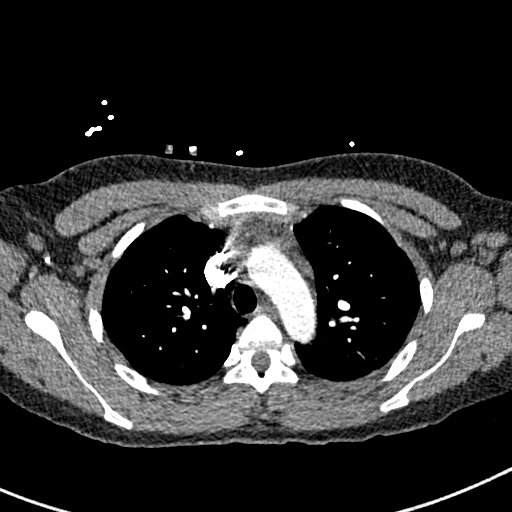
[im 205/265  lung]
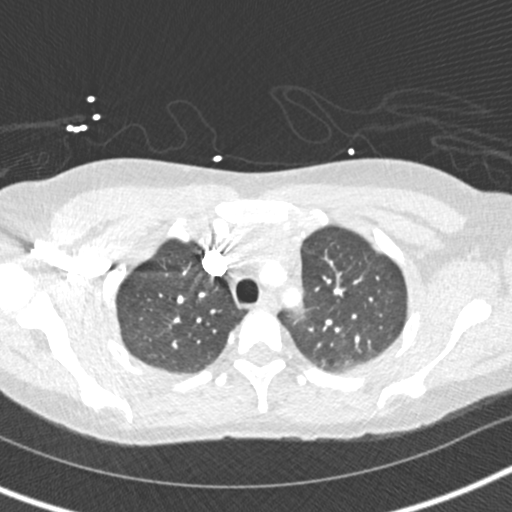
[im 217/265  soft-tissue]
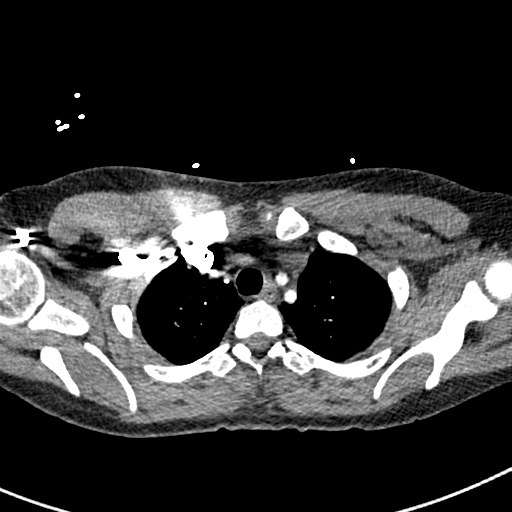
[im 241/265  lung]
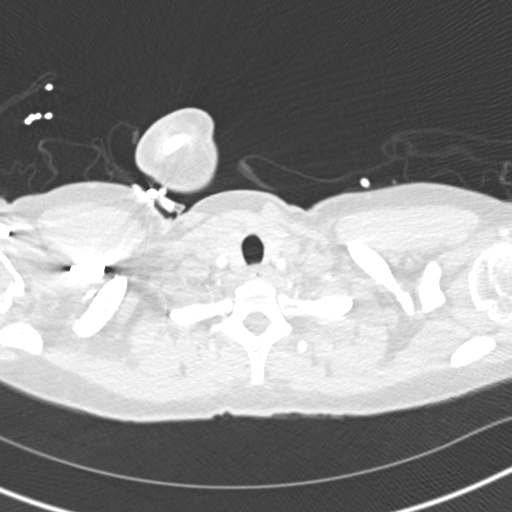
[im 253/265  soft-tissue]
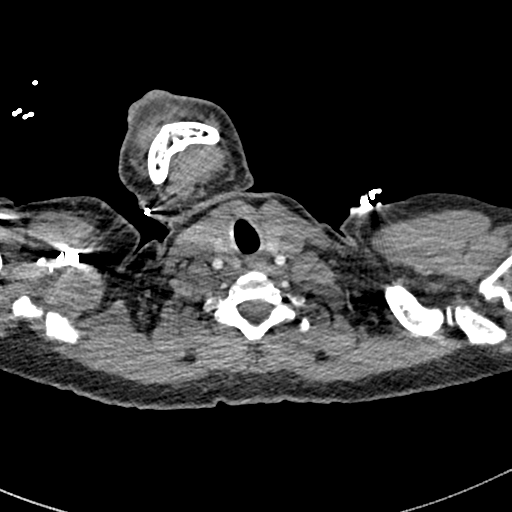

[Series 9: coronal mpr · coronal · 0.52mm/px · 3 of 121 slices shown]
[im 31/121  soft-tissue]
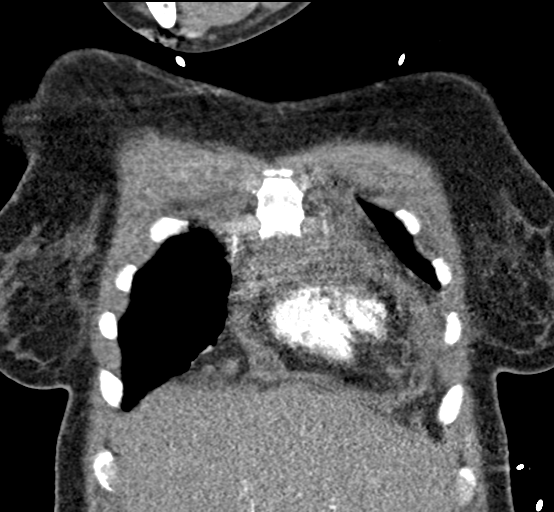
[im 61/121  soft-tissue]
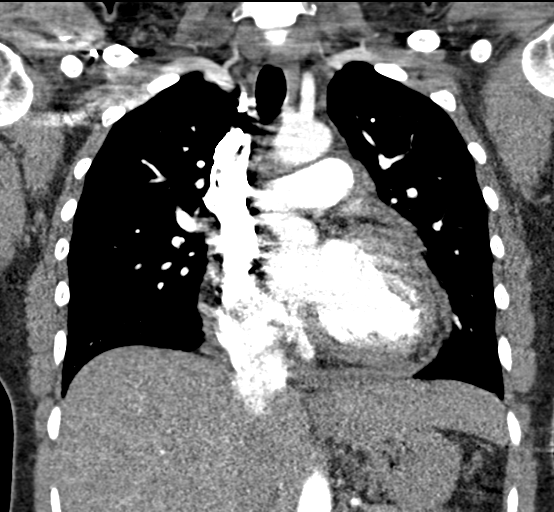
[im 91/121  soft-tissue]
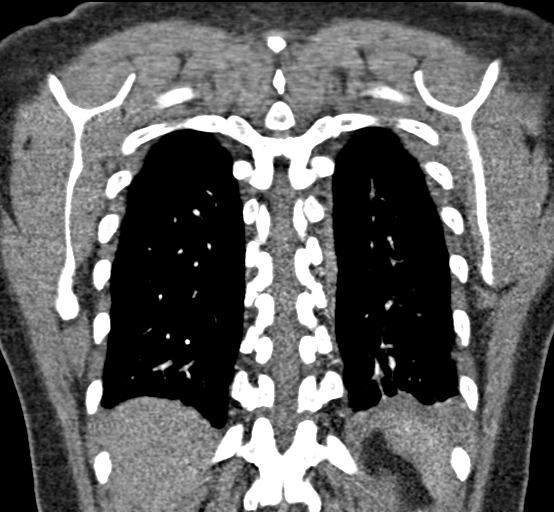

[19 of 46 positions shown; findings below may reference images not displayed]

FINDINGS: Cardiovascular: Borderline heart size. Small to moderate pericardial
effusion. Detection of serosal enhancement is limited by CTA timing,
but there is suggestion of serosal thickening which would imply
pericarditis. Negative for pulmonary embolism. Negative thoracic
aorta.

Mediastinum/Nodes: Negative for adenopathy or mass.

Lungs/Pleura: Mild atelectasis and trace left pleural effusion.

Upper Abdomen: Negative

Musculoskeletal: No acute or aggressive finding

Review of the MIP images confirms the above findings.
IMPRESSION: 1. Small to moderate pericardial effusion. Although limited by CTA
timing, suspect there is associated serosal thickening and
underlying pericarditis.
2. Trace left pleural effusion, possibly from serositis.
3. Atelectasis.
4. Negative for pulmonary embolism.

## 2019-04-01 IMAGING — CT CT ABD-PELV W/ CM
2 of 4 series · 16 of 46 positions shown, 18 images · IV contrast (iopamidol)
Comparison: 12/27/2017 chest CT, CXR 01/05/2018

CLINICAL DATA: Abdominal pain, cough and body aches with weakness.
History of pericarditis.

EXAM:
CT ABDOMEN AND PELVIS WITH CONTRAST
TECHNIQUE: Multidetector CT imaging of the abdomen and pelvis was performed
using the standard protocol following bolus administration of
intravenous contrast.
CONTRAST:  100mL STEO37-6CC IOPAMIDOL (STEO37-6CC) INJECTION 61%

[Series 3: abdomen 5.0 · axial · 0.94mm/px · z∈[+696,+1061]mm · 13 of 83 slices shown, 15 images]
[im 5/83  soft-tissue]
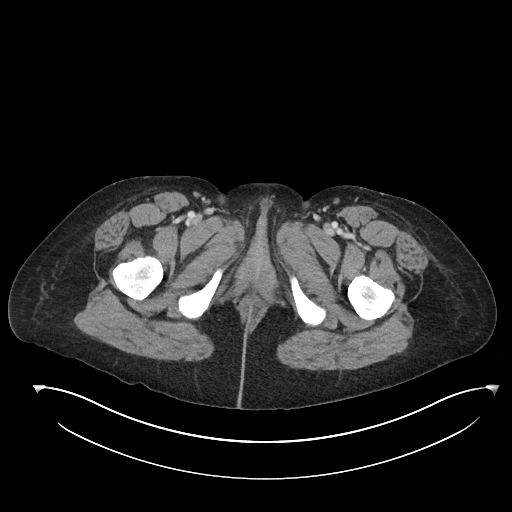
[im 5/83  bone]
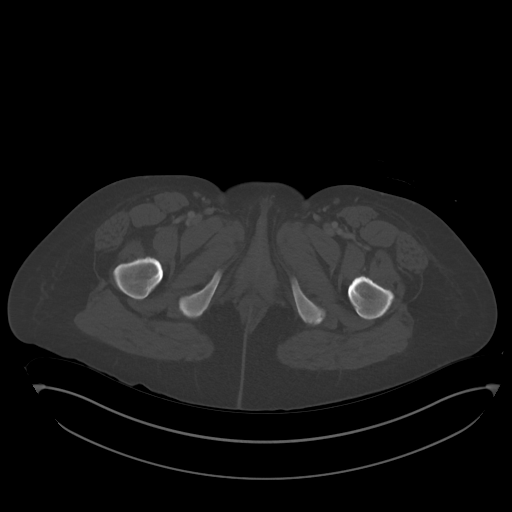
[im 13/83  soft-tissue]
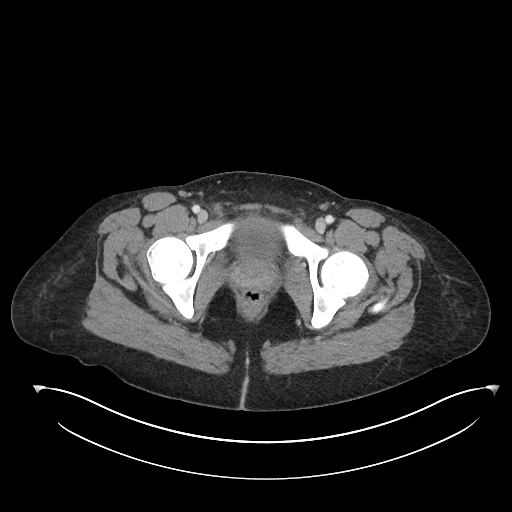
[im 18/83  soft-tissue]
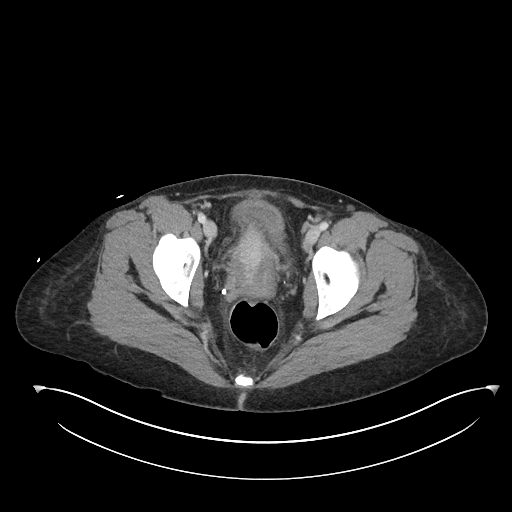
[im 22/83  soft-tissue]
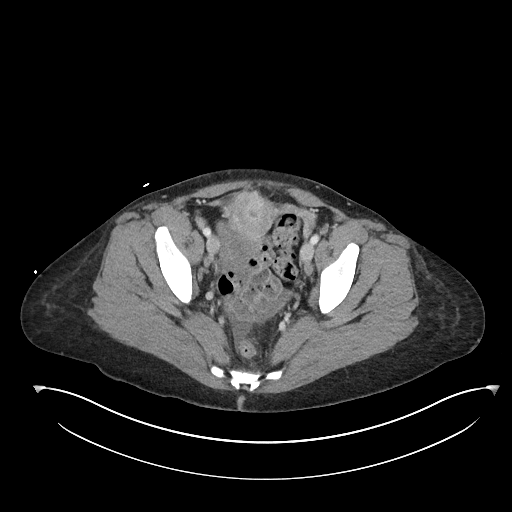
[im 31/83  soft-tissue]
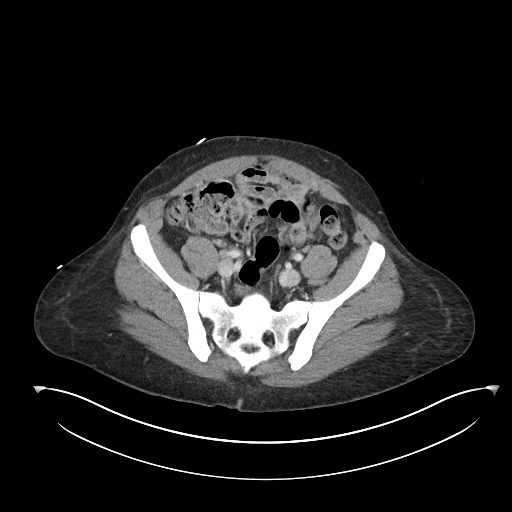
[im 35/83  soft-tissue]
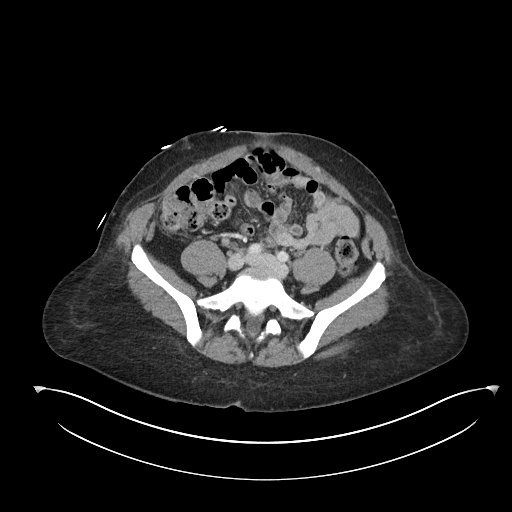
[im 44/83  soft-tissue]
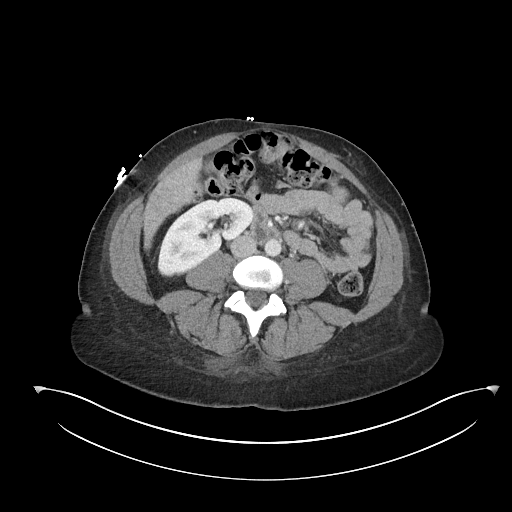
[im 48/83  soft-tissue]
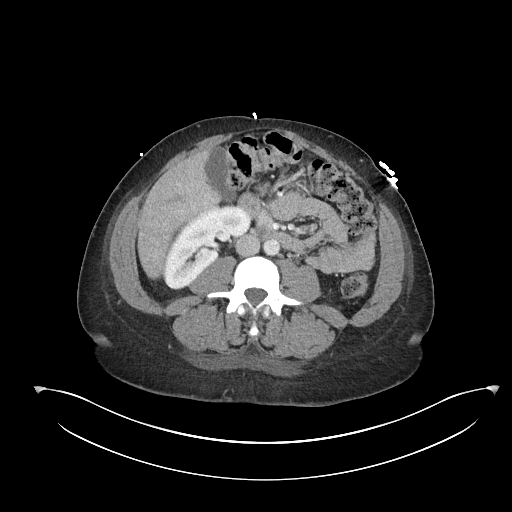
[im 52/83  soft-tissue]
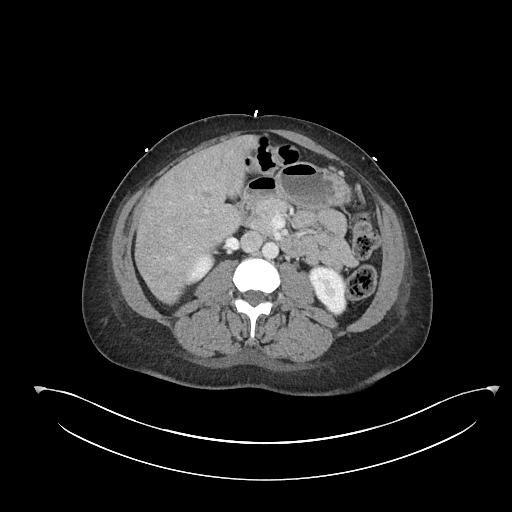
[im 52/83  bone]
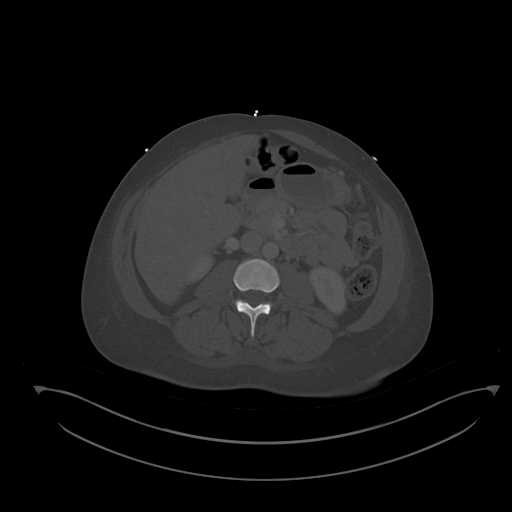
[im 61/83  soft-tissue]
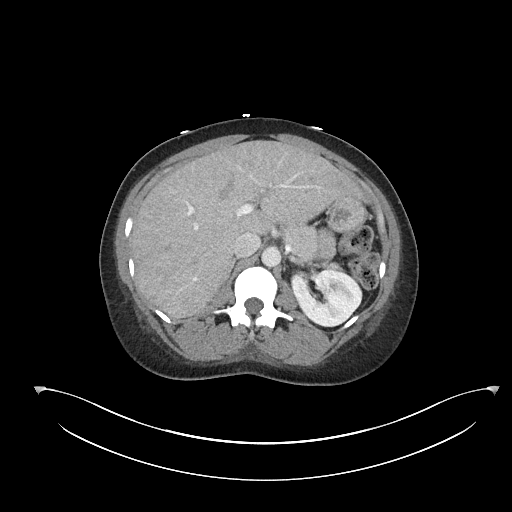
[im 65/83  soft-tissue]
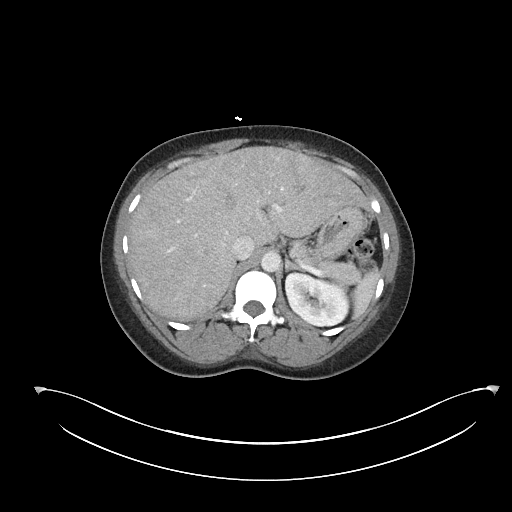
[im 70/83  soft-tissue]
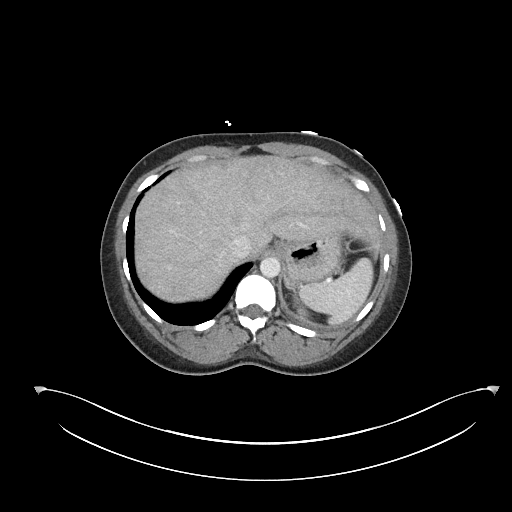
[im 78/83  soft-tissue]
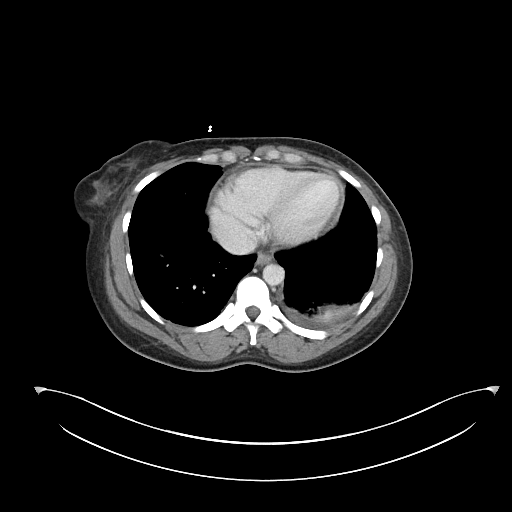

[Series 6: abdomen 3.0 mpr cor · coronal · 0.80mm/px · 3 of 97 slices shown]
[im 33/97  soft-tissue]
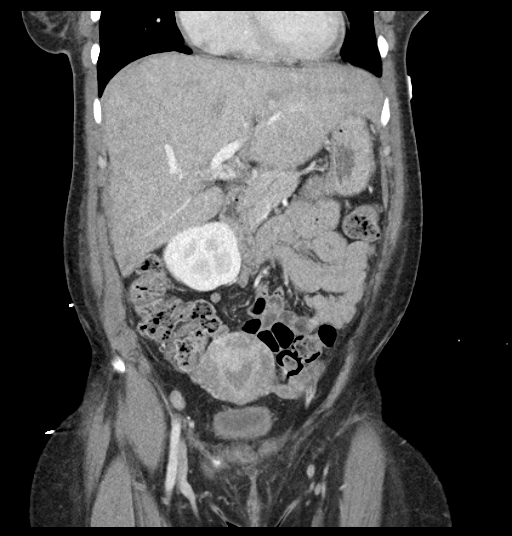
[im 43/97  soft-tissue]
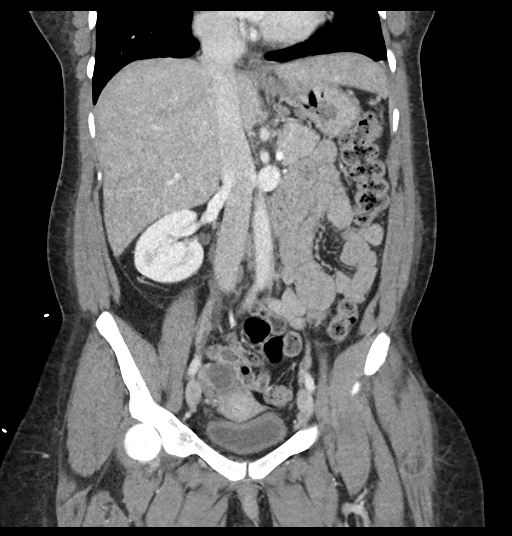
[im 54/97  soft-tissue]
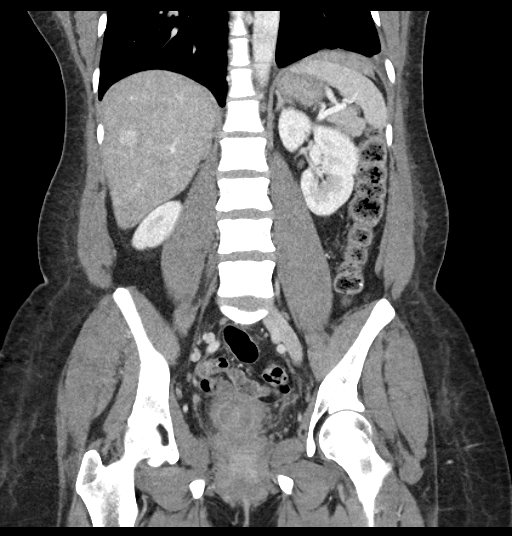

[16 of 46 positions shown; findings below may reference images not displayed]

FINDINGS: Lower chest: Trace left effusion with adjacent atelectasis. Mild
cardiac enlargement without pericardial effusion.

Hepatobiliary: Unremarkable appearance of the gallbladder without
stones. No biliary dilatation. No space-occupying mass of the liver.

Pancreas: Normal

Spleen: Normal

Adrenals/Urinary Tract: Normal bilateral adrenal glands. Congenital
malrotation of the right kidney. No obstructive uropathy. Slightly
thick-walled bladder likely due to underdistention. Correlate to
exclude a cystitis.

Stomach/Bowel: Moderate colonic stool burden without bowel
obstruction or inflammation. Decompressed stomach without mural
thickening. Normal small bowel rotation. Surgically absent appendix.

Vascular/Lymphatic: No aortic aneurysm.  No adenopathy.

Reproductive: Dominant follicle within the right ovary. Uterus is
unremarkable. No adnexal mass.

Other: No free air nor free fluid.

Musculoskeletal: No acute or significant osseous findings.
IMPRESSION: 1. Trace left effusion with adjacent left lower lobe atelectasis.
Mild cardiac enlargement. Resolution of pericardial effusion.
2. Congenital malrotation of the right kidney. Probable under
distention of the bladder counting for circumferential thickening of
the bladder wall. Correlate to exclude cystitis.
3. Moderate colonic stool burden without bowel obstruction or
inflammation.

## 2019-04-27 ENCOUNTER — Other Ambulatory Visit: Payer: Self-pay | Admitting: Internal Medicine

## 2019-04-27 DIAGNOSIS — M328 Other forms of systemic lupus erythematosus: Secondary | ICD-10-CM

## 2019-05-28 NOTE — Progress Notes (Signed)
Cardiology Office Note:    Date:  05/29/2019   ID:  Diana Huynh, DOB April 19, 1981, MRN [id number]  PCP:  Lars Mage, MD  Cardiologist:  Mertie Moores, MD  Electrophysiologist:  None   Referring MD: Lars Mage, MD   Chief Complaint  Patient presents with  . Chest Pain    Aug. 28, 2020    Diana Huynh is a 38 y.o. female with a hx of pericarditis.  I met her during the hospitalization on Feb 05, 2019.  She has a history of lupus.  She is on hydroxychloroquine.  She was started on colchicine and Motrin.    She is seen today for further follow-up.  Having a lupus flaire.  Occasional CP . Still working out the treatment of her lupus and fibromyalgia.  Past Medical History:  Diagnosis Date  . Bronchitis   . Fibromyalgia   . Lupus ([place])   . Pleuritis 12/31/2017    Past Surgical History:  Procedure Laterality Date  . ANKLE SURGERY    . APPENDECTOMY    . CESAREAN SECTION      Current Medications: Current Meds  Medication Sig  . alum & mag hydroxide-simeth (MAALOX/MYLANTA) 200-200-20 MG/5ML suspension Take 15 mLs by mouth every 6 (six) hours as needed for indigestion or heartburn.  . cyclobenzaprine (FLEXERIL) 5 MG tablet TAKE ONE TABLET BY MOUTH THREE TIMES A DAY AS NEEDED FOR MUSCLE SPASMS  . DULoxetine (CYMBALTA) 30 MG capsule Take 1 capsule (30 mg total) by mouth daily for 5 days, THEN 2 capsules (60 mg total) daily for 21 days.  . famotidine (PEPCID) 20 MG tablet Take 20 mg by mouth as needed.   . gabapentin (NEURONTIN) 300 MG capsule Take 900 mg by mouth daily.  . hydroxychloroquine (PLAQUENIL) 200 MG tablet Take '400mg'$  every other day alternating with 400 mg  . ibuprofen (ADVIL,MOTRIN) 600 MG tablet Take 1 tablet (600 mg total) by mouth every 12 (twelve) hours as needed for moderate pain.  Marland Kitchen omeprazole (PRILOSEC) 40 MG capsule Take 1 capsule (40 mg total) by mouth 2 (two) times daily.     Allergies:   Patient has no known allergies.   Social History    Socioeconomic History  . Marital status: Married    Spouse name: Not on file  . Number of children: Not on file  . Years of education: Not on file  . Highest education level: Not on file  Occupational History  . Not on file  Social Needs  . Financial resource strain: Not on file  . Food insecurity    Worry: Not on file    Inability: Not on file  . Transportation needs    Medical: Not on file    Non-medical: Not on file  Tobacco Use  . Smoking status: Current Every Day Smoker    Packs/day: 0.25    Types: Cigarettes    Last attempt to quit: 09/20/2018    Years since quitting: 0.6  . Smokeless tobacco: Never Used  . Tobacco comment: 4-5 cigs/day.  Substance and Sexual Activity  . Alcohol use: Yes  . Drug use: No  . Sexual activity: Yes  Lifestyle  . Physical activity    Days per week: Not on file    Minutes per session: Not on file  . Stress: Not on file  Relationships  . Social Herbalist on phone: Not on file    Gets together: Not on file    Attends religious service: Not on  file    Active member of club or organization: Not on file    Attends meetings of clubs or organizations: Not on file    Relationship status: Not on file  Other Topics Concern  . Not on file  Social History Narrative  . Not on file     Family History: The patient's family history includes Breast cancer in her paternal aunt and paternal grandmother; Cervical cancer in her sister; Healthy in her father; Heart failure in her sister; Heart murmur in her mother; Hypertension in her mother; Thyroid disease in her sister.  ROS:   Please see the history of present illness.     All other systems reviewed and are negative.  EKGs/Labs/Other Studies Reviewed:    The following studies were reviewed today:   EKG:  Recent Labs: 02/06/2019: ALT 16; BUN 17; Creatinine, Ser 0.90; Hemoglobin 12.0; Platelets 247; Potassium 4.1; Sodium 135  Recent Lipid Panel    Component Value Date/Time   CHOL  119 12/27/2017 1706   TRIG 41 12/27/2017 1706   HDL 31 (L) 12/27/2017 1706   CHOLHDL 3.8 12/27/2017 1706   VLDL 8 12/27/2017 1706   LDLCALC 80 12/27/2017 1706    Physical Exam:    VS:  BP 104/82   Pulse (!) 106   Ht 5' 1" (1.549 m)   Wt 180 lb 6.4 oz (81.8 kg)   SpO2 92%   BMI 34.09 kg/m     Wt Readings from Last 3 Encounters:  05/29/19 180 lb 6.4 oz (81.8 kg)  02/25/19 164 lb (74.4 kg)  02/12/19 170 lb 11.2 oz (77.4 kg)     GEN:  Young female,  Mildly uncomfortable  HEENT: Normal NECK: No JVD; No carotid bruits LYMPHATICS: No lymphadenopathy CARDIAC:  RR , mild tachycardia , no rub  RESPIRATORY:  Clear to auscultation without rales, wheezing or rhonchi  ABDOMEN: Soft, non-tender, non-distended MUSCULOSKELETAL:  No edema; No deformity  SKIN: Warm and dry NEUROLOGIC:  Alert and oriented x 3 PSYCHIATRIC:  Normal affect   ASSESSMENT:    No diagnosis found. PLAN:    In order of problems listed above:   1.  Lupus Pericarditis :   No recurrence.   Will see her on an as needed basis.   Continue the plan from her rheumatologist.      Medication Adjustments/Labs and Tests Ordered: Current medicines are reviewed at length with the patient today.  Concerns regarding medicines are outlined above.  No orders of the defined types were placed in this encounter.  No orders of the defined types were placed in this encounter.   Patient Instructions  Your physician recommends that you continue on your current medications as directed. Please refer to the Current Medication list given to you today.  Your physician recommends that you schedule a follow-up appointment in:  AS NEEDED    Signed, Mertie Moores, MD  05/29/2019 9:54 AM    [place]

## 2019-05-29 ENCOUNTER — Encounter: Payer: Self-pay | Admitting: Cardiovascular Disease

## 2019-05-29 ENCOUNTER — Other Ambulatory Visit: Payer: Self-pay

## 2019-05-29 ENCOUNTER — Ambulatory Visit (INDEPENDENT_AMBULATORY_CARE_PROVIDER_SITE_OTHER): Payer: Medicaid Other | Admitting: Cardiovascular Disease

## 2019-05-29 VITALS — BP 104/82 | HR 106 | Ht 61.0 in | Wt 180.4 lb

## 2019-05-29 DIAGNOSIS — I313 Pericardial effusion (noninflammatory): Secondary | ICD-10-CM

## 2019-05-29 DIAGNOSIS — M3219 Other organ or system involvement in systemic lupus erythematosus: Secondary | ICD-10-CM | POA: Diagnosis not present

## 2019-05-29 DIAGNOSIS — I3139 Other pericardial effusion (noninflammatory): Secondary | ICD-10-CM

## 2019-05-29 NOTE — Patient Instructions (Addendum)
Your physician recommends that you continue on your current medications as directed. Please refer to the Current Medication list given to you today.    Your physician recommends that you schedule a follow-up appointment in:  AS NEEDED 

## 2019-06-25 ENCOUNTER — Telehealth: Payer: Self-pay | Admitting: *Deleted

## 2019-06-25 ENCOUNTER — Emergency Department (HOSPITAL_COMMUNITY): Payer: Medicaid Other

## 2019-06-25 ENCOUNTER — Emergency Department (HOSPITAL_COMMUNITY)
Admission: EM | Admit: 2019-06-25 | Discharge: 2019-06-25 | Disposition: A | Discharge status: Home / Self Care | Payer: Medicaid Other | Attending: Emergency Medicine | Admitting: Emergency Medicine

## 2019-06-25 ENCOUNTER — Other Ambulatory Visit: Payer: Self-pay

## 2019-06-25 ENCOUNTER — Encounter (HOSPITAL_COMMUNITY): Payer: Self-pay

## 2019-06-25 ENCOUNTER — Ambulatory Visit (INDEPENDENT_AMBULATORY_CARE_PROVIDER_SITE_OTHER): Payer: Medicaid Other | Admitting: Internal Medicine

## 2019-06-25 DIAGNOSIS — M3210 Systemic lupus erythematosus, organ or system involvement unspecified: Secondary | ICD-10-CM | POA: Insufficient documentation

## 2019-06-25 DIAGNOSIS — R072 Precordial pain: Secondary | ICD-10-CM

## 2019-06-25 DIAGNOSIS — B349 Viral infection, unspecified: Secondary | ICD-10-CM | POA: Diagnosis not present

## 2019-06-25 DIAGNOSIS — F17210 Nicotine dependence, cigarettes, uncomplicated: Secondary | ICD-10-CM | POA: Diagnosis not present

## 2019-06-25 DIAGNOSIS — R42 Dizziness and giddiness: Secondary | ICD-10-CM | POA: Diagnosis not present

## 2019-06-25 DIAGNOSIS — Z20828 Contact with and (suspected) exposure to other viral communicable diseases: Secondary | ICD-10-CM | POA: Insufficient documentation

## 2019-06-25 DIAGNOSIS — R05 Cough: Secondary | ICD-10-CM

## 2019-06-25 DIAGNOSIS — R6883 Chills (without fever): Secondary | ICD-10-CM

## 2019-06-25 DIAGNOSIS — Z79899 Other long term (current) drug therapy: Secondary | ICD-10-CM | POA: Insufficient documentation

## 2019-06-25 DIAGNOSIS — R0782 Intercostal pain: Secondary | ICD-10-CM | POA: Insufficient documentation

## 2019-06-25 DIAGNOSIS — R059 Cough, unspecified: Secondary | ICD-10-CM

## 2019-06-25 DIAGNOSIS — M329 Systemic lupus erythematosus, unspecified: Secondary | ICD-10-CM

## 2019-06-25 LAB — BASIC METABOLIC PANEL
Anion gap: 11 (ref 5–15)
BUN: 7 mg/dL (ref 6–20)
CO2: 25 mmol/L (ref 22–32)
Calcium: 9.2 mg/dL (ref 8.9–10.3)
Chloride: 101 mmol/L (ref 98–111)
Creatinine, Ser: 0.97 mg/dL (ref 0.44–1.00)
GFR calc Af Amer: >60 mL/min (ref >60)
GFR calc non Af Amer: >60 mL/min (ref >60)
Glucose, Bld: 115 mg/dL — ABNORMAL HIGH (ref 70–99)
Potassium: 3.5 mmol/L (ref 3.5–5.1)
Sodium: 137 mmol/L (ref 135–145)

## 2019-06-25 LAB — TROPONIN I (HIGH SENSITIVITY)
Troponin I (High Sensitivity): <2 ng/L (ref <18)
Troponin I (High Sensitivity): <2 ng/L (ref <18)

## 2019-06-25 LAB — CBC
HCT: 36.9 % (ref 36.0–46.0)
Hemoglobin: 12.4 g/dL (ref 12.0–15.0)
MCH: 28.8 pg (ref 26.0–34.0)
MCHC: 33.6 g/dL (ref 30.0–36.0)
MCV: 85.8 fL (ref 80.0–100.0)
Platelets: 289 10*3/uL (ref 150–400)
RBC: 4.3 MIL/uL (ref 3.87–5.11)
RDW: 14.6 % (ref 11.5–15.5)
WBC: 6.3 10*3/uL (ref 4.0–10.5)
nRBC: 0 % (ref 0.0–0.2)

## 2019-06-25 LAB — I-STAT BETA HCG BLOOD, ED (MC, WL, AP ONLY): I-stat hCG, quantitative: <5 m[IU]/mL (ref <5)

## 2019-06-25 MED ORDER — IBUPROFEN 600 MG PO TABS: 600.0000 mg | ORAL_TABLET | Freq: Four times a day (QID) | ORAL | 0 refills | Status: DC | PRN

## 2019-06-25 MED ORDER — SODIUM CHLORIDE 0.9% FLUSH: 3.0000 mL | Freq: Once | INTRAVENOUS | Status: DC

## 2019-06-25 MED ORDER — BENZONATATE 100 MG PO CAPS: 100.0000 mg | ORAL_CAPSULE | Freq: Three times a day (TID) | ORAL | 0 refills | Status: DC

## 2019-06-25 MED ORDER — IBUPROFEN 400 MG PO TABS
600.0000 mg | ORAL_TABLET | Freq: Once | ORAL | Status: AC
  Administered 2019-06-25: 600 mg via ORAL
  Filled 2019-06-25: qty 1

## 2019-06-25 NOTE — Telephone Encounter (Signed)
Patient called in c/o 5 day hx of cough, scratchy throat, and runny nose. Also, with 3 day hx of right sided chest pain that goes to back associated with cough. Decreased appetite x 3 days with decreased sense of taste, and night sweats. No recent travel, no sick contacts. Lives with husband and 2 children. Placed on [facility] schedule for telehealth visit. Hubbard Hartshorn, RN, BSN

## 2019-06-25 NOTE — ED Provider Notes (Signed)
[facility] EMERGENCY DEPARTMENT Provider Note   CSN: [id number] Arrival date & time: 06/25/19  1039     History   Chief Complaint Chief Complaint  Patient presents with  . Chest Pain  . Cough    HPI Diana Huynh is a 38 y.o. female with PMHx fibromyalgia, pleuritis, lupus who presents to the ED today of gradual onset, constant, productive cough x5 days.  Patient also complains of right-sided chest pain that began shortly after coughing.  She states she has been taking her regular medication for her lupus as well as fibromyalgia which includes Plaquenil, Penton, Cymbalta, Lexapro.  Patient states that she has been out of ibuprofen for about 2 to 3 weeks and has not been taking anything else for pain.  No known recent COVID-19 positive exposures.  She was tested last month and was negative.  Patient reports that she has had some chills but denies fever.  Denies shortness of breath, nausea, vomiting, leg swelling, any other associated symptoms.  No recent prolonged travel or immobilization.  History of DVT/PE.  No OCPs.  No hemoptysis.       Past Medical History:  Diagnosis Date  . Bronchitis   . Fibromyalgia   . Lupus ([place])   . Pleuritis 12/31/2017    Patient Active Problem List   Diagnosis Date Noted  . GERD (gastroesophageal reflux disease) 02/12/2019  . Pulmonary nodule less than 6 cm determined by computed tomography of lung 02/12/2019  . Precordial chest pain 02/05/2019  . Cough 01/06/2019  . Healthcare maintenance 07/25/2018  . Fibromyalgia 06/25/2018  . Acute non-recurrent frontal sinusitis 02/10/2018  . Viral pericarditis 01/06/2018  . SLE (systemic lupus erythematosus) ([place]) 12/31/2017  . Pericardial effusion     Past Surgical History:  Procedure Laterality Date  . ANKLE SURGERY    . APPENDECTOMY    . CESAREAN SECTION       OB History   No obstetric history on file.      Home Medications    Prior to Admission medications   Medication  Sig Start Date End Date Taking? Authorizing Provider  alum & mag hydroxide-simeth (MAALOX/MYLANTA) 200-200-20 MG/5ML suspension Take 15 mLs by mouth every 6 (six) hours as needed for indigestion or heartburn.    [provider]  cyclobenzaprine (FLEXERIL) 5 MG tablet TAKE ONE TABLET BY MOUTH THREE TIMES A DAY AS NEEDED FOR MUSCLE SPASMS 04/28/19   Chundi, Verne Spurr, MD  DULoxetine (CYMBALTA) 30 MG capsule Take 1 capsule (30 mg total) by mouth daily for 5 days, THEN 2 capsules (60 mg total) daily for 21 days. 02/09/19   Katherine Roan, MD  famotidine (PEPCID) 20 MG tablet Take 20 mg by mouth as needed.     [provider]  gabapentin (NEURONTIN) 300 MG capsule Take 900 mg by mouth daily.    [provider]  hydroxychloroquine (PLAQUENIL) 200 MG tablet Take 400mg  every other day alternating with 400 mg    [provider]  ibuprofen (ADVIL) 600 MG tablet Take 1 tablet (600 mg total) by mouth every 6 (six) hours as needed. 06/25/19   Eustaquio Maize, PA-C  omeprazole (PRILOSEC) 40 MG capsule Take 1 capsule (40 mg total) by mouth 2 (two) times daily. 02/08/19   Katherine Roan, MD    Family History Family History  Problem Relation Age of Onset  . Hypertension Mother   . Heart murmur Mother   . Healthy Father   . Thyroid disease Sister   .  Breast cancer Paternal Grandmother   . Breast cancer Paternal Aunt   . Heart failure Sister        has ICD  . Cervical cancer Sister     Social History Social History   Tobacco Use  . Smoking status: Current Every Day Smoker    Packs/day: 0.25    Types: Cigarettes    Last attempt to quit: 09/20/2018    Years since quitting: 0.7  . Smokeless tobacco: Never Used  . Tobacco comment: 4-5 cigs/day.  Substance Use Topics  . Alcohol use: Yes  . Drug use: No     Allergies   Patient has no known allergies.   Review of Systems Review of Systems  Constitutional: Negative for chills and fever.  HENT: Negative for  congestion.   Eyes: Negative for visual disturbance.  Respiratory: Positive for cough. Negative for shortness of breath.   Cardiovascular: Positive for chest pain. Negative for palpitations and leg swelling.  Gastrointestinal: Negative for abdominal pain, nausea and vomiting.  Genitourinary: Negative for difficulty urinating.  Musculoskeletal: Negative for myalgias.  Skin: Negative for rash.  Neurological: Negative for headaches.     Physical Exam Updated Vital Signs BP 138/84 (BP Location: Right Arm)   Pulse 76   Temp 98 F (36.7 C) (Oral)   Resp 18   Ht 5\' 1"  (1.549 m)   Wt 81.6 kg   SpO2 100%   BMI 34.01 kg/m   Physical Exam Vitals signs and nursing note reviewed.  Constitutional:      Comments: Uncomfortable appearing female.  HENT:     Head: Normocephalic and atraumatic.  Eyes:     Conjunctiva/sclera: Conjunctivae normal.  Neck:     Musculoskeletal: Normal range of motion and neck supple.  Cardiovascular:     Rate and Rhythm: Normal rate and regular rhythm.     Pulses:          Radial pulses are 2+ on the right side and 2+ on the left side.       Dorsalis pedis pulses are 2+ on the right side and 2+ on the left side.     Heart sounds: Normal heart sounds.  Pulmonary:     Effort: Pulmonary effort is normal.     Breath sounds: Normal breath sounds. No decreased breath sounds, wheezing, rhonchi or rales.  Chest:     Chest wall: Tenderness present.     Comments: Right lateral chest wall tenderness to palpation without crepitus. Abdominal:     Palpations: Abdomen is soft.     Tenderness: There is no abdominal tenderness.  Musculoskeletal:     Right lower leg: No edema.     Left lower leg: No edema.  Skin:    General: Skin is warm and dry.  Neurological:     Mental Status: She is alert.      ED Treatments / Results  Labs (all labs ordered are listed, but only abnormal results are displayed) Labs Reviewed  BASIC METABOLIC PANEL - Abnormal; Notable for  the following components:      Result Value   Glucose, Bld 115 (*)    All other components within normal limits  NOVEL CORONAVIRUS, NAA (HOSP ORDER, SEND-OUT TO REF LAB; TAT 18-24 HRS)  CBC  I-STAT BETA HCG BLOOD, ED ([facility], [facility], [facility] ONLY)  TROPONIN I (HIGH SENSITIVITY)  TROPONIN I (HIGH SENSITIVITY)    EKG EKG Interpretation  Date/Time:  Thursday June 25 2019 10:53:38 EDT Ventricular Rate:  102 PR Interval:  QRS Duration: 74 QT Interval:  524 QTC Calculation: 682 R Axis:   77 Text Interpretation:   Critical Test Result: Long QTc Accelerated Junctional rhythm Septal infarct , age undetermined Prolonged QT Abnormal ECG suspect QTc is closer to 450-500 range and the computer read is 2/2 artifact.  improved ST changes from most recent in May Confirmed by Marily Memos [phone]) on 06/25/2019 4:32:02 PM   Radiology Dg Chest 2 View  Result Date: 06/25/2019 CLINICAL DATA:  Right-sided chest pain and cough for 5 days. EXAM: CHEST - 2 VIEW COMPARISON:  02/05/2019 FINDINGS: The cardiac silhouette, mediastinal and hilar contours are normal and stable. The lungs are clear. No pleural effusions. No worrisome pulmonary lesions. The bony thorax is intact. IMPRESSION: No acute cardiopulmonary findings. Electronically Signed   By: Rudie Meyer M.D.   On: 06/25/2019 12:59    Procedures Procedures (including critical care time)  Medications Ordered in ED Medications  sodium chloride flush (NS) 0.9 % injection 3 mL (has no administration in time range)  ibuprofen (ADVIL) tablet 600 mg (600 mg Oral Given 06/25/19 1606)     Initial Impression / Assessment and Plan / ED Course  I have reviewed the triage vital signs and the nursing notes.  Pertinent labs & imaging results that were available during my care of the patient were reviewed by me and considered in my medical decision making (see chart for details).    38 year old female who presents the ED complaining of right-sided chest pain and  productive cough for the last 5 days.  Also endorses chills.  Denies fever.  Patient afebrile in ED without tachycardia or tachypnea.  No COVID-19 positive exposure.  PERC negative.  Labs obtained prior to being seen-chest x-ray clear and without signs of infection today. EKG without ischemic changes.  Leukocytosis.  No electrolyte abnormalities.  Initial troponin less than 2.  Will repeat today.  Patient endorses that she typically takes 600 mg ibuprofen 3 times daily for her fibromyalgia pain but ran out 2 to 3 weeks ago and has not even been taking any over-the-counter ibuprofen.  Suspect her chest pain is likely more to costochondritis given patient has been coughing as well.  Will give 600 mg ibuprofen in the ED and reevaluate.  Also test patient for COVID-19 at this time given complaints of chills, cough, chest pain.  Will need to self isolate at home until she receives her results.  She is in agreement with plan at this time.   Repeat Troponin less than 2.  On reevaluation patient is sleeping comfortably in room after receiving 600 mg ibuprofen.  Discharge home with ibuprofen at this time.  Patient advised to follow-up with her PCP.  We have sent COVID test for her which can take up to 48 hours to return.  Patient advised to self isolate until she receives results.  She will need to self isolate for 10 days starting today if positive.  Strict return precautions have been discussed with patient.  She is in agreement with plan at this time and stable for discharge home.   Diana Huynh was evaluated in Emergency Department on 06/25/2019 for the symptoms described in the history of present illness. She was evaluated in the context of the global COVID-19 pandemic, which necessitated consideration that the patient might be at risk for infection with the SARS-CoV-2 virus that causes COVID-19. Institutional protocols and algorithms that pertain to the evaluation of patients at risk for COVID-19 are in a state of  rapid change based on information released by regulatory bodies including the CDC and federal and state organizations. These policies and algorithms were followed during the patient's care in the ED.  This note was prepared using Dragon voice recognition software and may include unintentional dictation errors due to the inherent limitations of voice recognition software.       Final Clinical Impressions(s) / ED Diagnoses   Final diagnoses:  Cough  Intercostal pain  Viral illness    ED Discharge Orders         Ordered    ibuprofen (ADVIL) 600 MG tablet  Every 6 hours PRN     06/25/19 1649           Tanda Rockers, PA-C 06/25/19 1653    [place], Adam, DO 06/26/19 0800

## 2019-06-25 NOTE — Progress Notes (Signed)
  [facility] Internal Medicine Residency Telephone Encounter Continuity Care Appointment  HPI:   This telephone encounter was created for [name] on 06/25/2019 for the following purpose/cc cough.   Past Medical History:  Past Medical History:  Diagnosis Date  . Bronchitis   . Fibromyalgia   . Lupus ([place])   . Pleuritis 12/31/2017      ROS:  Review of Systems  Constitutional: Positive for chills. Negative for fever.  Respiratory: Positive for cough.   Cardiovascular: Positive for chest pain.  Musculoskeletal: Positive for joint pain and myalgias.  Neurological: Positive for dizziness. Negative for headaches.  Psychiatric/Behavioral: Negative for depression.     Assessment / Plan / Recommendations:   Please see A&P under problem oriented charting for assessment of the patient's acute and chronic medical conditions.   As always, pt is advised that if symptoms worsen or new symptoms arise, they should go to an urgent care facility or to to ER for further evaluation.   Consent and Medical Decision Making:   Patient discussed with Dr. Evette Doffing  This is a telephone encounter between [facility] and Diana Huynh on 06/25/2019 for cough and chest pain. The visit was conducted with the patient located at home and Diana Huynh at [facility]. The patient's identity was confirmed using their DOB and current address. The patient has consented to being evaluated through a telephone encounter and understands the associated risks (an examination cannot be done and the patient may need to come in for an appointment) / benefits (allows the patient to remain at home, decreasing exposure to coronavirus). I personally spent 8 minutes on medical discussion.

## 2019-06-25 NOTE — Assessment & Plan Note (Signed)
Patient states that she has been having cough, scratchy throat, runny nose for the past 5 days now. The cough is productive in nature and accompanied with chest pain. States that she has been having 3 day history of constant chest pain that is worsened with deep breaths and with changes in positions.  She has also had accompanied subjective night sweats, chills, lightheaded, and muscle aches. Denies post nasals drip, runny eyes.   Patient has not been going to work since March 2020. She has only been going out to grocery stores and wearing mask every time. Denies sick contacts in family.    Assessment and plan  Patient is immunocompromised secondary to SLE. Symptoms are concerning for pericardial/pleural effusion, possible PE. Less likely MI given the constant nature of the chest pain and not having significant underlying risk factors. Would assess for SARS-CoV2 as well.   -Advised patient to [facility] ED to be evaluated in person for the chest pain  -called [facility] ED charge nurse and informed about patient coming to ED

## 2019-06-25 NOTE — Discharge Instructions (Signed)
You were seen in the ED today for cough and chest pains Your labwork, EKG, and chest x ray were all very reassuring We have sent out a covid 19 test for you - this can take up to 2 days to return. Please stay home and self isolate until you receive your results. If negative you may return to your normal daily activities. If positive you will need to stay home and self isolate for 10 days starting today.      Person Under Monitoring Name: Diana Huynh  Location: [address] [place] [address]   Infection Prevention Recommendations for Individuals Confirmed to have, or Being Evaluated for, 2019 Novel Coronavirus (COVID-19) Infection Who Receive Care at Home  Individuals who are confirmed to have, or are being evaluated for, COVID-19 should follow the prevention steps below until a healthcare provider or local or state health department says they can return to normal activities.  Stay home except to get medical care You should restrict activities outside your home, except for getting medical care. Do not go to work, school, or public areas, and do not use public transportation or taxis.  Call ahead before visiting your doctor Before your medical appointment, call the healthcare provider and tell them that you have, or are being evaluated for, COVID-19 infection. This will help the healthcare providers office take steps to keep other people from getting infected. Ask your healthcare provider to call the local or state health department.  Monitor your symptoms Seek prompt medical attention if your illness is worsening (e.g., difficulty breathing). Before going to your medical appointment, call the healthcare provider and tell them that you have, or are being evaluated for, COVID-19 infection. Ask your healthcare provider to call the local or state health department.  Wear a facemask You should wear a facemask that covers your nose and mouth when you are in the same room with  other people and when you visit a healthcare provider. People who live with or visit you should also wear a facemask while they are in the same room with you.  Separate yourself from other people in your home As much as possible, you should stay in a different room from other people in your home. Also, you should use a separate bathroom, if available.  Avoid sharing household items You should not share dishes, drinking glasses, cups, eating utensils, towels, bedding, or other items with other people in your home. After using these items, you should wash them thoroughly with soap and water.  Cover your coughs and sneezes Cover your mouth and nose with a tissue when you cough or sneeze, or you can cough or sneeze into your sleeve. Throw used tissues in a lined trash can, and immediately wash your hands with soap and water for at least 20 seconds or use an alcohol-based hand rub.  Wash your [employer] your hands often and thoroughly with soap and water for at least 20 seconds. You can use an alcohol-based hand sanitizer if soap and water are not available and if your hands are not visibly dirty. Avoid touching your eyes, nose, and mouth with unwashed hands.   Prevention Steps for Caregivers and Household Members of Individuals Confirmed to have, or Being Evaluated for, COVID-19 Infection Being Cared for in the Home  If you live with, or provide care at home for, a person confirmed to have, or being evaluated for, COVID-19 infection please follow these guidelines to prevent infection:  Follow healthcare providers instructions Make  sure that you understand and can help the patient follow any healthcare provider instructions for all care.  Provide for the patients basic needs You should help the patient with basic needs in the home and provide support for getting groceries, prescriptions, and other personal needs.  Monitor the patients symptoms If they are getting sicker, call his  or her medical provider and tell them that the patient has, or is being evaluated for, COVID-19 infection. This will help the healthcare providers office take steps to keep other people from getting infected. Ask the healthcare provider to call the local or state health department.  Limit the number of people who have contact with the patient If possible, have only one caregiver for the patient. Other household members should stay in another home or place of residence. If this is not possible, they should stay in another room, or be separated from the patient as much as possible. Use a separate bathroom, if available. Restrict visitors who do not have an essential need to be in the home.  Keep older adults, very young children, and other sick people away from the patient Keep older adults, very young children, and those who have compromised immune systems or chronic health conditions away from the patient. This includes people with chronic heart, lung, or kidney conditions, diabetes, and cancer.  Ensure good ventilation Make sure that shared spaces in the home have good air flow, such as from an air conditioner or an opened window, weather permitting.  Wash your hands often Wash your hands often and thoroughly with soap and water for at least 20 seconds. You can use an alcohol based hand sanitizer if soap and water are not available and if your hands are not visibly dirty. Avoid touching your eyes, nose, and mouth with unwashed hands. Use disposable paper towels to dry your hands. If not available, use dedicated cloth towels and replace them when they become wet.  Wear a facemask and gloves Wear a disposable facemask at all times in the room and gloves when you touch or have contact with the patients blood, body fluids, and/or secretions or excretions, such as sweat, saliva, sputum, nasal mucus, vomit, urine, or feces.  Ensure the mask fits over your nose and mouth tightly, and do not touch  it during use. Throw out disposable facemasks and gloves after using them. Do not reuse. Wash your hands immediately after removing your facemask and gloves. If your personal clothing becomes contaminated, carefully remove clothing and launder. Wash your hands after handling contaminated clothing. Place all used disposable facemasks, gloves, and other waste in a lined container before disposing them with other household waste. Remove gloves and wash your hands immediately after handling these items.  Do not share dishes, glasses, or other household items with the patient Avoid sharing household items. You should not share dishes, drinking glasses, cups, eating utensils, towels, bedding, or other items with a patient who is confirmed to have, or being evaluated for, COVID-19 infection. After the person uses these items, you should wash them thoroughly with soap and water.  Wash laundry thoroughly Immediately remove and wash clothes or bedding that have blood, body fluids, and/or secretions or excretions, such as sweat, saliva, sputum, nasal mucus, vomit, urine, or feces, on them. Wear gloves when handling laundry from the patient. Read and follow directions on labels of laundry or clothing items and detergent. In general, wash and dry with the warmest temperatures recommended on the label.  Clean all areas the individual  has used often Clean all touchable surfaces, such as counters, tabletops, doorknobs, bathroom fixtures, toilets, phones, keyboards, tablets, and bedside tables, every day. Also, clean any surfaces that may have blood, body fluids, and/or secretions or excretions on them. Wear gloves when cleaning surfaces the patient has come in contact with. Use a diluted bleach solution (e.g., dilute bleach with 1 part bleach and 10 parts water) or a household disinfectant with a label that says EPA-registered for coronaviruses. To make a bleach solution at home, add 1 tablespoon of bleach to 1  quart (4 cups) of water. For a larger supply, add  cup of bleach to 1 gallon (16 cups) of water. Read labels of cleaning products and follow recommendations provided on product labels. Labels contain instructions for safe and effective use of the cleaning product including precautions you should take when applying the product, such as wearing gloves or eye protection and making sure you have good ventilation during use of the product. Remove gloves and wash hands immediately after cleaning.  Monitor yourself for signs and symptoms of illness Caregivers and household members are considered close contacts, should monitor their health, and will be asked to limit movement outside of the home to the extent possible. Follow the monitoring steps for close contacts listed on the symptom monitoring form.   ? If you have additional questions, contact your local health department or call the epidemiologist on call at [phone] (available 24/7). ? This guidance is subject to change. For the most up-to-date guidance from [facility], please refer to their website: YouBlogs.pl

## 2019-06-25 NOTE — Telephone Encounter (Signed)
I will call her thank you for letting me know

## 2019-06-25 NOTE — ED Triage Notes (Signed)
Pt reports right sided chest pain and cough for 5 days, pt negative last month for COVID. Hx of lupus and fibromyalgia. Pt a.o, nad noted.

## 2019-06-26 LAB — NOVEL CORONAVIRUS, NAA (HOSP ORDER, SEND-OUT TO REF LAB; TAT 18-24 HRS): SARS-CoV-2, NAA: NOT DETECTED

## 2019-06-26 NOTE — Progress Notes (Signed)
Internal Medicine Clinic Attending  Case discussed with Dr. Chundi at the time of the visit.  We reviewed the resident's history and exam and pertinent patient test results.  I agree with the assessment, diagnosis, and plan of care documented in the resident's note. 

## 2019-07-13 ENCOUNTER — Other Ambulatory Visit: Payer: Self-pay | Admitting: Internal Medicine

## 2019-07-13 DIAGNOSIS — M328 Other forms of systemic lupus erythematosus: Secondary | ICD-10-CM

## 2019-07-15 ENCOUNTER — Other Ambulatory Visit: Payer: Self-pay | Admitting: Internal Medicine

## 2019-07-15 DIAGNOSIS — M328 Other forms of systemic lupus erythematosus: Secondary | ICD-10-CM

## 2019-07-24 ENCOUNTER — Other Ambulatory Visit: Payer: Self-pay | Admitting: Internal Medicine

## 2019-07-24 DIAGNOSIS — M328 Other forms of systemic lupus erythematosus: Secondary | ICD-10-CM

## 2019-07-24 NOTE — Telephone Encounter (Signed)
Needs refill on cyclobenzaprine (FLEXERIL) 5 MG tablet  ;pt contact [facility] [address] [place]

## 2019-07-26 MED ORDER — CYCLOBENZAPRINE HCL 5 MG PO TABS: ORAL_TABLET | ORAL | 0 refills | Status: DC

## 2019-07-27 ENCOUNTER — Other Ambulatory Visit: Payer: Self-pay | Admitting: Internal Medicine

## 2019-07-27 DIAGNOSIS — M328 Other forms of systemic lupus erythematosus: Secondary | ICD-10-CM

## 2019-09-10 ENCOUNTER — Other Ambulatory Visit: Payer: Self-pay

## 2019-09-10 ENCOUNTER — Emergency Department (HOSPITAL_COMMUNITY): Payer: Medicaid Other

## 2019-09-10 ENCOUNTER — Emergency Department (HOSPITAL_COMMUNITY)
Admission: EM | Admit: 2019-09-10 | Discharge: 2019-09-10 | Disposition: A | Discharge status: Home / Self Care | Payer: Medicaid Other | Attending: Emergency Medicine | Admitting: Emergency Medicine

## 2019-09-10 ENCOUNTER — Encounter (HOSPITAL_COMMUNITY): Payer: Self-pay | Admitting: Emergency Medicine

## 2019-09-10 DIAGNOSIS — R079 Chest pain, unspecified: Secondary | ICD-10-CM

## 2019-09-10 DIAGNOSIS — R05 Cough: Secondary | ICD-10-CM | POA: Diagnosis not present

## 2019-09-10 DIAGNOSIS — R438 Other disturbances of smell and taste: Secondary | ICD-10-CM | POA: Insufficient documentation

## 2019-09-10 DIAGNOSIS — R0789 Other chest pain: Secondary | ICD-10-CM | POA: Insufficient documentation

## 2019-09-10 DIAGNOSIS — Z79899 Other long term (current) drug therapy: Secondary | ICD-10-CM | POA: Diagnosis not present

## 2019-09-10 DIAGNOSIS — M3210 Systemic lupus erythematosus, organ or system involvement unspecified: Secondary | ICD-10-CM | POA: Insufficient documentation

## 2019-09-10 DIAGNOSIS — Z20828 Contact with and (suspected) exposure to other viral communicable diseases: Secondary | ICD-10-CM | POA: Diagnosis not present

## 2019-09-10 DIAGNOSIS — R519 Headache, unspecified: Secondary | ICD-10-CM | POA: Insufficient documentation

## 2019-09-10 DIAGNOSIS — F17210 Nicotine dependence, cigarettes, uncomplicated: Secondary | ICD-10-CM | POA: Insufficient documentation

## 2019-09-10 LAB — BASIC METABOLIC PANEL
Anion gap: 10 (ref 5–15)
BUN: 8 mg/dL (ref 6–20)
CO2: 22 mmol/L (ref 22–32)
Calcium: 9 mg/dL (ref 8.9–10.3)
Chloride: 104 mmol/L (ref 98–111)
Creatinine, Ser: 0.84 mg/dL (ref 0.44–1.00)
GFR calc Af Amer: >60 mL/min (ref >60)
GFR calc non Af Amer: >60 mL/min (ref >60)
Glucose, Bld: 112 mg/dL — ABNORMAL HIGH (ref 70–99)
Potassium: 3.8 mmol/L (ref 3.5–5.1)
Sodium: 136 mmol/L (ref 135–145)

## 2019-09-10 LAB — CBC
HCT: 37.7 % (ref 36.0–46.0)
Hemoglobin: 12.2 g/dL (ref 12.0–15.0)
MCH: 27.9 pg (ref 26.0–34.0)
MCHC: 32.4 g/dL (ref 30.0–36.0)
MCV: 86.1 fL (ref 80.0–100.0)
Platelets: 313 10*3/uL (ref 150–400)
RBC: 4.38 MIL/uL (ref 3.87–5.11)
RDW: 15.6 % — ABNORMAL HIGH (ref 11.5–15.5)
WBC: 4.8 10*3/uL (ref 4.0–10.5)
nRBC: 0 % (ref 0.0–0.2)

## 2019-09-10 LAB — I-STAT BETA HCG BLOOD, ED (MC, WL, AP ONLY): I-stat hCG, quantitative: <5 m[IU]/mL (ref <5)

## 2019-09-10 LAB — TROPONIN I (HIGH SENSITIVITY)
Troponin I (High Sensitivity): 2 ng/L (ref <18)
Troponin I (High Sensitivity): <2 ng/L (ref <18)

## 2019-09-10 LAB — SARS CORONAVIRUS 2 (TAT 6-24 HRS): SARS Coronavirus 2: NEGATIVE

## 2019-09-10 LAB — SEDIMENTATION RATE: Sed Rate: 20 mm/hr (ref 0–22)

## 2019-09-10 MED ORDER — IBUPROFEN 400 MG PO TABS
600.0000 mg | ORAL_TABLET | Freq: Once | ORAL | Status: AC
  Administered 2019-09-10: 600 mg via ORAL
  Filled 2019-09-10: qty 1

## 2019-09-10 MED ORDER — SODIUM CHLORIDE 0.9 % IV BOLUS
1000.0000 mL | Freq: Once | INTRAVENOUS | Status: AC
  Administered 2019-09-10: 1000 mL via INTRAVENOUS

## 2019-09-10 MED ORDER — SODIUM CHLORIDE 0.9% FLUSH
3.0000 mL | Freq: Once | INTRAVENOUS | Status: AC
  Administered 2019-09-10: 19:00:00 3 mL via INTRAVENOUS

## 2019-09-10 MED ORDER — IBUPROFEN 400 MG PO TABS: 400.0000 mg | ORAL_TABLET | Freq: Once | ORAL | Status: DC

## 2019-09-10 MED ORDER — IOHEXOL 350 MG/ML SOLN
100.0000 mL | Freq: Once | INTRAVENOUS | Status: AC | PRN
  Administered 2019-09-10: 20:00:00 100 mL via INTRAVENOUS

## 2019-09-10 NOTE — ED Triage Notes (Signed)
Pt reports a sore throat, HA, right sided chest and back pain intermittently, cough and no taste. Denies any sick contacts.

## 2019-09-10 NOTE — Discharge Instructions (Addendum)
Please continue taking your home ibuprofen.  Please return to ED if you have any new concerning symptoms otherwise please follow-up with your rheumatologist and primary care doctor.  Viral Illness TREATMENT  Treatment is directed at relieving symptoms. There is no cure. Antibiotics are not effective, because the infection is caused by a virus, not by bacteria. Treatment may include:  Increased fluid intake. Sports drinks offer valuable electrolytes, sugars, and fluids.  Breathing heated mist or steam (vaporizer or shower).  Eating chicken soup or other clear broths, and maintaining good nutrition.  Getting plenty of rest.  Using gargles or lozenges for comfort.  Increasing usage of your inhaler if you have asthma.  Return to work when your temperature has returned to normal.  Gargle warm salt water and spit it out for sore throat. Take benadryl to decrease sinus secretions. Continue to alternate between Tylenol and ibuprofen for pain and fever control.  Follow Up: Follow up with your primary care doctor in 5-7 days for recheck of ongoing symptoms.  Return to emergency department for emergent changing or worsening of symptoms.      Person Under Monitoring Name: Diana Huynh  Location: [place] [address]   Infection Prevention Recommendations for Individuals Confirmed to have, or Being Evaluated for, 2019 Novel Coronavirus (COVID-19) Infection Who Receive Care at Home  Individuals who are confirmed to have, or are being evaluated for, COVID-19 should follow the prevention steps below until a healthcare provider or local or state health department says they can return to normal activities.  Stay home except to get medical care You should restrict activities outside your home, except for getting medical care. Do not go to work, school, or public areas, and do not use public transportation or taxis.  Call ahead before visiting your doctor Before your medical  appointment, call the healthcare provider and tell them that you have, or are being evaluated for, COVID-19 infection. This will help the healthcare provider's office take steps to keep other people from getting infected. Ask your healthcare provider to call the local or state health department.  Monitor your symptoms Seek prompt medical attention if your illness is worsening (e.g., difficulty breathing). Before going to your medical appointment, call the healthcare provider and tell them that you have, or are being evaluated for, COVID-19 infection. Ask your healthcare provider to call the local or state health department.  Wear a facemask You should wear a facemask that covers your nose and mouth when you are in the same room with other people and when you visit a healthcare provider. People who live with or visit you should also wear a facemask while they are in the same room with you.  Separate yourself from other people in your home As much as possible, you should stay in a different room from other people in your home. Also, you should use a separate bathroom, if available.  Avoid sharing household items You should not share dishes, drinking glasses, cups, eating utensils, towels, bedding, or other items with other people in your home. After using these items, you should wash them thoroughly with soap and water.  Cover your coughs and sneezes Cover your mouth and nose with a tissue when you cough or sneeze, or you can cough or sneeze into your sleeve. Throw used tissues in a lined trash can, and immediately wash your hands with soap and water for at least 20 seconds or use an alcohol-based hand rub.  Wash your Proofreader  your hands often and thoroughly with soap and water for at least 20 seconds. You can use an alcohol-based hand sanitizer if soap and water are not available and if your hands are not visibly dirty. Avoid touching your eyes, nose, and mouth with unwashed  hands.   Prevention Steps for Caregivers and Household Members of Individuals Confirmed to have, or Being Evaluated for, COVID-19 Infection Being Cared for in the Home  If you live with, or provide care at home for, a person confirmed to have, or being evaluated for, COVID-19 infection please follow these guidelines to prevent infection:  Follow healthcare provider's instructions Make sure that you understand and can help the patient follow any healthcare provider instructions for all care.  Provide for the patient's basic needs You should help the patient with basic needs in the home and provide support for getting groceries, prescriptions, and other personal needs.  Monitor the patient's symptoms If they are getting sicker, call his or her medical provider and tell them that the patient has, or is being evaluated for, COVID-19 infection. This will help the healthcare provider's office take steps to keep other people from getting infected. Ask the healthcare provider to call the local or state health department.  Limit the number of people who have contact with the patient If possible, have only one caregiver for the patient. Other household members should stay in another home or place of residence. If this is not possible, they should stay in another room, or be separated from the patient as much as possible. Use a separate bathroom, if available. Restrict visitors who do not have an essential need to be in the home.  Keep older adults, very young children, and other sick people away from the patient Keep older adults, very young children, and those who have compromised immune systems or chronic health conditions away from the patient. This includes people with chronic heart, lung, or kidney conditions, diabetes, and cancer.  Ensure good ventilation Make sure that shared spaces in the home have good air flow, such as from an air conditioner or an opened window, weather  permitting.  Wash your hands often Wash your hands often and thoroughly with soap and water for at least 20 seconds. You can use an alcohol based hand sanitizer if soap and water are not available and if your hands are not visibly dirty. Avoid touching your eyes, nose, and mouth with unwashed hands. Use disposable paper towels to dry your hands. If not available, use dedicated cloth towels and replace them when they become wet.  Wear a facemask and gloves Wear a disposable facemask at all times in the room and gloves when you touch or have contact with the patient's blood, body fluids, and/or secretions or excretions, such as sweat, saliva, sputum, nasal mucus, vomit, urine, or feces.  Ensure the mask fits over your nose and mouth tightly, and do not touch it during use. Throw out disposable facemasks and gloves after using them. Do not reuse. Wash your hands immediately after removing your facemask and gloves. If your personal clothing becomes contaminated, carefully remove clothing and launder. Wash your hands after handling contaminated clothing. Place all used disposable facemasks, gloves, and other waste in a lined container before disposing them with other household waste. Remove gloves and wash your hands immediately after handling these items.  Do not share dishes, glasses, or other household items with the patient Avoid sharing household items. You should not share dishes, drinking glasses, cups, eating utensils,  towels, bedding, or other items with a patient who is confirmed to have, or being evaluated for, COVID-19 infection. After the person uses these items, you should wash them thoroughly with soap and water.  Wash laundry thoroughly Immediately remove and wash clothes or bedding that have blood, body fluids, and/or secretions or excretions, such as sweat, saliva, sputum, nasal mucus, vomit, urine, or feces, on them. Wear gloves when handling laundry from the patient. Read and  follow directions on labels of laundry or clothing items and detergent. In general, wash and dry with the warmest temperatures recommended on the label.  Clean all areas the individual has used often Clean all touchable surfaces, such as counters, tabletops, doorknobs, bathroom fixtures, toilets, phones, keyboards, tablets, and bedside tables, every day. Also, clean any surfaces that may have blood, body fluids, and/or secretions or excretions on them. Wear gloves when cleaning surfaces the patient has come in contact with. Use a diluted bleach solution (e.g., dilute bleach with 1 part bleach and 10 parts water) or a household disinfectant with a label that says EPA-registered for coronaviruses. To make a bleach solution at home, add 1 tablespoon of bleach to 1 quart (4 cups) of water. For a larger supply, add  cup of bleach to 1 gallon (16 cups) of water. Read labels of cleaning products and follow recommendations provided on product labels. Labels contain instructions for safe and effective use of the cleaning product including precautions you should take when applying the product, such as wearing gloves or eye protection and making sure you have good ventilation during use of the product. Remove gloves and wash hands immediately after cleaning.  Monitor yourself for signs and symptoms of illness Caregivers and household members are considered close contacts, should monitor their health, and will be asked to limit movement outside of the home to the extent possible. Follow the monitoring steps for close contacts listed on the symptom monitoring form.   ? If you have additional questions, contact your local health department or call the epidemiologist on call at [phone] (available 24/7). ? This guidance is subject to change. For the most up-to-date guidance from [facility], please refer to their website: [email or web address]

## 2019-09-10 NOTE — ED Provider Notes (Signed)
[facility] EMERGENCY DEPARTMENT Provider Note   CSN: [id number] Arrival date & time: 09/10/19  1442     History Chief Complaint  Patient presents with  . Chest Pain    Diana Huynh is a 38 y.o. female Patient has hx of SLE and fibromyalgia on  Plaquenil  HPI Cough, congestion, decreased sense of smell and taste. Right sided chest pain. Also intermittent right back pain. Decreased appetite due to having decreased taste. Headache that is slight but comes and goes. States temperature was 99.0 today which prompted her to present to the ED. Also endorses fatigue and SOB as well.     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 States most recently had flare up in May. Small effusion noted most recently on 02/05/19.  Patient states that she sees  States no covid contacts that she knows of. Has had cough for several months. States she's had similar episodes the past year of being sick with all of these symptoms.   No recent prolonged travel or immobilization.  History of DVT/PE.  No OCPs.  No hemoptysis.   On my review of EMR patient was hospitalized for similar symptoms 02/07/2019 At that time she had a CT angiogram that was negative for PE but showed small pericardial effusion.  Had negative cardiology work-up which did not show signs of pericarditis on echo.  Was noted to be flare of fibromyalgia versus lupus flare.  Symptoms improved spontaneously and was discharged.  9/24 patient presented with chest pain proved with ibuprofen.  Patient also had negative CT PE studies in March and May.       Past Medical History:  Diagnosis Date  . Bronchitis   . Fibromyalgia   . Lupus ([place])   . Pleuritis 12/31/2017    Patient Active Problem List   Diagnosis Date Noted  . GERD (gastroesophageal reflux disease) 02/12/2019  . Pulmonary nodule less than 6 cm determined by computed tomography of lung 02/12/2019  . Precordial chest pain 02/05/2019  . Cough 01/06/2019   . Healthcare maintenance 07/25/2018  . Fibromyalgia 06/25/2018  . Acute non-recurrent frontal sinusitis 02/10/2018  . Viral pericarditis 01/06/2018  . SLE (systemic lupus erythematosus) ([place]) 12/31/2017  . Pericardial effusion     Past Surgical History:  Procedure Laterality Date  . ANKLE SURGERY    . APPENDECTOMY    . CESAREAN SECTION       OB History   No obstetric history on file.     Family History  Problem Relation Age of Onset  . Hypertension Mother   . Heart murmur Mother   . Healthy Father   . Thyroid disease Sister   . Breast cancer Paternal Grandmother   . Breast cancer Paternal Aunt   . Heart failure Sister        has ICD  . Cervical cancer Sister     Social History   Tobacco Use  . Smoking status: Current Every Day Smoker    Packs/day: 0.25    Types: Cigarettes    Last attempt to quit: 09/20/2018    Years since quitting: 0.9  . Smokeless tobacco: Never Used  . Tobacco comment: 4-5 cigs/day.  Substance Use Topics  . Alcohol use: Yes  . Drug use: No    Home Medications Prior to Admission medications   Medication Sig Start Date End Date Taking? Authorizing Provider  alum & mag hydroxide-simeth (MAALOX/MYLANTA) 200-200-20 MG/5ML suspension Take 15 mLs by mouth every 6 (six) hours as  needed for indigestion or heartburn.    [provider]  benzonatate (TESSALON) 100 MG capsule Take 1 capsule (100 mg total) by mouth every 8 (eight) hours. 06/25/19   Alroy Bailiff, Margaux, PA-C  cyclobenzaprine (FLEXERIL) 5 MG tablet TAKE ONE TABLET BY MOUTH THREE TIMES A DAY AS NEEDED FOR MUSCLE SPASMS 07/26/19   Chundi, Verne Spurr, MD  DULoxetine (CYMBALTA) 30 MG capsule Take 1 capsule (30 mg total) by mouth daily for 5 days, THEN 2 capsules (60 mg total) daily for 21 days. 02/09/19   Katherine Roan, MD  famotidine (PEPCID) 20 MG tablet Take 20 mg by mouth as needed.     [provider]  gabapentin (NEURONTIN) 300 MG capsule Take 900 mg by mouth daily.     [provider]  hydroxychloroquine (PLAQUENIL) 200 MG tablet Take 492m every other day alternating with 400 mg    [provider]  ibuprofen (ADVIL) 600 MG tablet Take 1 tablet (600 mg total) by mouth every 6 (six) hours as needed. 06/25/19   VEustaquio Maize PA-C  omeprazole (PRILOSEC) 40 MG capsule Take 1 capsule (40 mg total) by mouth 2 (two) times daily. 02/08/19   WKatherine Roan MD    Allergies    Patient has no known allergies.  Review of Systems   Review of Systems  Constitutional: Positive for appetite change, chills and fever.  HENT: Positive for congestion.   Respiratory: Positive for cough, chest tightness and shortness of breath.   Cardiovascular: Positive for chest pain.  Gastrointestinal: Negative for abdominal pain, constipation, nausea and vomiting.  Genitourinary: Negative for dysuria.  Musculoskeletal: Positive for back pain. Negative for neck pain and neck stiffness.  Neurological: Positive for headaches.  All other systems reviewed and are negative.   Physical Exam Updated Vital Signs BP (!) 157/85 (BP Location: Left Arm)   Pulse 80   Temp 98.9 F (37.2 C) (Oral)   Resp 17   Ht '5\' 1"'  (1.549 m)   Wt 81.6 kg   LMP 08/12/2019   SpO2 100%   BMI 34.01 kg/m   Physical Exam Vitals and nursing note reviewed.  Constitutional:      General: She is in acute distress.     Appearance: She is not ill-appearing.     Comments: Patient appears to be in acute distress is clutching her chest  HENT:     Head: Normocephalic and atraumatic.     Nose: Nose normal.  Eyes:     General: No scleral icterus. Cardiovascular:     Rate and Rhythm: Regular rhythm. Tachycardia present.     Pulses: Normal pulses.     Heart sounds: Normal heart sounds.  Pulmonary:     Breath sounds: No wheezing.     Comments: Patient has mildly increased work of effort of breathing.  No abnormal lung sounds.  Is able to speak in full sentences but takes deep breaths in  between.  Is leaning forward for comfort.  Is not tripoding in order to breathe. Abdominal:     Palpations: Abdomen is soft.     Tenderness: There is no abdominal tenderness.  Musculoskeletal:     Cervical back: Normal range of motion.     Right lower leg: No edema.     Left lower leg: No edema.     Comments: Anterior chest wall tenderness to palpation.  Skin:    General: Skin is warm and dry.     Capillary Refill: Capillary refill takes less than 2  seconds.  Neurological:     Mental Status: She is alert. Mental status is at baseline.  Psychiatric:        Mood and Affect: Mood normal.        Behavior: Behavior normal.     ED Results / Procedures / Treatments   Labs (all labs ordered are listed, but only abnormal results are displayed) Labs Reviewed  BASIC METABOLIC PANEL - Abnormal; Notable for the following components:      Result Value   Glucose, Bld 112 (*)    All other components within normal limits  CBC - Abnormal; Notable for the following components:   RDW 15.6 (*)    All other components within normal limits  SARS CORONAVIRUS 2 (TAT 6-24 HRS)  SEDIMENTATION RATE  C-REACTIVE PROTEIN  I-STAT BETA HCG BLOOD, ED ([facility], [facility], [facility] ONLY)  TROPONIN I (HIGH SENSITIVITY)  TROPONIN I (HIGH SENSITIVITY)    EKG EKG Interpretation  Date/Time:  Thursday September 10 2019 16:21:03 EST Ventricular Rate:  104 PR Interval:    QRS Duration: 69 QT Interval:  374 QTC Calculation: 492 R Axis:   90 Text Interpretation: Sinus tachycardia Anteroseptal infarct, age indeterminate When compared to prior, no significant changes seen. No STEMI Confirmed by Antony Blackbird [phone]) on 09/10/2019 5:53:06 PM   Radiology CT Angio Chest PE W/Cm &/Or Wo Cm  Result Date: 09/10/2019 CLINICAL DATA:  Sore throat, headaches and right chest pain. EXAM: CT ANGIOGRAPHY CHEST WITH CONTRAST TECHNIQUE: Multidetector CT imaging of the chest was performed using the standard protocol during bolus  administration of intravenous contrast. Multiplanar CT image reconstructions and MIPs were obtained to evaluate the vascular anatomy. CONTRAST:  24m OMNIPAQUE IOHEXOL 350 MG/ML SOLN COMPARISON:  Feb 05, 2019 FINDINGS: Cardiovascular: Satisfactory opacification of the pulmonary arteries to the segmental level. No evidence of pulmonary embolism. Normal heart size. No pericardial effusion. Mediastinum/Nodes: No enlarged mediastinal, hilar, or axillary lymph nodes. Thyroid gland, trachea, and esophagus demonstrate no significant findings. Lungs/Pleura: Lungs are clear. No pleural effusion or pneumothorax. Upper Abdomen: No acute abnormality. Musculoskeletal: No acute abnormality. Review of the MIP images confirms the above findings. IMPRESSION: 1. No pulmonary embolus. 2. No acute abnormality identified within the chest. Electronically Signed   By: WAbelardo DieselM.D.   On: 09/10/2019 19:53   DG Chest Port 1 View  Result Date: 09/10/2019 CLINICAL DATA:  Chest pain, sore throat, headache EXAM: PORTABLE CHEST 1 VIEW COMPARISON:  Radiograph 06/25/2019, CTA chest 02/05/2019 FINDINGS: No consolidation, features of edema, pneumothorax, or effusion. Pulmonary vascularity is normally distributed. The cardiomediastinal contours are unremarkable. No acute osseous or soft tissue abnormality. IMPRESSION: No acute cardiopulmonary abnormality. Electronically Signed   By: PLovena LeM.D.   On: 09/10/2019 17:01    Procedures Procedures (including critical care time)  Medications Ordered in ED Medications  sodium chloride flush (NS) 0.9 % injection 3 mL (3 mLs Intravenous Given 09/10/19 1844)  ibuprofen (ADVIL) tablet 600 mg (600 mg Oral Given 09/10/19 1842)  sodium chloride 0.9 % bolus 1,000 mL (1,000 mLs Intravenous New Bag/Given 09/10/19 1842)  iohexol (OMNIPAQUE) 350 MG/ML injection 100 mL (100 mLs Intravenous Contrast Given 09/10/19 1946)    ED Course  I have reviewed the triage vital signs and the nursing  notes.  Pertinent labs & imaging results that were available during my care of the patient were reviewed by me and considered in my medical decision making (see chart for details).    MDM Rules/Calculators/A&P  Patient presents today as a 38 year old female with SLE and fibromyalgia with chest pain that is pleuritic positional worse with lying backwards better with sitting forward.  Has a history of pericarditis and pericardial effusion.  States that she has not been taking her ibuprofen as prescribed if she has been feeling ill recently.  Patient states she has cough, sore throat, headache, decreased taste.  Suspect the patient has COVID-19 if she has symptoms of such or other acute upper respiratory tract infection.  However as her chest pain is severe and concerning for pulmonary embolism with increased risk due to her SLE will conduct PE study to rule out.  Patient has chronically elevated D-dimers and I do not believe this will be useful lab today.  We will obtain CBC, BMP, Covid, chest x-ray, EKG, ESR/CRP  Sed rate 20 which is within normal limits and decreased from prior presentations.  Initial troponin to.  BMP without abnormality, CBC without abnormality.  Covid test pending.  Patient is not pregnant.  EKG is nonischemic chest x-ray is without acute abnormalities such as infiltrate, bony abnormality or evidence of acute process.  CT without evidence of pericardial effusion, no pulmonary embolisms.  No pneumonia, infiltrate, infarct or acute abnormality.  Troponin x2 within normal limits.  On my reassessment patient is feeling considerably better.  Tachycardia has resolved with 1 L of fluids and ibuprofen.  Suspect the patient has upper respiratory tract infection and chronic cough causing her chest pain.  Pulmonary embolism, pericarditis, pulmonary effusion, pericardial effusion low probability.  Will recommend patient continue to use ibuprofen as she has history  pericarditis however doubt that this is causing her chest pain today.  Patient given strict return precautions.  Vitals are now within normal limits apart from mildly elevated blood pressure.  Patient is saturating present in room air no longer tachycardic or tachypneic.  I discussed this case with my attending physician who cosigned this note including patient's presenting symptoms, physical exam, and planned diagnostics and interventions. Attending physician stated agreement with plan or made changes to plan which were implemented.     This patient appears reasonably screened and I doubt any other medical condition requiring further workup, evaluation, or treatment in the ED at this time prior to discharge.   Patient's vitals are WNL apart from vital sign abnormalities discussed above, patient is in NAD, and able to ambulate in the ED at their baseline. Pain has been managed or a plan has been made for home management and has no complaints prior to discharge. Patient is comfortable with above plan and is stable for discharge at this time. All questions were answered prior to disposition. Results from the ER workup discussed with the patient face to face and all questions answered to the Jamar of my ability. The patient is safe for discharge with strict return precautions. Patient appears safe for discharge with appropriate follow-up. Conveyed my impression with the patient and they voiced understanding and are agreeable to plan.    Patient has no history of diabetes, hyperlipidemia, is overweight but not obese, has no history of first-degree relative with heart attack before the age of 67.  Patient is low none for scoring and 2 - troponins and nonischemic EKG doubt cardiac etiology will recommend patient follow-up with PCP and rheumatologist.  Suspect that this is fibromyalgia pain or musculoskeletal.  Patient is agreeable of plan feels much improved after treatment.  An After Visit Summary was  printed and given to the patient.  Portions  of this note were generated with Lobbyist. Dictation errors may occur despite Eisen attempts at proofreading.   Diana Huynh was evaluated in Emergency Department on 09/10/2019 for the symptoms described in the history of present illness. She was evaluated in the context of the global COVID-19 pandemic, which necessitated consideration that the patient might be at risk for infection with the SARS-CoV-2 virus that causes COVID-19. Institutional protocols and algorithms that pertain to the evaluation of patients at risk for COVID-19 are in a state of rapid change based on information released by regulatory bodies including the CDC and federal and state organizations. These policies and algorithms were followed during the patient's care in the ED.   Final Clinical Impression(s) / ED Diagnoses Final diagnoses:  Chest pain, unspecified type    Rx / DC Orders ED Discharge Orders    None       Tedd Sias, [place] 09/10/19 2005    Lucrezia Starch, MD 09/11/19 [phone]

## 2019-09-10 NOTE — ED Notes (Signed)
Patient verbalizes understanding of discharge instructions. Opportunity for questioning and answers were provided. Armband removed by staff, pt discharged from ED.  

## 2020-01-20 ENCOUNTER — Ambulatory Visit: Payer: Medicaid Other | Admitting: Internal Medicine

## 2020-01-20 ENCOUNTER — Encounter (HOSPITAL_COMMUNITY): Payer: Self-pay

## 2020-01-20 ENCOUNTER — Other Ambulatory Visit: Payer: Self-pay

## 2020-01-20 ENCOUNTER — Emergency Department (HOSPITAL_COMMUNITY): Payer: Medicaid Other

## 2020-01-20 ENCOUNTER — Encounter: Payer: Self-pay | Admitting: Internal Medicine

## 2020-01-20 ENCOUNTER — Emergency Department (HOSPITAL_COMMUNITY)
Admission: EM | Admit: 2020-01-20 | Discharge: 2020-01-20 | Disposition: A | Discharge status: Home / Self Care | Payer: Medicaid Other | Attending: Emergency Medicine | Admitting: Emergency Medicine

## 2020-01-20 VITALS — BP 155/83 | HR 95 | Temp 98.0°F | Wt 187.8 lb

## 2020-01-20 DIAGNOSIS — Z720 Tobacco use: Secondary | ICD-10-CM

## 2020-01-20 DIAGNOSIS — R0781 Pleurodynia: Secondary | ICD-10-CM | POA: Diagnosis not present

## 2020-01-20 DIAGNOSIS — R079 Chest pain, unspecified: Secondary | ICD-10-CM

## 2020-01-20 DIAGNOSIS — R042 Hemoptysis: Secondary | ICD-10-CM

## 2020-01-20 DIAGNOSIS — I7300 Raynaud's syndrome without gangrene: Secondary | ICD-10-CM | POA: Diagnosis not present

## 2020-01-20 DIAGNOSIS — M791 Myalgia, unspecified site: Secondary | ICD-10-CM | POA: Diagnosis not present

## 2020-01-20 DIAGNOSIS — M329 Systemic lupus erythematosus, unspecified: Secondary | ICD-10-CM

## 2020-01-20 DIAGNOSIS — F17210 Nicotine dependence, cigarettes, uncomplicated: Secondary | ICD-10-CM | POA: Diagnosis not present

## 2020-01-20 DIAGNOSIS — R0789 Other chest pain: Secondary | ICD-10-CM | POA: Diagnosis present

## 2020-01-20 DIAGNOSIS — Z79899 Other long term (current) drug therapy: Secondary | ICD-10-CM | POA: Diagnosis not present

## 2020-01-20 LAB — BASIC METABOLIC PANEL
Anion gap: 11 (ref 5–15)
BUN: 7 mg/dL (ref 6–20)
CO2: 25 mmol/L (ref 22–32)
Calcium: 8.9 mg/dL (ref 8.9–10.3)
Chloride: 99 mmol/L (ref 98–111)
Creatinine, Ser: 0.89 mg/dL (ref 0.44–1.00)
GFR calc Af Amer: >60 mL/min (ref >60)
GFR calc non Af Amer: >60 mL/min (ref >60)
Glucose, Bld: 103 mg/dL — ABNORMAL HIGH (ref 70–99)
Potassium: 4.7 mmol/L (ref 3.5–5.1)
Sodium: 135 mmol/L (ref 135–145)

## 2020-01-20 LAB — CBC
HCT: 38.6 % (ref 36.0–46.0)
Hemoglobin: 12.6 g/dL (ref 12.0–15.0)
MCH: 28.4 pg (ref 26.0–34.0)
MCHC: 32.6 g/dL (ref 30.0–36.0)
MCV: 87.1 fL (ref 80.0–100.0)
Platelets: 264 10*3/uL (ref 150–400)
RBC: 4.43 MIL/uL (ref 3.87–5.11)
RDW: 16.1 % — ABNORMAL HIGH (ref 11.5–15.5)
WBC: 6.4 10*3/uL (ref 4.0–10.5)
nRBC: 0 % (ref 0.0–0.2)

## 2020-01-20 LAB — TROPONIN I (HIGH SENSITIVITY)
Troponin I (High Sensitivity): <2 ng/L (ref <18)
Troponin I (High Sensitivity): 2 ng/L (ref <18)

## 2020-01-20 LAB — I-STAT BETA HCG BLOOD, ED (MC, WL, AP ONLY): I-stat hCG, quantitative: <5 m[IU]/mL (ref <5)

## 2020-01-20 MED ORDER — ONDANSETRON HCL 4 MG/2ML IJ SOLN
4.0000 mg | Freq: Once | INTRAMUSCULAR | Status: AC
  Administered 2020-01-20: 4 mg via INTRAVENOUS
  Filled 2020-01-20: qty 2

## 2020-01-20 MED ORDER — CYCLOBENZAPRINE HCL 10 MG PO TABS: 10.0000 mg | ORAL_TABLET | Freq: Every day | ORAL | 0 refills | Status: DC

## 2020-01-20 MED ORDER — HYDROMORPHONE HCL 1 MG/ML IJ SOLN: 1.0000 mg | Freq: Once | INTRAMUSCULAR | Status: DC

## 2020-01-20 MED ORDER — IOHEXOL 350 MG/ML SOLN
80.0000 mL | Freq: Once | INTRAVENOUS | Status: AC | PRN
  Administered 2020-01-20: 80 mL via INTRAVENOUS

## 2020-01-20 MED ORDER — MORPHINE SULFATE (PF) 4 MG/ML IV SOLN
4.0000 mg | Freq: Once | INTRAVENOUS | Status: AC
  Administered 2020-01-20: 4 mg via INTRAVENOUS
  Filled 2020-01-20: qty 1

## 2020-01-20 NOTE — ED Triage Notes (Signed)
Pt reports right sided chest pain for the past week and started coughing up bright red blood 2 days ago, pt denies fever or chills. Resp e.u at this time, no coughing noted in triage.

## 2020-01-20 NOTE — Progress Notes (Signed)
[facility] Medicine Clinic Attending  Case discussed with Dr. Winfrey  at the time of the visit.  We reviewed the resident's history and exam and pertinent patient test results.  I agree with the assessment, diagnosis, and plan of care documented in the resident's note.  

## 2020-01-20 NOTE — Discharge Instructions (Signed)
Per discussion, I am prescribing you Flexeril for management of your pain.  Please keep in mind that this medication is sedating so do not operate a motor vehicle or heavy machinery after taking this medication.  Please do not combine this medication with alcohol.  Please do not hesitate to return to the emergency department with any new or worsening symptoms.  Please follow-up with your primary care provider and rheumatologist regarding this visit.

## 2020-01-20 NOTE — ED Provider Notes (Signed)
[facility] EMERGENCY DEPARTMENT Provider Note   CSN: [id number] Arrival date & time: 01/20/20  1035     History Chief Complaint  Patient presents with  . Chest Pain  . Hemoptysis    Girl Schissler is a 39 y.o. female with past medical history significant for bronchitis, fibromyalgia, lupus, tobacco abuse presents to emergency room today with chief complaint of chest pain and hemoptysis x2 days.  Patient states she has coughed up 4 blood clots.  She went to her PCP this morning and was advised to go to the emergency department for evaluation.  Patient is describing the chest pain as located underneath her right breast radiating to her right arm.  She states the pain is worse when taking a deep breath.  She describes the pain as severe.  She is also endorsing pain all over her body that is consistent with her fibromyalgia. She has not taken any medications for symptoms prior to arrival.  Denies history of esophageal varices, denies alcohol use. She denies weight loss, fatigue, fever, chills, syncope, palpitations, back pain, abdominal pain, nausea, vomiting, urinary symptoms, diarrhea, lower extremity edema, rash. Patient is not anticoagulated. No history of PE. Cardiac family history includes her father having hypertension.   History provided by patient with additional history obtained from chart review.     Past Medical History:  Diagnosis Date  . Bronchitis   . Fibromyalgia   . Lupus ([facility])   . Pleuritis 12/31/2017    Patient Active Problem List   Diagnosis Date Noted  . Right-sided chest pain 01/20/2020  . Hemoptysis 01/20/2020  . GERD (gastroesophageal reflux disease) 02/12/2019  . Pulmonary nodule less than 6 cm determined by computed tomography of lung 02/12/2019  . Precordial chest pain 02/05/2019  . Cough 01/06/2019  . Healthcare maintenance 07/25/2018  . Fibromyalgia 06/25/2018  . Acute non-recurrent frontal sinusitis 02/10/2018  . Viral pericarditis  01/06/2018  . SLE (systemic lupus erythematosus) ([facility]) 12/31/2017  . Pericardial effusion     Past Surgical History:  Procedure Laterality Date  . ANKLE SURGERY    . APPENDECTOMY    . CESAREAN SECTION       OB History   No obstetric history on file.     Family History  Problem Relation Age of Onset  . Hypertension Mother   . Heart murmur Mother   . Healthy Father   . Thyroid disease Sister   . Breast cancer Paternal Grandmother   . Breast cancer Paternal Aunt   . Heart failure Sister        has ICD  . Cervical cancer Sister     Social History   Tobacco Use  . Smoking status: Current Every Day Smoker    Packs/day: 0.25    Types: Cigarettes    Last attempt to quit: 09/20/2018    Years since quitting: 1.3  . Smokeless tobacco: Never Used  . Tobacco comment: 4-5 cigs/day.  Substance Use Topics  . Alcohol use: Yes  . Drug use: No    Home Medications Prior to Admission medications   Medication Sig Start Date End Date Taking? Authorizing Provider  alum & mag hydroxide-simeth (MAALOX/MYLANTA) 200-200-20 MG/5ML suspension Take 15 mLs by mouth every 6 (six) hours as needed for indigestion or heartburn.   Yes [provider]  benzonatate (TESSALON) 100 MG capsule Take 1 capsule (100 mg total) by mouth every 8 (eight) hours. 06/25/19  Yes Venter, Margaux, PA-C  cyclobenzaprine (FLEXERIL) 5 MG tablet TAKE ONE  TABLET BY MOUTH THREE TIMES A DAY AS NEEDED FOR MUSCLE SPASMS Patient taking differently: Take 5 mg by mouth 3 (three) times daily as needed for muscle spasms.  07/26/19  Yes Chundi, Vahini, MD  DULoxetine (CYMBALTA) 60 MG capsule Take 60 mg by mouth daily. 10/01/19  Yes [provider]  famotidine (PEPCID) 20 MG tablet Take 20 mg by mouth daily.    Yes [provider]  gabapentin (NEURONTIN) 300 MG capsule Take 900 mg by mouth daily.   Yes [provider]  hydroxychloroquine (PLAQUENIL) 200 MG tablet Take 200-400 mg by mouth daily.  Take 200mg  every other day alternating with 400 mg   Yes [provider]  ibuprofen (ADVIL) 600 MG tablet Take 1 tablet (600 mg total) by mouth every 6 (six) hours as needed. 06/25/19  Yes Venter, Margaux, PA-C  DULoxetine (CYMBALTA) 30 MG capsule Take 1 capsule (30 mg total) by mouth daily for 5 days, THEN 2 capsules (60 mg total) daily for 21 days. Patient not taking: Reported on 01/20/2020 02/09/19   04/11/19, MD    Allergies    Patient has no known allergies.  Review of Systems   Review of Systems All other systems are reviewed and are negative for acute change except as noted in the HPI.  Physical Exam Updated Vital Signs BP (!) 151/89   Pulse 71   Temp 98.8 F (37.1 C) (Oral)   Resp 12   Ht 5\' 1"  (1.549 m)   Wt 85.2 kg   SpO2 100%   BMI 35.48 kg/m   Physical Exam Vitals and nursing note reviewed.  Constitutional:      General: She is not in acute distress.    Appearance: She is not ill-appearing.  HENT:     Head: Normocephalic and atraumatic.     Right Ear: Tympanic membrane and external ear normal.     Left Ear: Tympanic membrane and external ear normal.     Nose: Nose normal.     Mouth/Throat:     Mouth: Mucous membranes are moist.     Pharynx: Oropharynx is clear. Uvula midline.     Comments: Minor erythema to oropharynx, no edema, voice normal, neck supple without lymphadenopathy  Eyes:     General: No scleral icterus.       Right eye: No discharge.        Left eye: No discharge.     Extraocular Movements: Extraocular movements intact.     Conjunctiva/sclera: Conjunctivae normal.     Pupils: Pupils are equal, round, and reactive to light.  Neck:     Vascular: No JVD.  Cardiovascular:     Rate and Rhythm: Normal rate and regular rhythm.     Pulses: Normal pulses.          Radial pulses are 2+ on the right side and 2+ on the left side.     Heart sounds: Normal heart sounds.  Pulmonary:     Comments: Lungs clear to auscultation in all  fields. Symmetric chest rise. No wheezing, rales, or rhonchi.  Patient saturation is 100% on room air. Chest:       Comments: Tenderness to palpation as depicted in image above.  No overlying skin changes. No crepitus or deformity. No flail chest Abdominal:     Comments: Abdomen is soft, non-distended, and non-tender in all quadrants. No rigidity, no guarding. No peritoneal signs.  Musculoskeletal:        General: Normal range of motion.  Cervical back: Normal range of motion.     Comments: Homans sign absent bilaterally, no lower extremity edema, no palpable cords, compartments are soft   Skin:    General: Skin is warm and dry.     Capillary Refill: Capillary refill takes less than 2 seconds.     Findings: No rash.  Neurological:     Mental Status: She is oriented to person, place, and time.     GCS: GCS eye subscore is 4. GCS verbal subscore is 5. GCS motor subscore is 6.     Comments: Fluent speech, no facial droop.  Psychiatric:        Behavior: Behavior normal.     ED Results / Procedures / Treatments   Labs (all labs ordered are listed, but only abnormal results are displayed) Labs Reviewed  BASIC METABOLIC PANEL - Abnormal; Notable for the following components:      Result Value   Glucose, Bld 103 (*)    All other components within normal limits  CBC - Abnormal; Notable for the following components:   RDW 16.1 (*)    All other components within normal limits  I-STAT BETA HCG BLOOD, ED ([facility], [facility], [facility] ONLY)  TROPONIN I (HIGH SENSITIVITY)  TROPONIN I (HIGH SENSITIVITY)    EKG None  Radiology DG Chest 2 View  Result Date: 01/20/2020 CLINICAL DATA:  Right chest and arm pain for several days. EXAM: CHEST - 2 VIEW COMPARISON:  09/10/2019 FINDINGS: Normal heart, mediastinum and hila. Clear lungs.  No pleural effusion or pneumothorax. Skeletal structures within normal limits. IMPRESSION: Normal chest radiographs. Electronically Signed   By: Amie Portland M.D.   On:  01/20/2020 11:16    Procedures Procedures (including critical care time)  Medications Ordered in ED Medications  morphine 4 MG/ML injection 4 mg (4 mg Intravenous Given 01/20/20 1442)  ondansetron (ZOFRAN) injection 4 mg (4 mg Intravenous Given 01/20/20 1441)    ED Course  I have reviewed the triage vital signs and the nursing notes.  Pertinent labs & imaging results that were available during my care of the patient were reviewed by me and considered in my medical decision making (see chart for details).    MDM Rules/Calculators/A&P                      Patient seen and examined. Patient presents awake, alert, hemodynamically stable, afebrile, non toxic.  No tachycardia or hypoxia.  On exam she has tenderness to palpation underneath right breast.  No overlying skin changes no rash.  Lungs are clear to auscultation all fields.  She has normal work of breathing.  No lower extremity edema.  Negative Homans' sign bilaterally. DDx also includes pericarditis lupus, lupus flare, pneumonia, malignancy. Labs were collected in triage.  I viewed results.  No leukocytosis, no anemia.  No severe electrolyte derangement, no renal insufficiency.  First troponin is undetectable.  Pregnancy test is negative.  Chest x-ray viewed by me without any infectious processes seen.  EKG without ischemia. HEAR score is 1.   Given patient's concerning symptoms for PE will CTA chest.    Patient care transferred to L. Joldersma PA-C at the end of my shift. Patient presentation, ED course, and plan of care discussed with review of all pertinent labs and imaging. Please see his/her note for further details regarding further ED course and disposition.   Portions of this note were generated with Scientist, clinical (histocompatibility and immunogenetics). Dictation errors may occur despite Wicke attempts at  proofreading.   Final Clinical Impression(s) / ED Diagnoses Final diagnoses:  None    Rx / DC Orders ED Discharge Orders    None         Kathyrn Lass 01/20/20 1545    Long, Arlyss Repress, MD 01/20/20 [phone]

## 2020-01-20 NOTE — ED Provider Notes (Signed)
Patient is a 39 year old female for which I assumed care of during shift change from PA-C Albrizze.  For more detailed history please see additional note on the patient below.  Patient reports 5 days of significant right-sided rib pain as well as pain just inferior to the right breast.  She reports an episode which she had a productive cough 2 days ago with blood in the sputum.  She was being evaluated by her primary care provider earlier today who then sent her to the emergency department for further evaluation.  She states her pain feels somewhat similar to prior lupus flares as well as fibromyalgia flares.  She additionally notes diffuse pain all over her body, though this is not as significant as the pain along her right ribs.  She is a smoker.  She takes hydroxychloroquine for lupus.  Physical Exam  BP (!) 151/89   Pulse 71   Temp 98.8 F (37.1 C) (Oral)   Resp 12   Ht 5\' 1"  (1.549 m)   Wt 85.2 kg   SpO2 100%   BMI 35.48 kg/m   Physical Exam Vitals and nursing note reviewed.  Constitutional:      General: She is not in acute distress.    Appearance: She is well-developed. She is not ill-appearing, toxic-appearing or diaphoretic.  HENT:     Head: Normocephalic and atraumatic.  Eyes:     Extraocular Movements: Extraocular movements intact.  Cardiovascular:     Rate and Rhythm: Normal rate and regular rhythm.  No extrasystoles are present.    Pulses:          Radial pulses are 2+ on the right side and 2+ on the left side.     Heart sounds: Normal heart sounds.  Pulmonary:     Effort: Pulmonary effort is normal. No tachypnea, accessory muscle usage or respiratory distress.     Breath sounds: Normal breath sounds. No stridor. No decreased breath sounds, wheezing, rhonchi or rales.     Comments: Moderate TTP noted to the right lateral ribs diffusely as well as inferior to the right breast. Musculoskeletal:        General: Normal range of motion.     Cervical back: Normal range of  motion.  Skin:    General: Skin is warm and dry.  Neurological:     General: No focal deficit present.     Mental Status: She is alert and oriented to person, place, and time.  Psychiatric:        Mood and Affect: Mood normal.        Behavior: Behavior normal.    ED Course/Procedures     Procedures  MDM  Patient is a 39 year old female with 5 days of atraumatic right rib pain.  Physical exam is reassuring.  Patient care was transferred to me from prior PA-C at shift change.  Her labs and chest x-ray are reassuring.  Physical exam is reassuring.  CT angiogram was ordered of the chest for PE rule out.  This was negative.  No acute findings noted.  I discussed this with the patient.  She states she typically takes ibuprofen, gabapentin, Flexeril for pain management.  She states she is out of Flexeril.  Will give patient a short course of Flexeril.  She understands to follow-up with her primary care provider again tomorrow to discuss his visit.  She states she is additionally going to reach out to her rheumatologist tomorrow regarding this visit.  Patient was given strict return precautions  and she understands return to the emergency department any new or worsening symptoms.  She was amicable with the above plan.  Vital signs stable.  Patient discharged to home/self care.  Condition at discharge: Stable  Note: Portions of this report may have been transcribed using voice recognition software. Every effort was made to ensure accuracy; however, inadvertent computerized transcription errors may be present.      Rayna Sexton, PA-C 01/20/20 1956    Carmin Muskrat, MD 01/25/20 [phone]

## 2020-01-20 NOTE — Progress Notes (Signed)
   CC: right sided chest pain, hemoptysis  HPI:  [name] is a 39 y.o. female with PMH below.  Today we will address right sided chest pain, hemoptysis  Please see A&P for status of the patient's chronic medical conditions  Past Medical History:  Diagnosis Date  . Bronchitis   . Fibromyalgia   . Lupus ([facility])   . Pleuritis 12/31/2017   Review of Systems:  ROS: Pulmonary: pt endorses increased work of breathing, shortness of breath,  Cardiac: pt endorses chest pain,  Abdominal: pt endorses nausea and vomiting   Physical Exam:  Vitals:   01/20/20 0949  BP: (!) 155/83  Pulse: 95  Temp: 98 F (36.7 C)  TempSrc: Oral  SpO2: 100%  Weight: 187 lb 12.8 oz (85.2 kg)   Cardiac:tachycardic with normal rhythm, mild splitting of S2, no murmurs,   Pulmonary:difficult to get pt to take full inspirations due to pain MSK: no tenderness to palpation over right chest wall or abdominal muscles.  Muscles not tense or rigid Psych: Alert, conversant, in good spirits   Social History   Socioeconomic History  . Marital status: Married    Spouse name: Not on file  . Number of children: Not on file  . Years of education: Not on file  . Highest education level: Not on file  Occupational History  . Not on file  Tobacco Use  . Smoking status: Current Every Day Smoker    Packs/day: 0.25    Types: Cigarettes    Last attempt to quit: 09/20/2018    Years since quitting: 1.3  . Smokeless tobacco: Never Used  . Tobacco comment: 4-5 cigs/day.  Substance and Sexual Activity  . Alcohol use: Yes  . Drug use: No  . Sexual activity: Yes  Other Topics Concern  . Not on file  Social History Narrative  . Not on file   Social Determinants of Health   Financial Resource Strain:   . Difficulty of Paying Living Expenses:   Food Insecurity:   . Worried About Programme researcher, broadcasting/film/video in the Last Year:   . Barista in the Last Year:   Transportation Needs:   . Freight forwarder  (Medical):   Marland Kitchen Lack of Transportation (Non-Medical):   Physical Activity:   . Days of Exercise per Week:   . Minutes of Exercise per Session:   Stress:   . Feeling of Stress :   Social Connections:   . Frequency of Communication with Friends and Family:   . Frequency of Social Gatherings with Friends and Family:   . Attends Religious Services:   . Active Member of Clubs or Organizations:   . Attends Banker Meetings:   Marland Kitchen Marital Status:   Intimate Partner Violence:   . Fear of Current or Ex-Partner:   . Emotionally Abused:   Marland Kitchen Physically Abused:   . Sexually Abused:     Family History  Problem Relation Age of Onset  . Hypertension Mother   . Heart murmur Mother   . Healthy Father   . Thyroid disease Sister   . Breast cancer Paternal Grandmother   . Breast cancer Paternal Aunt   . Heart failure Sister        has ICD  . Cervical cancer Sister     Assessment & Plan:   See Encounters Tab for problem based charting.  Patient discussed with Dr. Cleda Daub

## 2020-01-20 NOTE — Assessment & Plan Note (Addendum)
Mainly right sided chest and body pain since Friday underneath her breast and in her back, her arm and legs.  Feels like she is having a lupus flare.  Raynaud's has reemerged on the feet.  What is new and worrisome is that she now feels short of breath with walking and 2 days ago began coughing up blood clots and chest pain when taking a deep breath.  Additionally she had some tingling in the left arm yesterday.  Muscle spasms in right leg.  Hasn't been in this much pain since her last hospital visit. Some nausea and vomiting x1 from the pain over the weekend.  Poor appetite.  Pain not reproducible on exam, muscles feel relaxed.  Severe pain when asking her to take a deep breath.  She has been afebrile.  Could be another pericarditis episode/lupus flare vs pulmonary pathology but given coughing up blood clots, dyspnea with exertion need to rule out more immediate concerns like PE.   -Sent pt to emergency department for further workup

## 2020-01-20 NOTE — Assessment & Plan Note (Addendum)
Pt describes mostly clots and some blood that she coughed up over this weekend in addition to her other symptoms.    -sent pt to ED for further evaluation

## 2020-02-05 ENCOUNTER — Other Ambulatory Visit: Payer: Self-pay

## 2020-02-05 NOTE — Telephone Encounter (Signed)
cyclobenzaprine (FLEXERIL) 10 MG tablet   Refill request @  [facility] [address] [place] - [address] [phone] (Phone) [phone] (Fax)

## 2020-02-08 ENCOUNTER — Other Ambulatory Visit: Payer: Self-pay | Admitting: Internal Medicine

## 2020-02-08 DIAGNOSIS — M328 Other forms of systemic lupus erythematosus: Secondary | ICD-10-CM

## 2020-02-22 ENCOUNTER — Other Ambulatory Visit: Payer: Self-pay | Admitting: Internal Medicine

## 2020-02-22 ENCOUNTER — Encounter: Payer: Self-pay | Admitting: Internal Medicine

## 2020-02-22 ENCOUNTER — Ambulatory Visit: Payer: Medicaid Other | Admitting: Internal Medicine

## 2020-02-22 VITALS — BP 149/82 | HR 87 | Temp 98.2°F | Ht 61.0 in | Wt 184.3 lb

## 2020-02-22 DIAGNOSIS — M797 Fibromyalgia: Secondary | ICD-10-CM

## 2020-02-22 DIAGNOSIS — M329 Systemic lupus erythematosus, unspecified: Secondary | ICD-10-CM | POA: Diagnosis not present

## 2020-02-22 DIAGNOSIS — M3219 Other organ or system involvement in systemic lupus erythematosus: Secondary | ICD-10-CM

## 2020-02-22 MED ORDER — CYCLOBENZAPRINE HCL 10 MG PO TABS: 10.0000 mg | ORAL_TABLET | Freq: Every day | ORAL | 1 refills | Status: DC

## 2020-02-22 NOTE — Patient Instructions (Addendum)
Thank you for allowing [place] to provide your care today. Today we discussed your pain everywhere    I have ordered bmp, tsh labs for you. I will call if any are abnormal.    Today we made no changes to your medications.    Please re-start your flexeril   Please return to clinic if your symptoms worsen.    Should you have any questions or concerns please call the internal medicine clinic at [phone].    Cyclobenzaprine tablets What is this medicine? CYCLOBENZAPRINE (sye kloe BEN za preen) is a muscle relaxer. It is used to treat muscle pain, spasms, and stiffness. This medicine may be used for other purposes; ask your health care provider or pharmacist if you have questions. COMMON BRAND NAME(S): Fexmid, Flexeril What should I tell my health care provider before I take this medicine? They need to know if you have any of these conditions:  heart disease, irregular heartbeat, or previous heart attack  liver disease  thyroid problem  an unusual or allergic reaction to cyclobenzaprine, tricyclic antidepressants, lactose, other medicines, foods, dyes, or preservatives  pregnant or trying to get pregnant  breast-feeding How should I use this medicine? Take this medicine by mouth with a glass of water. Follow the directions on the prescription label. If this medicine upsets your stomach, take it with food or milk. Take your medicine at regular intervals. Do not take it more often than directed. Talk to your pediatrician regarding the use of this medicine in children. Special care may be needed. Overdosage: If you think you have taken too much of this medicine contact a poison control center or emergency room at once. NOTE: This medicine is only for you. Do not share this medicine with others. What if I miss a dose? If you miss a dose, take it as soon as you can. If it is almost time for your next dose, take only that dose. Do not take double or extra doses. What may interact with this  medicine? Do not take this medicine with any of the following medications:  MAOIs like Carbex, Eldepryl, Marplan, Nardil, and Parnate  narcotic medicines for cough  safinamide This medicine may also interact with the following medications:  alcohol  bupropion  antihistamines for allergy, cough and cold  certain medicines for anxiety or sleep  certain medicines for bladder problems like oxybutynin, tolterodine  certain medicines for depression like amitriptyline, fluoxetine, sertraline  certain medicines for Parkinson's disease like benztropine, trihexyphenidyl  certain medicines for seizures like phenobarbital, primidone  certain medicines for stomach problems like dicyclomine, hyoscyamine  certain medicines for travel sickness like scopolamine  general anesthetics like halothane, isoflurane, methoxyflurane, propofol  ipratropium  local anesthetics like lidocaine, pramoxine, tetracaine  medicines that relax muscles for surgery  narcotic medicines for pain  phenothiazines like chlorpromazine, mesoridazine, prochlorperazine, thioridazine  verapamil This list may not describe all possible interactions. Give your health care provider a list of all the medicines, herbs, non-prescription drugs, or dietary supplements you use. Also tell them if you smoke, drink alcohol, or use illegal drugs. Some items may interact with your medicine. What should I watch for while using this medicine? Tell your doctor or health care professional if your symptoms do not start to get better or if they get worse. You may get drowsy or dizzy. Do not drive, use machinery, or do anything that needs mental alertness until you know how this medicine affects you. Do not stand or sit up quickly, especially if  you are an older patient. This reduces the risk of dizzy or fainting spells. Alcohol may interfere with the effect of this medicine. Avoid alcoholic drinks. If you are taking another medicine that  also causes drowsiness, you may have more side effects. Give your health care provider a list of all medicines you use. Your doctor will tell you how much medicine to take. Do not take more medicine than directed. Call emergency for help if you have problems breathing or unusual sleepiness. Your mouth may get dry. Chewing sugarless gum or sucking hard candy, and drinking plenty of water may help. Contact your doctor if the problem does not go away or is severe. What side effects may I notice from receiving this medicine? Side effects that you should report to your doctor or health care professional as soon as possible:  allergic reactions like skin rash, itching or hives, swelling of the face, lips, or tongue  breathing problems  chest pain  fast, irregular heartbeat  hallucinations  seizures  unusually weak or tired Side effects that usually do not require medical attention (report to your doctor or health care professional if they continue or are bothersome):  headache  nausea, vomiting This list may not describe all possible side effects. Call your doctor for medical advice about side effects. You may report side effects to FDA at 1-800-FDA-1088. Where should I keep my medicine? Keep out of the reach of children. Store at room temperature between 15 and 30 degrees C (59 and 86 degrees F). Keep container tightly closed. Throw away any unused medicine after the expiration date. NOTE: This sheet is a summary. It may not cover all possible information. If you have questions about this medicine, talk to your doctor, pharmacist, or health care provider.  2020 Elsevier/Gold Standard (2018-08-20 12:49:26)

## 2020-02-22 NOTE — Telephone Encounter (Signed)
Need refill on cyclobenzaprine (FLEXERIL) 10 MG tablet ;pt contact [phone]   [facility] [address], [place] - [address]

## 2020-02-22 NOTE — Progress Notes (Signed)
   CC: Chest pain  HPI: [name] is a 39 y.o. with PMH listed below presenting with complaint of chest pain. Please see problem based assessment and plan for further details.  Past Medical History:  Diagnosis Date  . Bronchitis   . Fibromyalgia   . Lupus (HCC)   . Pleuritis 12/31/2017   Review of Systems: Review of Systems  Constitutional: Negative for chills, fever and malaise/fatigue.  Eyes: Negative for blurred vision.  Respiratory: Negative for shortness of breath.   Cardiovascular: Negative for palpitations.  Gastrointestinal: Negative for constipation, diarrhea, nausea and vomiting.  Musculoskeletal: Positive for back pain, joint pain, myalgias and neck pain.  Neurological: Negative for dizziness and headaches.  All other systems reviewed and are negative.   Physical Exam: Vitals:   02/22/20 1451  BP: (!) 149/82  Pulse: 87  Temp: 98.2 F (36.8 C)  TempSrc: Oral  SpO2: 100%  Weight: 184 lb 4.8 oz (83.6 kg)  Height: 5\' 1"  (1.549 m)   Physical Exam  Constitutional: She is oriented to person, place, and time. She appears well-developed and well-nourished. No distress.  Cardiovascular: Normal rate, regular rhythm and intact distal pulses.  No murmur heard. Respiratory: Effort normal and breath sounds normal. She has no wheezes. She has no rales.  GI: Soft. Bowel sounds are normal. She exhibits no distension. There is no abdominal tenderness.  Musculoskeletal:        General: Tenderness (Diffuse tenderness of bilateral lower extremities, upper extremities and chest) present. Normal range of motion.  Neurological: She is alert and oriented to person, place, and time.  4/5 bilateral upper extremity weakness  Skin: Skin is warm and dry.    Assessment & Plan:   SLE (systemic lupus erythematosus) (HCC) Presents for f/u for SLE. Mentions chest pain and various areas of tenderness. Denies any rashes. Seen by Dr.Diab with rheumatology at [facility] and advised to f/u  with behavioral health for fibromyalgia. Currently on alternating dose of hydroxychloroquine and mentions adherence.  A/P Significant myalgias. Possibly due to Lupus flare but suspicion low as she is adherent to her plaquenil and she has alternative causes. - F/u w/ rheumatology - C/w Plaquenil  Fibromyalgia Diana Huynh is a 39 yo F w/ _PMH of fibromyalgia and SLE presenting to [facility] w/ complaints of myalgias. She states she was in her usual state of health with chronic pain until she ran out of her flexeril couple days ago. She mentions having significant pain of her bilateral proximal lower extremities as well as upper extremities. She states this is similar to her chronic pain but she is also having significant worsening severity with cramping of her muscles. She denies any fevers, chills, nausea, vomiting. Continues to be very distraught due to the pain. Continues to take ibuprofen and  Gabapentin with minimal relief.  A/P Presents w/ diffuse myalgia. Slight upper extremity weakness but could be secondary to pain. Previously treated with combination of NSAIDs, gabapentin, ibuprofen and flexeril. Will provide refills and c/w monitoring CK to r/o myositis as she is at risk due to her hx of SLE. - Flexeril refills sent - Check bmp, CK - C/w gabapentin, duloxetine, ibuprofen    Patient discussed with Dr. [facility]   -Rogelia Boga, PGY2 [facility] Internal Medicine Pager: [phone]

## 2020-02-23 ENCOUNTER — Encounter: Payer: Self-pay | Admitting: Internal Medicine

## 2020-02-23 LAB — BMP8+ANION GAP
Anion Gap: 16 mmol/L (ref 10.0–18.0)
BUN/Creatinine Ratio: 10 (ref 9–23)
BUN: 8 mg/dL (ref 6–20)
CO2: 22 mmol/L (ref 20–29)
Calcium: 9.4 mg/dL (ref 8.7–10.2)
Chloride: 101 mmol/L (ref 96–106)
Creatinine, Ser: 0.79 mg/dL (ref 0.57–1.00)
GFR calc Af Amer: 110 mL/min/{1.73_m2} (ref >59)
GFR calc non Af Amer: 95 mL/min/{1.73_m2} (ref >59)
Glucose: 93 mg/dL (ref 65–99)
Potassium: 4.3 mmol/L (ref 3.5–5.2)
Sodium: 139 mmol/L (ref 134–144)

## 2020-02-23 LAB — CK: Total CK: 96 U/L (ref 32–182)

## 2020-02-23 LAB — TSH: TSH: 2.6 u[IU]/mL (ref 0.450–4.500)

## 2020-02-23 NOTE — Assessment & Plan Note (Signed)
Diana Huynh is a 39 yo F w/ _PMH of fibromyalgia and SLE presenting to [facility] w/ complaints of myalgias. She states she was in her usual state of health with chronic pain until she ran out of her flexeril couple days ago. She mentions having significant pain of her bilateral proximal lower extremities as well as upper extremities. She states this is similar to her chronic pain but she is also having significant worsening severity with cramping of her muscles. She denies any fevers, chills, nausea, vomiting. Continues to be very distraught due to the pain. Continues to take ibuprofen and  Gabapentin with minimal relief.  A/P Presents w/ diffuse myalgia. Slight upper extremity weakness but could be secondary to pain. Previously treated with combination of NSAIDs, gabapentin, ibuprofen and flexeril. Will provide refills and c/w monitoring CK to r/o myositis as she is at risk due to her hx of SLE. - Flexeril refills sent - Check bmp, CK - C/w gabapentin, duloxetine, ibuprofen

## 2020-02-23 NOTE — Assessment & Plan Note (Signed)
Presents for f/u for SLE. Mentions chest pain and various areas of tenderness. Denies any rashes. Seen by Dr.Diab with rheumatology at [facility] and advised to f/u with behavioral health for fibromyalgia. Currently on alternating dose of hydroxychloroquine and mentions adherence.  A/P Significant myalgias. Possibly due to Lupus flare but suspicion low as she is adherent to her plaquenil and she has alternative causes. - F/u w/ rheumatology - C/w Plaquenil

## 2020-02-24 NOTE — Progress Notes (Signed)
Internal Medicine Clinic Attending ° °Case discussed with Dr. Lee at the time of the visit.  We reviewed the resident’s history and exam and pertinent patient test results.  I agree with the assessment, diagnosis, and plan of care documented in the resident’s note.  °

## 2020-02-24 NOTE — Addendum Note (Signed)
Addended by: Blanch Media A on: 02/24/2020 11:27 AM   Modules accepted: Level of Service

## 2020-03-08 ENCOUNTER — Telehealth: Payer: Self-pay | Admitting: Internal Medicine

## 2020-03-08 NOTE — Telephone Encounter (Signed)
Discussed normal lab results and reassured Diana Huynh. All other questions and concerns addressed.

## 2020-03-18 ENCOUNTER — Encounter: Payer: Self-pay | Admitting: *Deleted

## 2020-05-07 ENCOUNTER — Other Ambulatory Visit: Payer: Self-pay

## 2020-05-07 ENCOUNTER — Encounter: Payer: Self-pay | Admitting: Emergency Medicine

## 2020-05-07 ENCOUNTER — Ambulatory Visit
Admission: EM | Admit: 2020-05-07 | Discharge: 2020-05-07 | Disposition: A | Discharge status: Home / Self Care | Payer: Medicaid Other | Attending: Physician Assistant | Admitting: Physician Assistant

## 2020-05-07 DIAGNOSIS — Z1152 Encounter for screening for COVID-19: Secondary | ICD-10-CM

## 2020-05-07 DIAGNOSIS — Z20822 Contact with and (suspected) exposure to covid-19: Secondary | ICD-10-CM

## 2020-05-07 MED ORDER — BENZONATATE 200 MG PO CAPS: 200.0000 mg | ORAL_CAPSULE | Freq: Three times a day (TID) | ORAL | 0 refills | Status: DC

## 2020-05-07 MED ORDER — ALBUTEROL SULFATE HFA 108 (90 BASE) MCG/ACT IN AERS: 1.0000 | INHALATION_SPRAY | Freq: Four times a day (QID) | RESPIRATORY_TRACT | 0 refills | Status: AC | PRN

## 2020-05-07 MED ORDER — ONDANSETRON 4 MG PO TBDP: 4.0000 mg | ORAL_TABLET | Freq: Three times a day (TID) | ORAL | 0 refills | Status: DC | PRN

## 2020-05-07 NOTE — Discharge Instructions (Signed)
COVID PCR testing ordered. As discussed, given your symptoms, I would like you to quarantine as if you are positive for COVID regardless of symptoms.  You can discontinue quarantine after 10 days of symptom onset AND 24 hours of no fever with improvement of symptoms.   Tessalon for cough. Albuterol for cough, shortness of breath. Zofran for nausea/vomiting Tylenol/motrin for pain and fever. Keep hydrated, urine should be clear to pale yellow in color. If experiencing shortness of breath, trouble breathing, go to the emergency department for further evaluation needed.

## 2020-05-07 NOTE — ED Provider Notes (Signed)
[facility]    CSN: [id number] Arrival date & time: 05/07/20  1121      History   Chief Complaint Chief Complaint  Patient presents with  . URI  . Generalized Body Aches  . Emesis    HPI Diana Huynh is a 39 y.o. female.   39 year old female with history of lupus comes in for 5 day of URI symptoms. Sore throat, rhinorrhea, nasal congestion, cough, body aches. Tmax 99.6. has had nausea with multiple NBNB vomiting, also with diarrhea. Has had dyspnea on exertion, loss of taste/smell. Current every day smoker. No COVID vaccine.      Past Medical History:  Diagnosis Date  . Bronchitis   . Fibromyalgia   . Lupus (HCC)   . Pleuritis 12/31/2017    Patient Active Problem List   Diagnosis Date Noted  . Right-sided chest pain 01/20/2020  . Hemoptysis 01/20/2020  . GERD (gastroesophageal reflux disease) 02/12/2019  . Pulmonary nodule less than 6 cm determined by computed tomography of lung 02/12/2019  . Precordial chest pain 02/05/2019  . Cough 01/06/2019  . Healthcare maintenance 07/25/2018  . Fibromyalgia 06/25/2018  . Acute non-recurrent frontal sinusitis 02/10/2018  . Viral pericarditis 01/06/2018  . SLE (systemic lupus erythematosus) (HCC) 12/31/2017  . Pericardial effusion     Past Surgical History:  Procedure Laterality Date  . ANKLE SURGERY    . APPENDECTOMY    . CESAREAN SECTION      OB History   No obstetric history on file.      Home Medications    Prior to Admission medications   Medication Sig Start Date End Date Taking? Authorizing Provider  albuterol (VENTOLIN HFA) 108 (90 Base) MCG/ACT inhaler Inhale 1-2 puffs into the lungs every 6 (six) hours as needed for wheezing or shortness of breath. 05/07/20   Cathie Hoops, Riyanna Crutchley V, PA-C  alum & mag hydroxide-simeth (MAALOX/MYLANTA) 200-200-20 MG/5ML suspension Take 15 mLs by mouth every 6 (six) hours as needed for indigestion or heartburn.    [provider]  benzonatate (TESSALON) 200 MG capsule  Take 1 capsule (200 mg total) by mouth every 8 (eight) hours. 05/07/20   Cathie Hoops, Takelia Urieta V, PA-C  cyclobenzaprine (FLEXERIL) 10 MG tablet Take 1 tablet (10 mg total) by mouth at bedtime. 02/22/20   Theotis Barrio, MD  DULoxetine (CYMBALTA) 30 MG capsule Take 1 capsule (30 mg total) by mouth daily for 5 days, THEN 2 capsules (60 mg total) daily for 21 days. 02/09/19   Angelita Ingles, MD  DULoxetine (CYMBALTA) 60 MG capsule Take 60 mg by mouth daily. 10/01/19   [provider]  famotidine (PEPCID) 20 MG tablet Take 20 mg by mouth daily.     [provider]  gabapentin (NEURONTIN) 300 MG capsule Take 900 mg by mouth daily.    [provider]  hydroxychloroquine (PLAQUENIL) 200 MG tablet Take 200-400 mg by mouth daily. Take 200mg  every other day alternating with 400 mg    [provider]  ibuprofen (ADVIL) 600 MG tablet Take 1 tablet (600 mg total) by mouth every 6 (six) hours as needed. 06/25/19   Venter, Margaux, PA-C  ondansetron (ZOFRAN ODT) 4 MG disintegrating tablet Take 1 tablet (4 mg total) by mouth every 8 (eight) hours as needed for nausea or vomiting. 05/07/20   07/07/20, PA-C    Family History Family History  Problem Relation Age of Onset  . Hypertension Mother   . Heart murmur Mother   .  Healthy Father   . Thyroid disease Sister   . Breast cancer Paternal Grandmother   . Breast cancer Paternal Aunt   . Heart failure Sister        has ICD  . Cervical cancer Sister     Social History Social History   Tobacco Use  . Smoking status: Current Every Day Smoker    Packs/day: 0.25    Types: Cigarettes    Last attempt to quit: 09/20/2018    Years since quitting: 1.6  . Smokeless tobacco: Never Used  . Tobacco comment: 4-5 cigs/day.  Substance Use Topics  . Alcohol use: Yes  . Drug use: No     Allergies   Patient has no known allergies.   Review of Systems Review of Systems  Reason unable to perform ROS: See HPI as above.     Physical  Exam Triage Vital Signs ED Triage Vitals  Enc Vitals Group     BP 05/07/20 1139 (!) 163/80     Pulse Rate 05/07/20 1139 83     Resp 05/07/20 1139 18     Temp 05/07/20 1139 98.5 F (36.9 C)     Temp Source 05/07/20 1139 Oral     SpO2 05/07/20 1139 98 %     Weight --      Height --      Head Circumference --      Peak Flow --      Pain Score 05/07/20 1140 8     Pain Loc --      Pain Edu? --      Excl. in GC? --    No data found.  Updated Vital Signs BP (!) 163/80 (BP Location: Left Arm)   Pulse 83   Temp 98.5 F (36.9 C) (Oral)   Resp 18   SpO2 98%   Physical Exam Constitutional:      General: She is not in acute distress.    Appearance: Normal appearance. She is not ill-appearing, toxic-appearing or diaphoretic.  HENT:     Head: Normocephalic and atraumatic.     Mouth/Throat:     Mouth: Mucous membranes are moist.     Pharynx: Oropharynx is clear. Uvula midline.  Cardiovascular:     Rate and Rhythm: Normal rate and regular rhythm.     Heart sounds: Normal heart sounds. No murmur heard.  No friction rub. No gallop.   Pulmonary:     Effort: Pulmonary effort is normal. No accessory muscle usage, prolonged expiration, respiratory distress or retractions.     Comments: Lungs clear to auscultation without adventitious lung sounds. Abdominal:     General: Bowel sounds are normal.     Palpations: Abdomen is soft.     Tenderness: There is no abdominal tenderness. There is no guarding or rebound.  Musculoskeletal:     Cervical back: Normal range of motion and neck supple.  Neurological:     General: No focal deficit present.     Mental Status: She is alert and oriented to person, place, and time.    UC Treatments / Results  Labs (all labs ordered are listed, but only abnormal results are displayed) Labs Reviewed  NOVEL CORONAVIRUS, NAA    EKG   Radiology No results found.  Procedures Procedures (including critical care time)  Medications Ordered in  UC Medications - No data to display  Initial Impression / Assessment and Plan / UC Course  I have reviewed the triage vital signs and the nursing notes.  Pertinent labs & imaging results that were available during my care of the patient were reviewed by me and considered in my medical decision making (see chart for details).    COVID PCR test ordered. However, given patient's symptoms, suspect COVID and will have patient follow quarantine instructions for COVID. No alarming signs on exam.  Patient speaking in full sentences without respiratory distress.  Symptomatic treatment discussed.  Push fluids.  Return precautions given.  Patient expresses understanding and agrees to plan.  Final Clinical Impressions(s) / UC Diagnoses   Final diagnoses:  Encounter for screening for COVID-19  Suspected COVID-19 virus infection    ED Prescriptions    Medication Sig Dispense Auth. Provider   benzonatate (TESSALON) 200 MG capsule Take 1 capsule (200 mg total) by mouth every 8 (eight) hours. 21 capsule Shakoya Gilmore V, PA-C   albuterol (VENTOLIN HFA) 108 (90 Base) MCG/ACT inhaler Inhale 1-2 puffs into the lungs every 6 (six) hours as needed for wheezing or shortness of breath. 8 g Kourtnei Rauber V, PA-C   ondansetron (ZOFRAN ODT) 4 MG disintegrating tablet Take 1 tablet (4 mg total) by mouth every 8 (eight) hours as needed for nausea or vomiting. 15 tablet Belinda Fisher, PA-C     PDMP not reviewed this encounter.   Belinda Fisher, PA-C 05/07/20 1221

## 2020-05-07 NOTE — ED Triage Notes (Signed)
Pt here for body aches, URI and vomiting and diarrhea x 3 days; pt sts sore throat x 5 days

## 2020-05-08 LAB — SARS-COV-2, NAA 2 DAY TAT

## 2020-05-08 LAB — NOVEL CORONAVIRUS, NAA: SARS-CoV-2, NAA: NOT DETECTED

## 2020-05-11 ENCOUNTER — Other Ambulatory Visit: Payer: Self-pay

## 2020-05-11 ENCOUNTER — Ambulatory Visit: Payer: Medicaid Other | Admitting: Internal Medicine

## 2020-05-11 ENCOUNTER — Telehealth: Payer: Self-pay | Admitting: *Deleted

## 2020-05-11 DIAGNOSIS — J988 Other specified respiratory disorders: Secondary | ICD-10-CM

## 2020-05-11 NOTE — Telephone Encounter (Signed)
Pt called and states she has been sick for appr 1 week. Had NEGATIVE COVID 2 days ago. She is afraid she is having a fibromyalgia or lupus flare also States highest fever has been 99.8. she had N&V and diarrhea, that has resolved. She is congested, coughing sneezing, aching all over. She was advised to go back to urg care but states no she wants an internal medicine doctor because her COVID came back negative

## 2020-05-11 NOTE — Progress Notes (Signed)
  [facility] Internal Medicine Residency Telephone Encounter Continuity Care Appointment  HPI:   This telephone encounter was created for [name] on 05/11/2020 for the following purpose/cc sore throat and congestion. Last Monday a week ago she developed sore throat. Now she has sore throat and congestion. She feels she continues to feel worst. Her temp was 99.6 over the weekend. At urgent care this Saturday she received cough Supressant and inhaler.  Tested negative for Covid. On Plaquenil for he lupus and has not been on prednisone in over a year. Her son and granddaughter both had some congestion last week but are feeling better. She has contacted her Rheumatologist and waiting on a return phone call.     Past Medical History:  Past Medical History:  Diagnosis Date  . Bronchitis   . Fibromyalgia   . Lupus (HCC)   . Pleuritis 12/31/2017      ROS:  Review of Systems  Constitutional: Positive for malaise/fatigue. Negative for weight loss.  HENT: Positive for congestion, sinus pain and sore throat.   Eyes: Negative for pain and discharge.  Respiratory: Positive for cough and sputum production. Negative for wheezing.   Musculoskeletal: Positive for myalgias and neck pain.     Assessment / Plan / Recommendations:   Please see A&P under problem oriented charting for assessment of the patient's acute and chronic medical conditions.   As always, pt is advised that if symptoms worsen or new symptoms arise, they should go to an urgent care facility or to to ER for further evaluation.   Consent and Medical Decision Making:   Patient discussed with Dr. Sandre Kitty  This is a telephone encounter between [facility] and Milus Banister on 05/11/2020 for respiratory infection.We will have patient come in for an in-person visit given she will need further testing.  The visit was conducted with the patient located at home and Milus Banister at [facility]. The patient's identity was confirmed using their  DOB and current address. The patient has consented to being evaluated through a telephone encounter and understands the associated risks (an examination cannot be done and the patient may need to come in for an appointment) / benefits (allows the patient to remain at home, decreasing exposure to coronavirus). I personally spent not applicable minutes on medical discussion.

## 2020-05-12 ENCOUNTER — Ambulatory Visit: Payer: Medicaid Other | Admitting: Internal Medicine

## 2020-05-12 ENCOUNTER — Ambulatory Visit (HOSPITAL_COMMUNITY): Payer: Medicaid Other | Attending: Internal Medicine

## 2020-05-12 ENCOUNTER — Other Ambulatory Visit: Payer: Self-pay

## 2020-05-12 VITALS — BP 142/76 | HR 102 | Temp 98.5°F | Ht 61.0 in | Wt 193.7 lb

## 2020-05-12 DIAGNOSIS — M3219 Other organ or system involvement in systemic lupus erythematosus: Secondary | ICD-10-CM | POA: Diagnosis not present

## 2020-05-12 DIAGNOSIS — J988 Other specified respiratory disorders: Secondary | ICD-10-CM | POA: Insufficient documentation

## 2020-05-12 LAB — RESP PANEL BY RT PCR (RSV, FLU A&B, COVID)
Influenza A by PCR: NEGATIVE
Influenza B by PCR: NEGATIVE
Respiratory Syncytial Virus by PCR: NEGATIVE
SARS Coronavirus 2 by RT PCR: NEGATIVE

## 2020-05-12 NOTE — Patient Instructions (Signed)
Thank you for trusting me with your care. To recap, today we discussed the following:   1. Systemic lupus erythematosus with other organ involvement, unspecified SLE type ([place]) - CBC with Diff - BMP8+Anion Gap - EKG 12-Lead - Urinalysis, Reflex Microscopic - Protein / creatinine ratio, urine - Sed Rate (ESR) - CRP (C-Reactive Protein) - Anti-DNA antibody, Native double-stranded - C3 and C4  2. Respiratory infection - Respiratory virus panel - DG Chest 2 View; Future

## 2020-05-13 ENCOUNTER — Encounter: Payer: Self-pay | Admitting: Internal Medicine

## 2020-05-13 ENCOUNTER — Ambulatory Visit (HOSPITAL_COMMUNITY)
Admission: RE | Admit: 2020-05-13 | Discharge: 2020-05-13 | Disposition: A | Discharge status: Home / Self Care | Payer: Medicaid Other | Source: Ambulatory Visit | Attending: Internal Medicine | Admitting: Internal Medicine

## 2020-05-13 ENCOUNTER — Telehealth: Payer: Self-pay

## 2020-05-13 DIAGNOSIS — J988 Other specified respiratory disorders: Secondary | ICD-10-CM | POA: Insufficient documentation

## 2020-05-13 LAB — BMP8+ANION GAP
Anion Gap: 12 mmol/L (ref 10.0–18.0)
BUN/Creatinine Ratio: 10 (ref 9–23)
BUN: 9 mg/dL (ref 6–20)
CO2: 24 mmol/L (ref 20–29)
Calcium: 9.1 mg/dL (ref 8.7–10.2)
Chloride: 101 mmol/L (ref 96–106)
Creatinine, Ser: 0.88 mg/dL (ref 0.57–1.00)
GFR calc Af Amer: 96 mL/min/{1.73_m2} (ref >59)
GFR calc non Af Amer: 84 mL/min/{1.73_m2} (ref >59)
Glucose: 70 mg/dL (ref 65–99)
Potassium: 4.1 mmol/L (ref 3.5–5.2)
Sodium: 137 mmol/L (ref 134–144)

## 2020-05-13 LAB — CBC WITH DIFFERENTIAL/PLATELET
Basophils Absolute: 0.1 10*3/uL (ref 0.0–0.2)
Basos: 1 %
EOS (ABSOLUTE): 0.1 10*3/uL (ref 0.0–0.4)
Eos: 2 %
Hematocrit: 36.2 % (ref 34.0–46.6)
Hemoglobin: 11.4 g/dL (ref 11.1–15.9)
Immature Grans (Abs): 0 10*3/uL (ref 0.0–0.1)
Immature Granulocytes: 0 %
Lymphocytes Absolute: 1.9 10*3/uL (ref 0.7–3.1)
Lymphs: 29 %
MCH: 28.4 pg (ref 26.6–33.0)
MCHC: 31.5 g/dL (ref 31.5–35.7)
MCV: 90 fL (ref 79–97)
Monocytes Absolute: 0.6 10*3/uL (ref 0.1–0.9)
Monocytes: 9 %
Neutrophils Absolute: 4 10*3/uL (ref 1.4–7.0)
Neutrophils: 59 %
Platelets: 251 10*3/uL (ref 150–450)
RBC: 4.01 x10E6/uL (ref 3.77–5.28)
RDW: 14.4 % (ref 11.7–15.4)
WBC: 6.7 10*3/uL (ref 3.4–10.8)

## 2020-05-13 LAB — C3 AND C4
Complement C3, Serum: 129 mg/dL (ref 82–167)
Complement C4, Serum: 26 mg/dL (ref 12–38)

## 2020-05-13 LAB — URINALYSIS, ROUTINE W REFLEX MICROSCOPIC
Bilirubin, UA: NEGATIVE
Glucose, UA: NEGATIVE
Ketones, UA: NEGATIVE
Leukocytes,UA: NEGATIVE
Nitrite, UA: NEGATIVE
Protein,UA: NEGATIVE
RBC, UA: NEGATIVE
Specific Gravity, UA: 1.017 (ref 1.005–1.030)
Urobilinogen, Ur: 0.2 mg/dL (ref 0.2–1.0)
pH, UA: 5.5 (ref 5.0–7.5)

## 2020-05-13 LAB — PROTEIN / CREATININE RATIO, URINE
Creatinine, Urine: 113 mg/dL
Protein, Ur: 8.3 mg/dL
Protein/Creat Ratio: 73 mg/g creat (ref 0–200)

## 2020-05-13 LAB — C-REACTIVE PROTEIN: CRP: 2 mg/L (ref 0–10)

## 2020-05-13 LAB — ANTI-DNA ANTIBODY, DOUBLE-STRANDED: dsDNA Ab: 51 IU/mL — ABNORMAL HIGH (ref 0–9)

## 2020-05-13 LAB — SEDIMENTATION RATE: Sed Rate: 38 mm/hr — ABNORMAL HIGH (ref 0–32)

## 2020-05-13 MED ORDER — PREDNISONE 5 MG PO TABS: ORAL_TABLET | ORAL | 0 refills | Status: AC

## 2020-05-13 NOTE — Telephone Encounter (Signed)
Pt called, states she is feeling worse.  C/O diaphoresis, increased body aches, increased SOB, headache, chills,and now having some chest pain and pressure.  She states her body also feels hot to touch, T-99.3 at 1300, but also taking ibuprofen. RN instructed patient to go to the ER since she was having new onset of chest pain and pressure with worsening symptoms.  Pt refused and states she wants to avoid ER and speak with Dr. Barbaraann Faster about lab/chest Xray result. Will forward to Dr. Barbaraann Faster. SChaplin, RN,BSN

## 2020-05-13 NOTE — Addendum Note (Signed)
Addended by: Gardenia Phlegm on: 05/13/2020 06:01 PM   Modules accepted: Orders

## 2020-05-13 NOTE — Assessment & Plan Note (Signed)
Patient had telehealth visit on 8/11 after evaluation asked to come in today.  She has had approximately 1 week of respiratory illness.  She describes mostly upper airway congestion and cough.  Her cough has been productive at times , but mostly dry.  Her T-max has been 99.6.  Her granddaughter and son both had congestion last week.  Patient has a diagnosis of lupus on Plaquenil.  She was tested for Covid approximately 5 days ago which was negative.  Given ongoing respiratory symptoms we will check a respiratory viral panel today.  Also considering that this may be a flare of her lupus. Plan: Respiratory viral panel Chest x-ray CBC with differential

## 2020-05-13 NOTE — Assessment & Plan Note (Addendum)
Patient was presenting for symptoms of upper respiratory infection going on for about a week.  She is also had pain on her shoulders, occasional mild chest pain, fatigue, and muscle pain.  She describes her face as swollen.  She left a message for her rheumatologist, but has not been able to get in touch with him yet.  Patient has noticed a rash on her face and shoulders, not appreciable on my exam.    Assessment: Given symptoms will will evaluate for Lupus Flare. Will hold off on starting treatment until labs return and continue Plaquenil as prescribed.   Plan:  - CBC with Diff - BMP8+Anion Gap - EKG 12-Lead - Urinalysis, Reflex Microscopic - Protein / creatinine ratio, urine - Sed Rate (ESR) - CRP (C-Reactive Protein) - Anti-DNA antibody, Native double-stranded - C3 and C4 - EKG 12-Lead for evaluation of pericarditis.   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 The anti DS DNA level at diagnosis was around 80 and when she was admitted for pericardial effusion last year the level was 57.  Patient is followed at [facility] and I called to discuss case with fellow on call. Given patients C3 and C4 normal rheumatologist couldn't be certain this was a flare. Called and discussed risk and benefits of starting a prednisone taper with patient. Patient says this feels like her flare in the past. Patient had normal chest xray and RVP. We will start prednisone with a taper and patient given instructions to monitor symptoms. Discussed this with attending in clinic.  Prednisone Taper 20 mg x 3 day, 15 mg x 3 days, 10 mg x 3 days, 5 mg x 3 days

## 2020-05-13 NOTE — Telephone Encounter (Signed)
Dr. Barbaraann Faster not in office.  Dr. Sandre Kitty reviewed labs, chest Xray, and triage note below.  Dr. Sandre Kitty agreed patient should be evaluated in ER for new onset of chest pain and pressure with worsening respiratory symptoms. Patient called and notified of above, also told her labs looked okay, lupus labwork still pending, chest xray was normal and reiterated she should present to ER per attending.  She verbalized understanding. SChaplin, RN,BSN

## 2020-05-13 NOTE — Progress Notes (Signed)
   CC: cough and congestion  HPI:[name] is a 39 y.o. female who presents for evaluation of cough and congestion. Please see individual problem based A/P for details.   Past Medical History:  Diagnosis Date  . Bronchitis   . Fibromyalgia   . Lupus (HCC)   . Pleuritis 12/31/2017   Review of Systems:  ROS negative except as mentioned in individual problem based A/P.  Physical Exam: Vitals:   05/12/20 1548  BP: (!) 142/76  Pulse: (!) 102  Temp: 98.5 F (36.9 C)  TempSrc: Oral  SpO2: 100%  Weight: 193 lb 11.2 oz (87.9 kg)  Height: 5\' 1"  (1.549 m)      General: NAD, appears ill HE: Normocephalic, atraumatic , EOMI, Conjunctivae normal ENT: No congestion, no rhinorrhea, no exudate or erythema  Cardiovascular: tachycardic, regular rhythm.  No murmurs, rubs, or gallops Pulmonary : Decreased breath sound bilateral bases, initial wheeze, clears. No rhonchi Abdominal: soft, nontender,  bowel sounds present Musculoskeletal: no swelling , deformity Skin: cool hands, noerythema, or rash Psychiatric/Behavioral:  normal mood, normal behavior    Assessment & Plan:   See Encounters Tab for problem based charting.  Patient discussed with Dr. 

## 2020-05-15 NOTE — Telephone Encounter (Signed)
See my addendum in chart. Patient was called and chest pain eased off.  Started on prednisone with taper.

## 2020-05-20 NOTE — Progress Notes (Signed)
Internal Medicine Clinic Attending  Case discussed with Dr. Steen  At the time of the visit.  We reviewed the resident's history and exam and pertinent patient test results.  I agree with the assessment, diagnosis, and plan of care documented in the resident's note.  

## 2020-06-10 ENCOUNTER — Other Ambulatory Visit: Payer: Self-pay

## 2020-06-10 ENCOUNTER — Emergency Department (HOSPITAL_COMMUNITY)
Admission: EM | Admit: 2020-06-10 | Discharge: 2020-06-11 | Disposition: A | Discharge status: Home / Self Care | Payer: Medicaid Other | Attending: Emergency Medicine | Admitting: Emergency Medicine

## 2020-06-10 ENCOUNTER — Encounter (HOSPITAL_COMMUNITY): Payer: Self-pay

## 2020-06-10 DIAGNOSIS — S50311A Abrasion of right elbow, initial encounter: Secondary | ICD-10-CM | POA: Insufficient documentation

## 2020-06-10 DIAGNOSIS — S6991XA Unspecified injury of right wrist, hand and finger(s), initial encounter: Secondary | ICD-10-CM | POA: Diagnosis present

## 2020-06-10 DIAGNOSIS — F17210 Nicotine dependence, cigarettes, uncomplicated: Secondary | ICD-10-CM | POA: Diagnosis not present

## 2020-06-10 DIAGNOSIS — Y999 Unspecified external cause status: Secondary | ICD-10-CM | POA: Insufficient documentation

## 2020-06-10 DIAGNOSIS — M7918 Myalgia, other site: Secondary | ICD-10-CM

## 2020-06-10 DIAGNOSIS — Z79899 Other long term (current) drug therapy: Secondary | ICD-10-CM | POA: Diagnosis not present

## 2020-06-10 DIAGNOSIS — S025XXA Fracture of tooth (traumatic), initial encounter for closed fracture: Secondary | ICD-10-CM | POA: Diagnosis not present

## 2020-06-10 DIAGNOSIS — S50312A Abrasion of left elbow, initial encounter: Secondary | ICD-10-CM | POA: Insufficient documentation

## 2020-06-10 DIAGNOSIS — Y9239 Other specified sports and athletic area as the place of occurrence of the external cause: Secondary | ICD-10-CM | POA: Diagnosis not present

## 2020-06-10 DIAGNOSIS — W010XXA Fall on same level from slipping, tripping and stumbling without subsequent striking against object, initial encounter: Secondary | ICD-10-CM | POA: Diagnosis not present

## 2020-06-10 DIAGNOSIS — S00511A Abrasion of lip, initial encounter: Secondary | ICD-10-CM | POA: Insufficient documentation

## 2020-06-10 DIAGNOSIS — S62646A Nondisplaced fracture of proximal phalanx of right little finger, initial encounter for closed fracture: Secondary | ICD-10-CM

## 2020-06-10 DIAGNOSIS — S80212A Abrasion, left knee, initial encounter: Secondary | ICD-10-CM | POA: Insufficient documentation

## 2020-06-10 DIAGNOSIS — S80211A Abrasion, right knee, initial encounter: Secondary | ICD-10-CM | POA: Insufficient documentation

## 2020-06-10 DIAGNOSIS — W19XXXA Unspecified fall, initial encounter: Secondary | ICD-10-CM

## 2020-06-10 DIAGNOSIS — Y9302 Activity, running: Secondary | ICD-10-CM | POA: Insufficient documentation

## 2020-06-10 NOTE — ED Triage Notes (Addendum)
Pt arrives to ED w/ c/o mechanical fall today. Pt c/o 9/10 generalized pain. Pt AOx4, denies loc, no blood thinners. Pt also noted to have chipped tooth and abrasions to hands, knee, and face. Bleeding controlled. Pt tearful in triage.

## 2020-06-11 ENCOUNTER — Emergency Department (HOSPITAL_COMMUNITY): Payer: Medicaid Other

## 2020-06-11 MED ORDER — IBUPROFEN 400 MG PO TABS
600.0000 mg | ORAL_TABLET | Freq: Once | ORAL | Status: AC
  Administered 2020-06-11: 600 mg via ORAL
  Filled 2020-06-11: qty 1

## 2020-06-11 MED ORDER — CYCLOBENZAPRINE HCL 10 MG PO TABS
10.0000 mg | ORAL_TABLET | Freq: Once | ORAL | Status: AC
  Administered 2020-06-11: 10 mg via ORAL
  Filled 2020-06-11: qty 1

## 2020-06-11 NOTE — ED Notes (Signed)
Returned from xray

## 2020-06-11 NOTE — ED Notes (Signed)
Patient verbalizes understanding of discharge instructions. Opportunity for questioning and answers were provided. Armband removed by staff, pt discharged from ED. Pt. ambulatory and discharged home.  

## 2020-06-11 NOTE — Discharge Instructions (Signed)
Your xrays did show a fracture of your pinky finger at the base near the knuckle. Please keep splint on and follow up with Dr. Magnus Ivan orthopedist for further evaluation. While at home please rest, ice, and elevate your hand to reduce pain/swelling. Continue taking your regular medications for pain.   Follow up with your dentist regarding your chipped tooth  Return to the ED for any worsening symptoms

## 2020-06-11 NOTE — ED Notes (Signed)
Pt to xray

## 2020-06-11 NOTE — ED Provider Notes (Signed)
[facility] EMERGENCY DEPARTMENT Provider Note   CSN: [id number] Arrival date & time: 06/10/20  2036     History Chief Complaint  Patient presents with  . Fall    Diana Huynh is a 39 y.o. female with PMHx lupus, fibromyalgia who presents to the ED today with complaint of diffuse pain s/p mechanical fall that occurred last night. Pt reports that she was doing exercises with her daughter on her street yesterday; she was running when she tripped and fell. She landed directly onto her bilateral knees and hands as well as hit her face on the concrete. No LOC. Pt is not anticoagulated. She reports she began having immediate pain everywhere that has worsened since. She came to the ED for further evaluation; has not taken anything for pain since arriving. She typically takes Ibuprofen, Flexeril, and Gabapentin for pain however was just switched from Gabapentin to Lyrica yesterday. Pt reports she chipped a tooth in the process as well; her teeth feel aligned. She has multiple abrasions to her hands, knees, and face. She is unsure regarding her tetanus status. Denies vision changes, nausea, vomiting, confusion, speech changes, chest pain, abdominal pain, or any other associated symptoms.   Per chart review tDap UTD 07/25/2018  The history is provided by the patient and medical records.       Past Medical History:  Diagnosis Date  . Bronchitis   . Fibromyalgia   . Lupus (HCC)   . Pleuritis 12/31/2017    Patient Active Problem List   Diagnosis Date Noted  . Respiratory infection 05/12/2020  . Right-sided chest pain 01/20/2020  . Hemoptysis 01/20/2020  . GERD (gastroesophageal reflux disease) 02/12/2019  . Pulmonary nodule less than 6 cm determined by computed tomography of lung 02/12/2019  . Precordial chest pain 02/05/2019  . Cough 01/06/2019  . Healthcare maintenance 07/25/2018  . Fibromyalgia 06/25/2018  . Acute non-recurrent frontal sinusitis 02/10/2018  . Viral  pericarditis 01/06/2018  . SLE (systemic lupus erythematosus) (HCC) 12/31/2017  . Pericardial effusion     Past Surgical History:  Procedure Laterality Date  . ANKLE SURGERY    . APPENDECTOMY    . CESAREAN SECTION       OB History   No obstetric history on file.     Family History  Problem Relation Age of Onset  . Hypertension Mother   . Heart murmur Mother   . Healthy Father   . Thyroid disease Sister   . Breast cancer Paternal Grandmother   . Breast cancer Paternal Aunt   . Heart failure Sister        has ICD  . Cervical cancer Sister     Social History   Tobacco Use  . Smoking status: Current Every Day Smoker    Packs/day: 0.25    Types: Cigarettes    Last attempt to quit: 09/20/2018    Years since quitting: 1.7  . Smokeless tobacco: Never Used  . Tobacco comment: 4-5 cigs/day.  Substance Use Topics  . Alcohol use: Yes  . Drug use: No    Home Medications Prior to Admission medications   Medication Sig Start Date End Date Taking? Authorizing Provider  albuterol (VENTOLIN HFA) 108 (90 Base) MCG/ACT inhaler Inhale 1-2 puffs into the lungs every 6 (six) hours as needed for wheezing or shortness of breath. 05/07/20   Cathie Hoops, Amy V, PA-C  alum & mag hydroxide-simeth (MAALOX/MYLANTA) 200-200-20 MG/5ML suspension Take 15 mLs by mouth every 6 (six) hours as needed for indigestion  or heartburn.    [provider]  benzonatate (TESSALON) 200 MG capsule Take 1 capsule (200 mg total) by mouth every 8 (eight) hours. 05/07/20   Cathie HoopsYu, Amy V, PA-C  cyclobenzaprine (FLEXERIL) 10 MG tablet Take 1 tablet (10 mg total) by mouth at bedtime. 02/22/20   Theotis BarrioLee, Joshua K, MD  DULoxetine (CYMBALTA) 30 MG capsule Take 1 capsule (30 mg total) by mouth daily for 5 days, THEN 2 capsules (60 mg total) daily for 21 days. 02/09/19   Angelita InglesWinfrey, William B, MD  DULoxetine (CYMBALTA) 60 MG capsule Take 60 mg by mouth daily. 10/01/19   [provider]  famotidine (PEPCID) 20 MG tablet Take 20 mg  by mouth daily.     [provider]  gabapentin (NEURONTIN) 300 MG capsule Take 900 mg by mouth daily.    [provider]  hydroxychloroquine (PLAQUENIL) 200 MG tablet Take 200-400 mg by mouth daily. Take 200mg  every other day alternating with 400 mg    [provider]  ibuprofen (ADVIL) 600 MG tablet Take 1 tablet (600 mg total) by mouth every 6 (six) hours as needed. 06/25/19   Shigeru Lampert, PA-C  ondansetron (ZOFRAN ODT) 4 MG disintegrating tablet Take 1 tablet (4 mg total) by mouth every 8 (eight) hours as needed for nausea or vomiting. 05/07/20   Belinda FisherYu, Amy V, PA-C    Allergies    Patient has no known allergies.  Review of Systems   Review of Systems  Constitutional: Negative for chills and fever.  Eyes: Negative for visual disturbance.  Cardiovascular: Negative for chest pain.  Gastrointestinal: Negative for abdominal pain, nausea and vomiting.  Musculoskeletal: Positive for arthralgias.  Skin: Positive for wound (multiple abrasions).  Neurological: Negative for syncope, weakness and numbness.    Physical Exam Updated Vital Signs BP (!) 152/97 (BP Location: Left Arm)   Pulse 92   Temp 98.9 F (37.2 C) (Oral)   Resp 18   SpO2 99%   Physical Exam Vitals and nursing note reviewed.  Constitutional:      Appearance: She is obese. She is not ill-appearing or diaphoretic.  HENT:     Head: Normocephalic.     Comments: Mild swelling noted to upper lip; abrasions to philtrum with TTP, small chip noted to corner of tooth #8; no malocclusion appreciated    Right Ear: Tympanic membrane normal.     Left Ear: Tympanic membrane normal.  Eyes:     Extraocular Movements: Extraocular movements intact.     Conjunctiva/sclera: Conjunctivae normal.     Pupils: Pupils are equal, round, and reactive to light.  Neck:     Comments: No C midline spinal TTP Cardiovascular:     Rate and Rhythm: Normal rate and regular rhythm.     Pulses: Normal pulses.  Pulmonary:      Effort: Pulmonary effort is normal.     Breath sounds: Normal breath sounds. No wheezing, rhonchi or rales.  Abdominal:     Tenderness: There is no abdominal tenderness. There is no guarding or rebound.  Musculoskeletal:     Comments: No T or L midline spinal TTP  Pelvis stable  + swelling and abrasions noted to both the dorsal and plantar aspect of bilateral hands with diffuse TTP; abrasions appreciated to right elbow as well with associated TTP; radial pulses intact; cap refill <  2 seconds; no tenderness to remainder of BUEs including wrists, bilateral forearms, left elbow, bilateral upper arms or shoulders; ROM intact; strength equal bilaterally   +  abrasions noted to bilateral knees with TTP; ROM intact however limited s/2 pain; strength and sensation intact; negative anterior and posterior drawer test bilaterally; no varus or valgus laxity appreciated; no tenderness to remainder of BLEs; strength and sensation intact; intact distal pulses    Skin:    General: Skin is warm and dry.     Coloration: Skin is not jaundiced.  Neurological:     Mental Status: She is alert.     Comments: CN 3-12 grossly intact A&O x4 GCS 15 Sensation and strength intact Gait nonataxic including with tandem walking Coordination with finger-to-nose WNL Neg romberg, neg pronator drift     ED Results / Procedures / Treatments   Labs (all labs ordered are listed, but only abnormal results are displayed) Labs Reviewed - No data to display  EKG None  Radiology DG Elbow Complete Right  Result Date: 06/11/2020 CLINICAL DATA:  Pain following fall EXAM: RIGHT ELBOW - COMPLETE 3+ VIEW COMPARISON:  None. FINDINGS: Frontal, lateral, and bilateral oblique views were obtained. There is no fracture or dislocation. No joint effusion. No appreciable joint space narrowing or erosion. IMPRESSION: No fracture or dislocation.  No evident arthropathy. Electronically Signed   By: Bretta Bang III M.D.   On:  06/11/2020 10:13   DG Knee Complete 4 Views Left  Result Date: 06/11/2020 CLINICAL DATA:  Pain following fall EXAM: LEFT KNEE - COMPLETE 4+ VIEW COMPARISON:  None. FINDINGS: Frontal, lateral, and bilateral oblique views were obtained. No fracture or dislocation. No joint effusion. There is minimal joint space narrowing medially. Other joint spaces appear normal. No erosive change. IMPRESSION: Slight joint space narrowing medially. No fracture, dislocation, or joint effusion. Electronically Signed   By: Bretta Bang III M.D.   On: 06/11/2020 10:11   DG Knee Complete 4 Views Right  Result Date: 06/11/2020 CLINICAL DATA:  Pain following fall EXAM: RIGHT KNEE - COMPLETE 4+ VIEW COMPARISON:  None. FINDINGS: Frontal, lateral, and bilateral oblique views were obtained. No fracture or dislocation. No joint effusion. There is slight narrowing medially. Other joint spaces appear normal. No erosive change. IMPRESSION: Slight joint space narrowing medially. No fracture, dislocation, or joint effusion. Electronically Signed   By: Bretta Bang III M.D.   On: 06/11/2020 10:12   DG Hand Complete Left  Result Date: 06/11/2020 CLINICAL DATA:  Pain following fall EXAM: LEFT HAND - COMPLETE 3+ VIEW COMPARISON:  None. FINDINGS: Frontal, oblique, and lateral views were obtained. No fracture or dislocation. Joint spaces appear normal. No erosive change. IMPRESSION: No fracture or dislocation.  No evident arthropathy. Electronically Signed   By: Bretta Bang III M.D.   On: 06/11/2020 10:10   DG Hand Complete Right  Result Date: 06/11/2020 CLINICAL DATA:  Pain following fall EXAM: RIGHT HAND - COMPLETE 3+ VIEW COMPARISON:  None. FINDINGS: Frontal, oblique, and lateral views were obtained. There is a fracture of the proximal metacarpal of the fifth proximal phalanx with mild impaction in this area. No other fracture appreciable. No dislocation. Joint spaces appear normal. No erosive change. IMPRESSION: Fracture  proximal aspect fifth proximal phalanx with impaction at the fracture site. No other fracture. No dislocation. No appreciable arthropathic change. Electronically Signed   By: Bretta Bang III M.D.   On: 06/11/2020 10:09    Procedures Procedures (including critical care time)  Medications Ordered in ED Medications  ibuprofen (ADVIL) tablet 600 mg (600 mg Oral Given 06/11/20 0904)  cyclobenzaprine (FLEXERIL) tablet 10 mg (10 mg Oral Given 06/11/20 0904)  ED Course  I have reviewed the triage vital signs and the nursing notes.  Pertinent labs & imaging results that were available during my care of the patient were reviewed by me and considered in my medical decision making (see chart for details).    MDM Rules/Calculators/A&P                          39 year old female with a history of lupus and fibromyalgia who presents to the ED today after sustaining mechanical fall last night while exercising with her daughter in her neighborhood on the street. [place] and landed directly on her bilateral knees and bilateral hands, patient's face also hit the concrete, no loss of consciousness. Patient is not anticoagulated. She is not sure of her tetanus status however per chart review it is up-to-date in 2019. She is complaining of pain all over however mostly in her knees and hands and face. She has some chip associated with tooth #8. No malocclusion. No signs of lip laceration from teeth for closure. We'll plan to obtain x-rays of her knees, hands, right elbow as she is tender to palpation and abrasion noted to this aspect as well. We'll plan to provide patient's normal pain medication at home including her muscle relaxer and ibuprofen. Plan for discharge.  Xray of R hand does show proximal phalanx fx of 5th digit. Remainder of xrays negative.   On reevaluation pt resting comfortably, sleeping; easily arousable. Will plan for finger splint and ortho follow up. Pt recommended to take her regular  medication including ibuprofen and flexeril. She is in agreement with plan. She will follow up with her dentist regarding her chipped tooth. Stable for discharge home at this time.   This note was prepared using Dragon voice recognition software and may include unintentional dictation errors due to the inherent limitations of voice recognition software.  Final Clinical Impression(s) / ED Diagnoses Final diagnoses:  Fall, initial encounter  Closed nondisplaced fracture of proximal phalanx of right little finger, initial encounter  Musculoskeletal pain  Closed fracture of tooth, initial encounter    Rx / DC Orders ED Discharge Orders    None       Discharge Instructions     Your xrays did show a fracture of your pinky finger at the base near the knuckle. Please keep splint on and follow up with Dr. Magnus Ivan orthopedist for further evaluation. While at home please rest, ice, and elevate your hand to reduce pain/swelling. Continue taking your regular medications for pain.   Follow up with your dentist regarding your chipped tooth  Return to the ED for any worsening symptoms       Tanda Rockers, PA-C 06/11/20 1046    Terald Sleeper, MD 06/11/20 1718

## 2020-06-22 ENCOUNTER — Encounter: Payer: Self-pay | Admitting: Orthopaedic Surgery

## 2020-06-22 ENCOUNTER — Ambulatory Visit (INDEPENDENT_AMBULATORY_CARE_PROVIDER_SITE_OTHER): Payer: Medicaid Other | Admitting: Orthopaedic Surgery

## 2020-06-22 DIAGNOSIS — S62616A Displaced fracture of proximal phalanx of right little finger, initial encounter for closed fracture: Secondary | ICD-10-CM

## 2020-06-22 MED ORDER — TRAMADOL HCL 50 MG PO TABS: 50.0000 mg | ORAL_TABLET | Freq: Four times a day (QID) | ORAL | 0 refills | Status: DC | PRN

## 2020-06-22 NOTE — Progress Notes (Signed)
Office Visit Note   Patient: Diana Huynh           Date of Birth: [birth year]           MRN: [id number] Visit Date: 06/22/2020              Requested by: Quincy Simmonds, MD [address] [place],  [place] [address] PCP: Quincy Simmonds, MD   Assessment & Plan: Visit Diagnoses:  1. Closed displaced fracture of proximal phalanx of right little finger, initial encounter     Plan: Overall I feel this patient has a fracture and it is in acceptable alignment.  I would like to avoid surgery based on her lupus combined with her fibromyalgia and she agrees with this as well and would not want to have surgery.  I would like to see her back in 2 weeks with a repeat 3 views of her right hand.  Were going try an ulnar gutter removable splint.  All question concerns were answered and addressed.  I will send in some tramadol for pain.  Follow-Up Instructions: Return in about 2 weeks (around 07/06/2020).   Orders:  No orders of the defined types were placed in this encounter.  Meds ordered this encounter  Medications  . traMADol (ULTRAM) 50 MG tablet    Sig: Take 1-2 tablets (50-100 mg total) by mouth every 6 (six) hours as needed.    Dispense:  30 tablet    Refill:  0      Procedures: No procedures performed   Clinical Data: No additional findings.   Subjective: Chief Complaint  Patient presents with  . Right Little Finger - Fracture  The patient is a very pleasant 39 year old right-hand-dominant patient who is referred from emergency room after she injured her right fifth finger and a mechanical fall.  She is placed in a finger splint and given follow-up in her office.  She is having some pain in her fifth finger and her wrist.  She denies numbness and tingling.  She does have a history of lupus and fibromyalgia.  She is on medication as it relates to that.  She denies any other injuries as a relates to that fall.  HPI  Review of Systems She currently denies any headache, chest pain,  shortness of breath, fever, chills, nausea, vomiting  Objective: Vital Signs: There were no vitals taken for this visit.  Physical Exam She is alert and orient x3 in no acute distress Ortho Exam Examination of her right hand shows the splint is fitting nicely but I did remove it.  There is swelling at the MCP joint but no instability or rotational instability on exam. Specialty Comments:  No specialty comments available.  Imaging: No results found. X-rays reviewed of her right hand including 3 views show a base of the fifth metacarpal fracture.  PMFS History: Patient Active Problem List   Diagnosis Date Noted  . Respiratory infection 05/12/2020  . Right-sided chest pain 01/20/2020  . Hemoptysis 01/20/2020  . GERD (gastroesophageal reflux disease) 02/12/2019  . Pulmonary nodule less than 6 cm determined by computed tomography of lung 02/12/2019  . Precordial chest pain 02/05/2019  . Cough 01/06/2019  . Healthcare maintenance 07/25/2018  . Fibromyalgia 06/25/2018  . Acute non-recurrent frontal sinusitis 02/10/2018  . Viral pericarditis 01/06/2018  . SLE (systemic lupus erythematosus) (HCC) 12/31/2017  . Pericardial effusion    Past Medical History:  Diagnosis Date  . Bronchitis   . Fibromyalgia   . Lupus (HCC)   .  Pleuritis 12/31/2017    Family History  Problem Relation Age of Onset  . Hypertension Mother   . Heart murmur Mother   . Healthy Father   . Thyroid disease Sister   . Breast cancer Paternal Grandmother   . Breast cancer Paternal Aunt   . Heart failure Sister        has ICD  . Cervical cancer Sister     Past Surgical History:  Procedure Laterality Date  . ANKLE SURGERY    . APPENDECTOMY    . CESAREAN SECTION     Social History   Occupational History  . Not on file  Tobacco Use  . Smoking status: Current Every Day Smoker    Packs/day: 0.25    Types: Cigarettes    Last attempt to quit: 09/20/2018    Years since quitting: 1.7  . Smokeless tobacco:  Never Used  . Tobacco comment: 4-5 cigs/day.  Substance and Sexual Activity  . Alcohol use: Yes  . Drug use: No  . Sexual activity: Yes

## 2020-07-06 ENCOUNTER — Ambulatory Visit: Payer: Self-pay

## 2020-07-06 ENCOUNTER — Ambulatory Visit (INDEPENDENT_AMBULATORY_CARE_PROVIDER_SITE_OTHER): Payer: Medicaid Other | Admitting: Orthopaedic Surgery

## 2020-07-06 ENCOUNTER — Encounter: Payer: Self-pay | Admitting: Orthopaedic Surgery

## 2020-07-06 DIAGNOSIS — S62616A Displaced fracture of proximal phalanx of right little finger, initial encounter for closed fracture: Secondary | ICD-10-CM

## 2020-07-06 DIAGNOSIS — S62606D Fracture of unspecified phalanx of right little finger, subsequent encounter for fracture with routine healing: Secondary | ICD-10-CM

## 2020-07-06 NOTE — Progress Notes (Signed)
The patient is a 39 year old right-hand-dominant female who is now about a month into an injury to her right hand in which sustained a proximal phalanx fracture of the little finger.  There was some angulation to this fracture and it was extra-articular.  She opted to go with a nonoperative intervention and conservative treatment given her other comorbidities.  She reports that she is doing well overall.  On exam clinically there is no rotation deformity of the right hand little finger.  She cannot make a full fist yet but she can straighten her fingers and abduct them and adduct them.  3 views of the right hand show an angulated proximal phalanx fracture near the phalanx base that is extra-articular of the little finger.  She will continue her removable wrist splint and limit activities with that hand.  She can work on squeezing a small ball on occasion.  I would like to see her back in 4 weeks with an AP and lateral of the right fifth finger.

## 2020-08-03 ENCOUNTER — Ambulatory Visit (INDEPENDENT_AMBULATORY_CARE_PROVIDER_SITE_OTHER): Payer: Medicaid Other | Admitting: Orthopaedic Surgery

## 2020-08-03 ENCOUNTER — Ambulatory Visit (INDEPENDENT_AMBULATORY_CARE_PROVIDER_SITE_OTHER): Payer: Medicaid Other

## 2020-08-03 ENCOUNTER — Encounter: Payer: Self-pay | Admitting: Orthopaedic Surgery

## 2020-08-03 DIAGNOSIS — S62606D Fracture of unspecified phalanx of right little finger, subsequent encounter for fracture with routine healing: Secondary | ICD-10-CM

## 2020-08-03 NOTE — Progress Notes (Signed)
Office Visit Note   Patient: Diana Huynh           Date of Birth: Nov 17, 1980           MRN: [id number] Visit Date: 08/03/2020              Requested by: Quincy Simmonds, MD [address] [place],  [place] [address] PCP: Quincy Simmonds, MD   Assessment & Plan: Visit Diagnoses:  1. Closed displaced fracture of phalanx of right little finger with routine healing, subsequent encounter     Plan: She will continue to work on range of motion plan:Marland Kitchen No heavy lifting or strenuous gripping with the hand. She will continue to wear the splints for comfort. Follow-up with [place] in 1 month for repeat radiographs. Questions were encouraged and answered.   Follow-Up Instructions: Return in about 4 weeks (around 08/31/2020) for Radiographs.   Orders:  Orders Placed This Encounter  Procedures  . XR Finger Little Right   No orders of the defined types were placed in this encounter.     Procedures: No procedures performed   Clinical Data: No additional findings.   Subjective: Chief Complaint  Patient presents with  . Right Little Finger - Follow-up    HPI Mrs. Milledge returns today 8 weeks status post injury to her right hand resulting in a proximal phalanx fracture of the fifth finger. She has been working on range of motion. Feels that the finger is "some better." She has been wearing a removable wrist splint for the aluminum finger splint. Review of Systems Negative or noncontributory  Objective: Vital Signs: There were no vitals taken for this visit.  Physical Exam Neurological:     Mental Status: She is alert and oriented to person, place, and time.  Psychiatric:        Mood and Affect: Mood normal.     Ortho Exam Right finger she is able to straighten the finger to full extension. She has near full flexion of the right fifth finger. No malrotation. Specialty Comments:  No specialty comments available.  Imaging: XR Finger Little Right  Result Date: 08/03/2020 Right fifth  finger: No change in overall position alignment. Extra-articular base of the fifth proximal phalanx fracture with signs of consolidation. Fracture line still evident. No other fractures identified.    PMFS History: Patient Active Problem List   Diagnosis Date Noted  . Respiratory infection 05/12/2020  . Right-sided chest pain 01/20/2020  . Hemoptysis 01/20/2020  . GERD (gastroesophageal reflux disease) 02/12/2019  . Pulmonary nodule less than 6 cm determined by computed tomography of lung 02/12/2019  . Precordial chest pain 02/05/2019  . Cough 01/06/2019  . Healthcare maintenance 07/25/2018  . Fibromyalgia 06/25/2018  . Acute non-recurrent frontal sinusitis 02/10/2018  . Viral pericarditis 01/06/2018  . SLE (systemic lupus erythematosus) (HCC) 12/31/2017  . Pericardial effusion    Past Medical History:  Diagnosis Date  . Bronchitis   . Fibromyalgia   . Lupus (HCC)   . Pleuritis 12/31/2017    Family History  Problem Relation Age of Onset  . Hypertension Mother   . Heart murmur Mother   . Healthy Father   . Thyroid disease Sister   . Breast cancer Paternal Grandmother   . Breast cancer Paternal Aunt   . Heart failure Sister        has ICD  . Cervical cancer Sister     Past Surgical History:  Procedure Laterality Date  . ANKLE SURGERY    .  APPENDECTOMY    . CESAREAN SECTION     Social History   Occupational History  . Not on file  Tobacco Use  . Smoking status: Current Every Day Smoker    Packs/day: 0.25    Types: Cigarettes    Last attempt to quit: 09/20/2018    Years since quitting: 1.8  . Smokeless tobacco: Never Used  . Tobacco comment: 4-5 cigs/day.  Substance and Sexual Activity  . Alcohol use: Yes  . Drug use: No  . Sexual activity: Yes

## 2020-08-31 ENCOUNTER — Encounter: Payer: Self-pay | Admitting: Orthopaedic Surgery

## 2020-08-31 ENCOUNTER — Ambulatory Visit (INDEPENDENT_AMBULATORY_CARE_PROVIDER_SITE_OTHER): Payer: Medicaid Other | Admitting: Orthopaedic Surgery

## 2020-08-31 ENCOUNTER — Ambulatory Visit (INDEPENDENT_AMBULATORY_CARE_PROVIDER_SITE_OTHER): Payer: Medicaid Other

## 2020-08-31 DIAGNOSIS — S62606D Fracture of unspecified phalanx of right little finger, subsequent encounter for fracture with routine healing: Secondary | ICD-10-CM

## 2020-08-31 NOTE — Progress Notes (Signed)
The patient is getting close to 3 months status post a right fifth finger fracture of the proximal findings.  She says she is doing well and can then the finger without much discomfort at all.  She reports some swelling.  She has been able to use her hand completely.  On examination of her right hand there is no rotational formally when she goes from a fully extended position of all fingers to flex position making a fist.  There is just a slight flexion contracture of the PIP joint of the fifth finger that is easily correctable.  3 views of the hand showing that fifth finger show a healing base of the proximal phalanx fracture with some angulation.  There is been interval healing.  This point since she is doing so well follow-up can be as needed.  If she ends up having any issues she will let us know.  She will still be careful with heavy gripping and lifting activities for at least the next 4 weeks.

## 2020-09-26 ENCOUNTER — Telehealth: Payer: Self-pay

## 2020-09-26 MED ORDER — BENZONATATE 200 MG PO CAPS: 200.0000 mg | ORAL_CAPSULE | Freq: Three times a day (TID) | ORAL | 0 refills | Status: DC

## 2020-09-26 NOTE — Telephone Encounter (Signed)
Returned call to patient.  Confirmed Identity by DOB and name.   Feels like always catching a cold.  OTC meds don't work for her.  She notes good results with Tessalon perles for cough.  Cough is getting painful.  This is normal for her with a cold.  Will send in a new Rx.   She also requests [facility] call her back to schedule an appointment with new PCP.   Debe Coder, MD

## 2020-09-26 NOTE — Telephone Encounter (Signed)
Requesting med for cough. Please call pt back.  

## 2020-10-05 ENCOUNTER — Encounter: Payer: Medicaid Other | Admitting: Student

## 2021-03-29 ENCOUNTER — Telehealth: Payer: Self-pay | Admitting: *Deleted

## 2021-03-29 NOTE — Telephone Encounter (Signed)
I agree, thank you.

## 2021-03-29 NOTE — Telephone Encounter (Signed)
Call from pt - stated she feels like she has a cold; c/o body aches, chest pain,cough. Stated congestion started 3 days ago - tried Mucinex. C/o pain upper back, legs like muscle spasms.  She cannot lie flat - she feels worse. Hx of lupus , fibromyalgia, pericarditis - stated she feels one of these is flaring up.also c/o dizziness last 2 days. No available appts - instructed pt to go to UC or ED - stated she was not going to the ED b/c last time she went , she had to wait a very long time. She asked if UC does chest x-rays, informed pt they do. She also requested to schedule an appt to see her doctor next week. Informed Dr Elaina Pattee will not be here but she can see one of her colleagues - appt schedule with Dr Ned Card on Wed 7/6 @ 1015 AM. Advised pt to go to [facility] or ED to be evaluated, not to wait especially if her symptoms worsen- pt voiced understanding.

## 2021-03-30 ENCOUNTER — Emergency Department (HOSPITAL_COMMUNITY)
Admission: EM | Admit: 2021-03-30 | Discharge: 2021-03-30 | Disposition: A | Discharge status: Home / Self Care | Payer: Medicaid Other | Attending: Emergency Medicine | Admitting: Emergency Medicine

## 2021-03-30 ENCOUNTER — Ambulatory Visit (HOSPITAL_COMMUNITY)
Admission: EM | Admit: 2021-03-30 | Discharge: 2021-03-30 | Disposition: A | Discharge status: Home / Self Care | Payer: Medicaid Other

## 2021-03-30 ENCOUNTER — Encounter (HOSPITAL_COMMUNITY): Payer: Self-pay

## 2021-03-30 ENCOUNTER — Other Ambulatory Visit: Payer: Self-pay

## 2021-03-30 ENCOUNTER — Encounter (HOSPITAL_COMMUNITY): Payer: Self-pay | Admitting: Emergency Medicine

## 2021-03-30 ENCOUNTER — Emergency Department (HOSPITAL_COMMUNITY): Payer: Medicaid Other

## 2021-03-30 ENCOUNTER — Ambulatory Visit (INDEPENDENT_AMBULATORY_CARE_PROVIDER_SITE_OTHER): Payer: Medicaid Other

## 2021-03-30 DIAGNOSIS — Z20822 Contact with and (suspected) exposure to covid-19: Secondary | ICD-10-CM | POA: Diagnosis not present

## 2021-03-30 DIAGNOSIS — R079 Chest pain, unspecified: Secondary | ICD-10-CM

## 2021-03-30 DIAGNOSIS — R0789 Other chest pain: Secondary | ICD-10-CM | POA: Insufficient documentation

## 2021-03-30 DIAGNOSIS — Z5321 Procedure and treatment not carried out due to patient leaving prior to being seen by health care provider: Secondary | ICD-10-CM | POA: Diagnosis not present

## 2021-03-30 DIAGNOSIS — R0602 Shortness of breath: Secondary | ICD-10-CM | POA: Insufficient documentation

## 2021-03-30 DIAGNOSIS — R059 Cough, unspecified: Secondary | ICD-10-CM

## 2021-03-30 HISTORY — DX: Disease of pericardium, unspecified: I31.9

## 2021-03-30 LAB — CBC
HCT: 35.7 % — ABNORMAL LOW (ref 36.0–46.0)
Hemoglobin: 11.8 g/dL — ABNORMAL LOW (ref 12.0–15.0)
MCH: 28 pg (ref 26.0–34.0)
MCHC: 33.1 g/dL (ref 30.0–36.0)
MCV: 84.8 fL (ref 80.0–100.0)
Platelets: 196 10*3/uL (ref 150–400)
RBC: 4.21 MIL/uL (ref 3.87–5.11)
RDW: 16.3 % — ABNORMAL HIGH (ref 11.5–15.5)
WBC: 5.3 10*3/uL (ref 4.0–10.5)
nRBC: 0 % (ref 0.0–0.2)

## 2021-03-30 LAB — BASIC METABOLIC PANEL
Anion gap: 6 (ref 5–15)
BUN: 8 mg/dL (ref 6–20)
CO2: 24 mmol/L (ref 22–32)
Calcium: 9 mg/dL (ref 8.9–10.3)
Chloride: 106 mmol/L (ref 98–111)
Creatinine, Ser: 0.85 mg/dL (ref 0.44–1.00)
GFR, Estimated: >60 mL/min (ref >60)
Glucose, Bld: 100 mg/dL — ABNORMAL HIGH (ref 70–99)
Potassium: 3.5 mmol/L (ref 3.5–5.1)
Sodium: 136 mmol/L (ref 135–145)

## 2021-03-30 LAB — RESP PANEL BY RT-PCR (FLU A&B, COVID) ARPGX2
Influenza A by PCR: NEGATIVE
Influenza B by PCR: NEGATIVE
SARS Coronavirus 2 by RT PCR: NEGATIVE

## 2021-03-30 LAB — TROPONIN I (HIGH SENSITIVITY): Troponin I (High Sensitivity): <2 ng/L (ref <18)

## 2021-03-30 LAB — I-STAT BETA HCG BLOOD, ED (MC, WL, AP ONLY): I-stat hCG, quantitative: <5 m[IU]/mL (ref <5)

## 2021-03-30 NOTE — ED Notes (Signed)
Patient is being discharged from the Urgent Care and sent to the Emergency Department via POV. Per Evern Core PA, patient is in need of higher level of care due to need for chest pain workup. Patient is aware and verbalizes understanding of plan of care.  Vitals:   03/30/21 1325  BP: (!) 158/93  Pulse: (!) 110  Resp: (!) 24  Temp: 99.5 F (37.5 C)  SpO2: 100%

## 2021-03-30 NOTE — ED Provider Notes (Signed)
Emergency Medicine Provider Triage Evaluation Note  Diana Huynh , a 40 y.o. female  was evaluated in triage.  Pt complains of chest pain, shortness of breath, nasal congestion, rhinorrhea, sore throat, and productive cough.  Patient reports that he has congestion, moderate, sore throat, cough has been present x3 days.  Patient has been exposed to family members who are sick with similar symptoms.  Members were not tested for COVID-19 or influenza.  Patient has not received vaccination for COVID-19.  Patient has received flu shot.  Reports that she has had chest pain and shortness of breath since yesterday.  Chest pain is located on the left side of her chest described as a tightness and squeezing.  Chest pain is intermittent.  Pain is worse with exertion.  No associated nausea, vomiting, or diaphoresis.  Patient also reports pain to the right side of her chest.  This pain to the right side of her chest is only present when coughing.  Patient describes this pain as a tightness.  Review of Systems  Positive: Nasal congestion, rhinorrhea, sore throat, productive cough, chest pain, shortness of breath, diarrhea, rashes, loss of smell/taste Negative: Leg swelling, palpitations, nausea, vomiting, abdominal pain, troubles with, high potato voice  Physical Exam  BP (!) 139/91 (BP Location: Left Arm)   Pulse 96   Temp 99.2 F (37.3 C) (Oral)   Resp 16   LMP 03/10/2021   SpO2 100%  Gen:   Awake, no distress   Resp:  Normal effort, clear to auscultation bilaterally MSK:   Moves extremities without difficulty  Other:  Patient able to handle oral secretions without difficulty.  Mild erythema noted to oropharynx, no peritonsillar abscess, angioedema, tonsils +2 bilaterally, no exudate noted to tonsils bilaterally  Medical Decision Making  Medically screening exam initiated at 4:03 PM.  Appropriate orders placed.  Diana Huynh was informed that the remainder of the evaluation will be completed by another  provider, this initial triage assessment does not replace that evaluation, and the importance of remaining in the ED until their evaluation is complete.  The patient appears stable so that the remainder of the work up may be completed by another provider.      Haskel Schroeder, PA-C 03/30/21 1616    Linwood Dibbles, MD 03/31/21 [id number]

## 2021-03-30 NOTE — Discharge Instructions (Addendum)
Sent to ED for further evaluation of chest pains

## 2021-03-30 NOTE — ED Provider Notes (Signed)
[facility]    CSN: [id number] Arrival date & time: 03/30/21  1315      History   Chief Complaint Chief Complaint  Patient presents with   Nasal Congestion   Chest Pain   Shortness of Breath   Cough   Back Pain   Headache   Dizziness    HPI Diana Huynh is a 40 y.o. female.   Patient here with history of SLE and Fibromyalgia, here c/w chest pain, SOB x yesterday.  Admits nasal congestion, rhinorrhea, sore throat, cough x 3 days.  She reports history of pericarditis, most recently last year.  She denies history of PE/DVT or other blood clots.  Denies hemoptysis.  Denies DM, HTN, HLD.  Chest pain is sharp and radiates to left arm.  She has taken muscle relaxer and lyrica w/o improvement in pain.   Past Medical History:  Diagnosis Date   Bronchitis    Fibromyalgia    Lupus (HCC)    Pericarditis    per pt report   Pleuritis 12/31/2017    Patient Active Problem List   Diagnosis Date Noted   Respiratory infection 05/12/2020   Right-sided chest pain 01/20/2020   Hemoptysis 01/20/2020   GERD (gastroesophageal reflux disease) 02/12/2019   Pulmonary nodule less than 6 cm determined by computed tomography of lung 02/12/2019   Precordial chest pain 02/05/2019   Cough 01/06/2019   Healthcare maintenance 07/25/2018   Fibromyalgia 06/25/2018   Tobacco abuse 03/07/2018   Long-term use of Plaquenil 03/06/2018   Acute non-recurrent frontal sinusitis 02/10/2018   Viral pericarditis 01/06/2018   SLE (systemic lupus erythematosus) (HCC) 12/31/2017   Pericardial effusion     Past Surgical History:  Procedure Laterality Date   ANKLE SURGERY     APPENDECTOMY     CESAREAN SECTION      OB History   No obstetric history on file.      Home Medications    Prior to Admission medications   Medication Sig Start Date End Date Taking? Authorizing Provider  albuterol (VENTOLIN HFA) 108 (90 Base) MCG/ACT inhaler Inhale 1-2 puffs into the lungs every 6 (six) hours as  needed for wheezing or shortness of breath. 05/07/20  Yes Yu, Amy V, PA-C  alum & mag hydroxide-simeth (MAALOX/MYLANTA) 200-200-20 MG/5ML suspension Take 15 mLs by mouth every 6 (six) hours as needed for indigestion or heartburn.   Yes [provider]  Baclofen 5 MG TABS Take by mouth in the morning and at bedtime.   Yes [provider]  famotidine (PEPCID) 20 MG tablet Take 20 mg by mouth daily.    Yes [provider]  hydroxychloroquine (PLAQUENIL) 200 MG tablet Take 200-400 mg by mouth daily. Take 200mg  every other day alternating with 400 mg   Yes [provider]  ibuprofen (ADVIL) 600 MG tablet Take 1 tablet (600 mg total) by mouth every 6 (six) hours as needed. 06/25/19  Yes Venter, Margaux, PA-C  pregabalin (LYRICA) 150 MG capsule Take 150 mg by mouth 2 (two) times daily.   Yes [provider]  benzonatate (TESSALON) 200 MG capsule Take 1 capsule (200 mg total) by mouth every 8 (eight) hours. 09/26/20   09/28/20, MD  traMADol (ULTRAM) 50 MG tablet Take 1-2 tablets (50-100 mg total) by mouth every 6 (six) hours as needed. 06/22/20   06/24/20, MD    Family History Family History  Problem Relation Age of Onset   Hypertension Mother    Heart  murmur Mother    Healthy Father    Thyroid disease Sister    Breast cancer Paternal Grandmother    Breast cancer Paternal Aunt    Heart failure Sister        has ICD   Cervical cancer Sister     Social History Social History   Tobacco Use   Smoking status: Every Day    Packs/day: 0.25    Pack years: 0.00    Types: Cigarettes    Last attempt to quit: 09/20/2018    Years since quitting: 2.5   Smokeless tobacco: Never   Tobacco comments:    4-5 cigs/day.  Substance Use Topics   Alcohol use: Not Currently   Drug use: No     Allergies   Patient has no known allergies.   Review of Systems Review of Systems  Constitutional:  Negative for chills, fatigue and fever.  HENT:   Positive for congestion and rhinorrhea. Negative for ear pain, nosebleeds, postnasal drip, sinus pressure, sinus pain and sore throat.   Eyes:  Negative for pain and redness.  Respiratory:  Positive for cough and shortness of breath. Negative for wheezing.   Cardiovascular:  Positive for chest pain and leg swelling. Negative for palpitations.  Gastrointestinal:  Negative for abdominal pain, diarrhea, nausea and vomiting.  Musculoskeletal:  Positive for arthralgias. Negative for myalgias.  Skin:  Negative for rash.  Neurological:  Negative for light-headedness and headaches.  Hematological:  Negative for adenopathy. Does not bruise/bleed easily.  Psychiatric/Behavioral:  Negative for confusion and sleep disturbance.     Physical Exam Triage Vital Signs ED Triage Vitals  Enc Vitals Group     BP 03/30/21 1325 (!) 158/93     Pulse Rate 03/30/21 1325 (!) 110     Resp 03/30/21 1325 (!) 24     Temp 03/30/21 1325 99.5 F (37.5 C)     Temp src --      SpO2 03/30/21 1325 100 %     Weight --      Height --      Head Circumference --      Peak Flow --      Pain Score 03/30/21 1326 6     Pain Loc --      Pain Edu? --      Excl. in GC? --    No data found.  Updated Vital Signs BP (!) 158/93   Pulse (!) 110   Temp 99.5 F (37.5 C)   Resp (!) 24   LMP 03/10/2021   SpO2 100%   Visual Acuity Right Eye Distance:   Left Eye Distance:   Bilateral Distance:    Right Eye Near:   Left Eye Near:    Bilateral Near:     Physical Exam Vitals and nursing note reviewed.  Constitutional:      General: She is in acute distress.     Appearance: Normal appearance. She is well-developed. She is not ill-appearing or toxic-appearing.     Comments: Arm covering chest  in pain  HENT:     Head: Normocephalic and atraumatic.     Right Ear: Tympanic membrane and ear canal normal.     Left Ear: Tympanic membrane and ear canal normal.     Nose: Congestion and rhinorrhea present.     Mouth/Throat:      Pharynx: No oropharyngeal exudate or posterior oropharyngeal erythema.  Eyes:     General: No scleral icterus.    Extraocular Movements: Extraocular movements  intact.     Conjunctiva/sclera: Conjunctivae normal.  Cardiovascular:     Rate and Rhythm: Regular rhythm. Tachycardia present.     Heart sounds: No murmur heard. Pulmonary:     Effort: Pulmonary effort is normal. No respiratory distress.     Breath sounds: Normal breath sounds. No wheezing or rales.     Comments: Speaking in full and complete sentences, occassionally stops for deep breaths. Chest:     Chest wall: Tenderness present.    Musculoskeletal:     Cervical back: Normal range of motion. No rigidity.     Right lower leg: Tenderness (calf) present. No swelling. No edema.     Left lower leg: Tenderness (calf) present. No swelling. No edema.  Skin:    Capillary Refill: Capillary refill takes less than 2 seconds.     Coloration: Skin is not jaundiced.     Findings: No rash.  Neurological:     General: No focal deficit present.     Mental Status: She is alert and oriented to person, place, and time.     Motor: No weakness.     Gait: Gait normal.  Psychiatric:        Mood and Affect: Mood normal.        Behavior: Behavior normal.     UC Treatments / Results  Labs (all labs ordered are listed, but only abnormal results are displayed) Labs Reviewed - No data to display  EKG   Radiology DG Chest 2 View  Result Date: 03/30/2021 CLINICAL DATA:  Chest pain and shortness of breath EXAM: CHEST - 2 VIEW COMPARISON:  05/13/2020 FINDINGS: The heart size and mediastinal contours are within normal limits. Both lungs are clear. The visualized skeletal structures are unremarkable. IMPRESSION: No active cardiopulmonary disease. Electronically Signed   By: Alcide Clever M.D.   On: 03/30/2021 14:29    Procedures Procedures (including critical care time)  Medications Ordered in UC Medications - No data to  display  Initial Impression / Assessment and Plan / UC Course  I have reviewed the triage vital signs and the nursing notes.  Pertinent labs & imaging results that were available during my care of the patient were reviewed by me and considered in my medical decision making (see chart for details).     Patient continues to be in acute distress, sent to ED for further evaluation  Final Clinical Impressions(s) / UC Diagnoses   Final diagnoses:  Chest pain, unspecified type  SOB (shortness of breath)     Discharge Instructions      Sent to ED for further evaluation of chest pains      ED Prescriptions   None    PDMP not reviewed this encounter.   Evern Core, PA-C 03/30/21 1446

## 2021-03-30 NOTE — ED Notes (Signed)
Pt reported worsening in pain

## 2021-03-30 NOTE — ED Triage Notes (Signed)
Patient states she has generalized pain every day due to SLE, but states pain became unbearable yesterday and also reports shortness of breath and chest pain that increased when she bent over to pick up laundry last night. Was seen at [facility], sent to [facility] for further evaluation. Patient alert, oriented, and in no apparent distress at this time.

## 2021-03-30 NOTE — ED Triage Notes (Signed)
Pt reports shortness of breath, congestion, body aches, upper back pain, generalized muscle spasms, cough starting 3 days ago and chest pain started yesterday. Reports pain is on right side and left side, right side hurts more with movement and breathing, left side hurts at rest. Reports dizziness and worsening of chest pain when bending over.   Reports history pericarditis.

## 2021-03-30 NOTE — ED Notes (Signed)
No reply for room x3 Not seen in lobby. 

## 2021-03-31 LAB — TROPONIN I (HIGH SENSITIVITY): Troponin I (High Sensitivity): 2 ng/L (ref <18)

## 2021-04-04 ENCOUNTER — Encounter: Payer: Self-pay | Admitting: *Deleted

## 2021-04-05 ENCOUNTER — Other Ambulatory Visit: Payer: Self-pay

## 2021-04-05 ENCOUNTER — Ambulatory Visit (HOSPITAL_COMMUNITY)
Admission: RE | Admit: 2021-04-05 | Discharge: 2021-04-05 | Disposition: A | Discharge status: Home / Self Care | Payer: Medicaid Other | Source: Ambulatory Visit | Attending: Internal Medicine | Admitting: Internal Medicine

## 2021-04-05 ENCOUNTER — Ambulatory Visit: Payer: Medicaid Other | Admitting: Internal Medicine

## 2021-04-05 VITALS — BP 151/71 | HR 100 | Temp 98.1°F | Ht 61.0 in | Wt 183.9 lb

## 2021-04-05 DIAGNOSIS — M329 Systemic lupus erythematosus, unspecified: Secondary | ICD-10-CM | POA: Insufficient documentation

## 2021-04-05 DIAGNOSIS — M797 Fibromyalgia: Secondary | ICD-10-CM | POA: Diagnosis not present

## 2021-04-05 DIAGNOSIS — I301 Infective pericarditis: Secondary | ICD-10-CM | POA: Insufficient documentation

## 2021-04-05 DIAGNOSIS — I319 Disease of pericardium, unspecified: Secondary | ICD-10-CM | POA: Insufficient documentation

## 2021-04-05 DIAGNOSIS — I309 Acute pericarditis, unspecified: Secondary | ICD-10-CM | POA: Diagnosis not present

## 2021-04-05 DIAGNOSIS — R072 Precordial pain: Secondary | ICD-10-CM | POA: Insufficient documentation

## 2021-04-05 MED ORDER — COLCHICINE 0.6 MG PO TABS: 0.6000 mg | ORAL_TABLET | Freq: Two times a day (BID) | ORAL | 0 refills | Status: DC

## 2021-04-05 MED ORDER — LIDOCAINE 5 % EX PTCH: 1.0000 | MEDICATED_PATCH | CUTANEOUS | 0 refills | Status: AC

## 2021-04-05 MED ORDER — IBUPROFEN 800 MG PO TABS: 800.0000 mg | ORAL_TABLET | Freq: Three times a day (TID) | ORAL | 0 refills | Status: AC

## 2021-04-05 MED ORDER — LIDOCAINE 5 % EX PTCH: 1.0000 | MEDICATED_PATCH | CUTANEOUS | 0 refills | Status: DC

## 2021-04-05 MED ORDER — KETOROLAC TROMETHAMINE 30 MG/ML IJ SOLN
30.0000 mg | Freq: Once | INTRAMUSCULAR | Status: AC
  Administered 2021-04-05: 30 mg via INTRAMUSCULAR

## 2021-04-05 MED ORDER — IBUPROFEN 800 MG PO TABS: 800.0000 mg | ORAL_TABLET | Freq: Three times a day (TID) | ORAL | 0 refills | Status: DC

## 2021-04-05 NOTE — Telephone Encounter (Signed)
Pt states the doctor that wrote the Rx for her is not medicaid enrolled. Please call back.

## 2021-04-05 NOTE — Assessment & Plan Note (Addendum)
Patient presenting today with pleuritic chest pain and pressure that began about 1 week ago. Patient was initially seen in ER for this pain, but left due to long wait. EKG and troponin in the ED were normal. Patient states that her chest pain is worse with movements and coughing. Nothing seems to make the pain better. She is unable to lie flat due to the pain and SOB. She has a hx of pericarditis, last flare was about 1 year ago. EKG was performed in the office today and showed diffuse T wave changes, but no ST elevations, thus low suspicion for an MI. Symptoms are likely secondary to a pericarditis flare. Pt was hypertensive, thus low suspicion for tamponade as well.  Gave patient Toradol shot in office for her pain. Sent home with Colchicine 0.6mg  BID for 3 months, Ibuprofen 800mg  for 1 week, and Lidocaine patches. Will follow up in 1 week for evaluation of chest pain and repeat EKG.   Plan: Prescribed Colchicine, Ibuprofen, and Lidocaine patches Follow up 1 week

## 2021-04-05 NOTE — Telephone Encounter (Signed)
New scripts sent

## 2021-04-05 NOTE — Progress Notes (Signed)
   CC: chest pain  HPI:  [name] is a 40 y.o. with a PMHx of lupus, fibromyalgia, and pericarditis presenting to the [facility] today for chest pain and body aches.   Please see problem based assessment and plan for additional details.   Past Medical History:  Diagnosis Date   Bronchitis    Fibromyalgia    Lupus (HCC)    Pericarditis    per pt report   Pleuritis 12/31/2017   Review of Systems:  as per HPI    Vitals:   04/05/21 1010  BP: (!) 151/71  Pulse: 100  Temp: 98.1 F (36.7 C)  TempSrc: Oral  SpO2: 100%  Weight: 183 lb 14.4 oz (83.4 kg)  Height: 5\' 1"  (1.549 m)   Physical Exam: General: Mild distress.  Cardiovascular: Cardiac rub appreciated. Regular rate and rhythm. Pulm: Lungs CTA bilaterally. No rales, wheezes, or rhonchi Abd: No abd tenderness. No hernia appreciated MSK: Paraspinal and lumbar tenderness Skin: Warm and dry Neuro: Alert and oriented x4. Psych: Normal judgement and behavior   EKG interpretation: Normal sinus rhythm at 90bpm, diffuse T wave abnormalities. No ST elevations. Compared to prior EKG from 6/30, there are now apparent ST wave changes that were not noticed previously.   Assessment & Plan:   See Encounters Tab for problem based charting.  Patient seen with Dr. 7/30

## 2021-04-05 NOTE — Patient Instructions (Addendum)
Thank you, Ms.Jahmya Marlett for allowing [place] to provide your care today. Today we discussed your chest pain and fibromyalgia.    Chest pain likely secondary to pericarditis flare: We repeated an EKG today in the clinic. We will be treating you for a pericarditis flare. You received a shot of Toradol for your pain today and will be sent home with ibuprofen which you will take 3 times a day for 1 week, colchicine which you will take twice daily for 3 months, and lidocaine which you can use as needed for chest pain.  Fibromyalgia: Continue with your lyrica and baclofen for now.  Lupus: Follow with your rheumatologist on 8/4.   I have ordered the following labs for you:  Lab Orders  No laboratory test(s) ordered today     Tests ordered today:  EKG  Referrals ordered today:   Referral Orders  No referral(s) requested today     I have ordered the following medication/changed the following medications:   Stop the following medications: Medications Discontinued During This Encounter  Medication Reason   ibuprofen (ADVIL) 600 MG tablet      Start the following medications: Meds ordered this encounter  Medications   ketorolac (TORADOL) 30 MG/ML injection 30 mg   colchicine 0.6 MG tablet    Sig: Take 1 tablet (0.6 mg total) by mouth 2 (two) times daily.    Dispense:  180 tablet    Refill:  0   lidocaine (LIDODERM) 5 %    Sig: Place 1 patch onto the skin daily for 6 days. Remove & Discard patch within 12 hours or as directed by MD    Dispense:  6 patch    Refill:  0   ibuprofen (ADVIL) 800 MG tablet    Sig: Take 1 tablet (800 mg total) by mouth 3 (three) times daily for 7 days.    Dispense:  21 tablet    Refill:  0     Follow up:  1 week . We will repeat an EKG and evaluate your pain at this time.   Remember: To pick up your medications for your pericarditis.  Should you have any questions or concerns please call the internal medicine clinic at [phone].    Elza Rafter,  D.O. [facility]

## 2021-04-05 NOTE — Progress Notes (Signed)
Internal Medicine Clinic Attending ° °I saw and evaluated the patient.  I personally confirmed the key portions of the history and exam documented by Dr. Atway and I reviewed pertinent patient test results.  The assessment, diagnosis, and plan were formulated together and I agree with the documentation in the resident’s note.  °

## 2021-04-05 NOTE — Assessment & Plan Note (Deleted)
Patient presenting today with pleuritic chest pain that began about 1 week ago. Chest pain is worse with movements and coughing. Patient is unable to lie flat due to the pain and SOB. She has a hx of pericarditis, last flare was about 1 year ago. EKG was performed in the office today and showed diffuse T wave changes, but no ST elevations. Low suspicion for an MI. Pt was hypertensive and does not have muffled heart sounds, thus low suspicion for tamponade as well.  Gave patient Toradol shot in office for her pain. Sent in Colchicine 0.6mg  BID for 3 months, Ibuprofen 800mg  for 1 week, and Lidocaine patches. Will follow up in 1 week for evaluation of chest pain and repeat EKG.

## 2021-04-05 NOTE — Assessment & Plan Note (Addendum)
Patient complaining of diffuse body aches consistent with fibromyalgia. Continue with Lyrica and Baclofen, as patient is following with pain management. Will hold off on any new medications until pericarditis and chest pain are resolved.   Plan: Continue to follow with pain management and rheum

## 2021-04-07 ENCOUNTER — Telehealth: Payer: Self-pay | Admitting: *Deleted

## 2021-04-07 NOTE — Telephone Encounter (Signed)
Information was sent through Cover[employer] for [place] for Colchicine 0.6 mg tablets and Lidocaine 5% Patches.  Lidocaine 5% Patches and Colchicine 0.6 mg tablets were approved 04/07/2021 thru 04/07/2022.  Angelina Ok, RN 04/07/2021 10:05 AM.

## 2021-04-12 ENCOUNTER — Encounter: Payer: Self-pay | Admitting: Internal Medicine

## 2021-04-12 ENCOUNTER — Other Ambulatory Visit: Payer: Self-pay

## 2021-04-12 ENCOUNTER — Ambulatory Visit (HOSPITAL_COMMUNITY)
Admission: RE | Admit: 2021-04-12 | Discharge: 2021-04-12 | Disposition: A | Discharge status: Home / Self Care | Payer: Medicaid Other | Source: Ambulatory Visit | Attending: Family Medicine | Admitting: Family Medicine

## 2021-04-12 ENCOUNTER — Ambulatory Visit: Payer: Medicaid Other | Admitting: Internal Medicine

## 2021-04-12 VITALS — BP 118/67 | HR 72 | Temp 98.0°F | Ht 61.0 in | Wt 190.0 lb

## 2021-04-12 DIAGNOSIS — I309 Acute pericarditis, unspecified: Secondary | ICD-10-CM

## 2021-04-12 DIAGNOSIS — Z0000 Encounter for general adult medical examination without abnormal findings: Secondary | ICD-10-CM

## 2021-04-12 MED ORDER — DICLOFENAC SODIUM 1 % EX GEL: 4.0000 g | CUTANEOUS | 1 refills | Status: DC | PRN

## 2021-04-12 MED ORDER — DICLOFENAC SODIUM 1 % EX GEL: 2.0000 g | CUTANEOUS | 1 refills | Status: DC | PRN

## 2021-04-12 NOTE — Assessment & Plan Note (Signed)
Patient will schedule Pap smear with her PCP to be done next month.

## 2021-04-12 NOTE — Progress Notes (Signed)
   CC: pericarditis follow up   HPI:  [name] is a 40 y.o. female with a PMHx of lupus, fibromyalgia, pericarditis.  She is presenting to the [facility] for a 1 week follow-up of pericarditis. At the last visit the patient had an EKG done which was concerning for possible pericarditis.  Given the patient's history of pericarditis the past we went ahead and treated her appropriately.  She was put on ibuprofen for 1 week as well as 3 months of colchicine therapy and lidocaine patches to use as needed for her chest pain. Today she reports that her pain is about 25% improved.  She still has occasional shortness of breath associated with her pain, however, she notes that she is doing better.  Patient denies any headaches, lightheadedness, dizziness, abdominal pain, nausea, vomiting, diarrhea, or any other symptoms at this time. She does note that she has had a 7lb weight gain in the last week despite having a low appetite.   Past Medical History:  Diagnosis Date   Bronchitis    Fibromyalgia    Lupus (HCC)    Pericarditis    per pt report   Pleuritis 12/31/2017   Review of Systems:  Review of Systems  Constitutional:  Negative for chills and fever.  HENT:  Negative for congestion, sore throat and tinnitus.   Eyes:  Negative for blurred vision and double vision.  Respiratory:  Positive for shortness of breath. Negative for wheezing.   Cardiovascular:  Positive for chest pain. Negative for palpitations and leg swelling.  Gastrointestinal:  Negative for abdominal pain, diarrhea, nausea and vomiting.  Genitourinary:  Negative for dysuria and urgency.  Musculoskeletal:  Positive for joint pain. Negative for back pain and myalgias.  Neurological:  Negative for dizziness and headaches.  Psychiatric/Behavioral:  Negative for depression. The patient is not nervous/anxious.     Physical Exam:  There were no vitals filed for this visit. Physical Exam Constitutional:      Appearance: She is normal  weight.  HENT:     Head: Normocephalic and atraumatic.  Eyes:     Pupils: Pupils are equal, round, and reactive to light.  Cardiovascular:     Rate and Rhythm: Normal rate and regular rhythm.     Pulses: Normal pulses.     Heart sounds: Normal heart sounds.     Comments: Cardiac rub resolved.  Pulmonary:     Effort: Pulmonary effort is normal. No respiratory distress.     Breath sounds: Normal breath sounds. No wheezing, rhonchi or rales.  Abdominal:     General: Bowel sounds are normal.     Palpations: Abdomen is soft.     Tenderness: There is no abdominal tenderness.  Musculoskeletal:     Right lower leg: No edema.     Left lower leg: No edema.  Skin:    General: Skin is warm and dry.  Neurological:     General: No focal deficit present.     Mental Status: She is alert and oriented to person, place, and time.  Psychiatric:        Mood and Affect: Mood normal.        Behavior: Behavior normal.     Assessment & Plan:   See Encounters Tab for problem based charting.  Patient seen with Dr. Criselda Peaches

## 2021-04-12 NOTE — Assessment & Plan Note (Addendum)
Patient was seen 1 week ago for pleuritic chest pain that was thought to be due to a pericarditis infection.  She states that her chest pain is about 25% improved.  Does still occasionally have some shortness of breath associated with this pain however it is improving. Repeat EKG done in the clinic today is also improved.  There are P wave inversions in lead I, however, this is thought to be due to lead reversal. T wave changes appear to be improved as compared to the last EKG 1 week ago.  Patient finished 1 week course of ibuprofen and will continue colchicine therapy for 3 months.  She reports that the lidocaine patches did not help her chest pain so we will try Voltaren gel now.  Patient also makes note that she has gained 7 pounds in the last week and is unsure what could be causing this, as her appetite has been lower than usual.  Gust with the patient at length that we do not believe this is due to her pericarditis therapy however we will continue to monitor.  Plan: - Follow up with PCP in 1 mo  - Voltargen gel PRN for chest pain - Continue colchicine therapy for 3 mos  - Consider referral back to cardiologist if pain does not continue to improve

## 2021-04-12 NOTE — Patient Instructions (Signed)
Thank you, [name] for allowing [place] to provide your care today. Today we discussed pericarditis. Pericarditis/chest pain: I am glad that your chest pain is beginning to improve.  I am sending in a prescription for Voltaren gel for you to use as needed for your pain.  Continue to take the colchicine as prescribed, this will be finished after 3 months. Healthcare maintenance: These make an appointment with your PCP for your Pap smear.  You can also follow-up with her regarding her chest pain at this time and hopefully it is improving even more.  I have ordered the following labs for you:  Lab Orders  No laboratory test(s) ordered today     Tests ordered today:  EKG  Referrals ordered today:   Referral Orders  No referral(s) requested today     I have ordered the following medication/changed the following medications:   Stop the following medications: There are no discontinued medications.   Start the following medications: Meds ordered this encounter  Medications   diclofenac Sodium (VOLTAREN) 1 % GEL    Sig: Apply 4 g topically as needed.    Dispense:  4 g    Refill:  1     Follow up:  1 month     Remember: To make an appt with your PCP for your pap smear! The voltaren gel will be available for you to pick up at your pharmacy.  Should you have any questions or concerns please call the internal medicine clinic at [phone].     Elza Rafter, D.O. [facility]

## 2021-04-16 NOTE — Progress Notes (Signed)
Internal Medicine Clinic Attending  I saw and evaluated the patient.  I personally confirmed the key portions of the history and exam documented by Dr.  Ned Card  and I reviewed pertinent patient test results.  The assessment, diagnosis, and plan were formulated together and I agree with the documentation in the resident's note.   Unsure of cause of weight gain.  Colchicine is not a classic cause.  She will keep track and call us for any other concerning changes.

## 2021-05-23 ENCOUNTER — Encounter: Payer: Self-pay | Admitting: Student

## 2021-05-23 ENCOUNTER — Ambulatory Visit: Payer: Medicaid Other | Admitting: Student

## 2021-05-23 ENCOUNTER — Other Ambulatory Visit: Payer: Self-pay

## 2021-05-23 ENCOUNTER — Other Ambulatory Visit (HOSPITAL_COMMUNITY)
Admission: RE | Admit: 2021-05-23 | Discharge: 2021-05-23 | Disposition: A | Discharge status: Home / Self Care | Payer: Medicaid Other | Source: Ambulatory Visit | Attending: Internal Medicine | Admitting: Internal Medicine

## 2021-05-23 VITALS — BP 151/82 | HR 80 | Temp 98.1°F | Ht 61.0 in | Wt 191.2 lb

## 2021-05-23 DIAGNOSIS — Z0000 Encounter for general adult medical examination without abnormal findings: Secondary | ICD-10-CM

## 2021-05-23 DIAGNOSIS — M797 Fibromyalgia: Secondary | ICD-10-CM | POA: Diagnosis not present

## 2021-05-23 DIAGNOSIS — Z124 Encounter for screening for malignant neoplasm of cervix: Secondary | ICD-10-CM | POA: Insufficient documentation

## 2021-05-23 DIAGNOSIS — I309 Acute pericarditis, unspecified: Secondary | ICD-10-CM | POA: Diagnosis not present

## 2021-05-23 DIAGNOSIS — Z720 Tobacco use: Secondary | ICD-10-CM | POA: Diagnosis not present

## 2021-05-23 DIAGNOSIS — W19XXXA Unspecified fall, initial encounter: Secondary | ICD-10-CM

## 2021-05-23 MED ORDER — IBUPROFEN 800 MG PO TABS: 800.0000 mg | ORAL_TABLET | Freq: Two times a day (BID) | ORAL | 2 refills | Status: DC | PRN

## 2021-05-23 NOTE — Patient Instructions (Addendum)
It was a pleasure seeing you in clinic. Today we discussed:   Pap smear: I will message you with the results for your pap smear through My chart  Fibromyalgia/Lupus: I have sent Ibuprofen 800 mg twice daily as needed for pain  Fall: You appear to be recovering well from your fall, please follow up as needed if pain does not improve for worsens  Tobacco: Please continue to work towards reducing tobacco usage, please follow up if you are interested in medications that can make quitting easier   Flu shot: So glad you are interested in getting your flu shot. -We will likely have vaccine starting in October, you can call the clinic to set up a visit for vaccination or go to your local pharmacy  If you have any questions or concerns, please call our clinic at [phone] between 9am-5pm and after hours call [phone] and ask for the internal medicine resident on call. If you feel you are having a medical emergency please call 911.   Thank you, we look forward to helping you remain healthy!

## 2021-05-23 NOTE — Progress Notes (Signed)
   CC: pap smear, fall  HPI:  [name] is a 40 y.o.  with history listed below presents today for clinic for pap smear. Also notes fall about 1 week ago. Please refer to problem based charting for further details and assessment and plan of current problem and chronic medical conditions.  Past Medical History:  Diagnosis Date   Bronchitis    Fibromyalgia    Lupus (HCC)    Pericarditis    per pt report   Pleuritis 12/31/2017   Review of Systems:  Negative as per HPI  Physical Exam:  Vitals:   05/23/21 1324  BP: (!) 151/82  Pulse: 80  Temp: 98.1 F (36.7 C)  TempSrc: Oral  Weight: 191 lb 3.2 oz (86.7 kg)  Height: 5\' 1"  (1.549 m)   Constitutional: Appears well-developed and well-nourished. No distress.  HENT: Normocephalic and atraumatic, EOMI, conjunctiva normal, moist mucous membranes Cardiovascular: Normal rate, regular rhythm, S1 and S2 present, no murmurs, rubs, gallops.  Distal pulses intact Respiratory: Effort is normal.  Lungs are clear to auscultation bilaterally. GI: Nondistended, soft, nontender to palpation, normal active bowel sounds Musculoskeletal: 3 cm healing contusion of the left hip, 2 cm healing contusion of the left shoulder with small healing abrasions of the left arm and forearm, no active bleeding erythema or warmth. Mild pain with ROM of the left shoulder . No peripheral edema noted. Neurological: Is alert and oriented x4, no apparent focal deficits noted. Skin: Warm and dry.  No rash, erythema, lesions noted. Genitourinary: Patient examined with chaperone present. Normal external genitalia, normal vaginal vault no lesions, erythema, or discharge, Cervical os closed.   Psychiatric: Normal mood and affect. Behavior is normal. Judgment and thought content normal.    Assessment & Plan:   See Encounters Tab for problem based charting.  Patient discussed with Dr. 

## 2021-05-24 DIAGNOSIS — W19XXXA Unspecified fall, initial encounter: Secondary | ICD-10-CM | POA: Insufficient documentation

## 2021-05-24 NOTE — Assessment & Plan Note (Signed)
Patient takes ibuprofen 600 mg three times daily for pain related to fibromyalgia and SLE. Notes she took 800 mg twice daily during flare of pericarditis about 2 months ago. Feels that higher dose of medication was more helpful for her pain and only needed 1 to 2 doses a day rather than three times a day to help with her pain. Denies any GI symptoms. Kidney function appears stable from recent labs.   Plan Ibuprofen 800 mg twice daily as needed

## 2021-05-24 NOTE — Progress Notes (Signed)
Internal Medicine Clinic Attending ? ?Case discussed with Dr. Liang  At the time of the visit.  We reviewed the resident?s history and exam and pertinent patient test results.  I agree with the assessment, diagnosis, and plan of care documented in the resident?s note. ? ?

## 2021-05-24 NOTE — Assessment & Plan Note (Addendum)
Pap smear performed today.  Patient would like flu vaccine, discussed [facility] will likely have vaccine available in October, she will return in later in fall or through her local pharmacy  Declined COVID vaccine today, patient states she would like to continue thinking about it

## 2021-05-24 NOTE — Assessment & Plan Note (Signed)
Patient reports fall 1 week ago. She woke up in the middle of the night and tripped over her sneaker. Has bruises on her left hip and shoulder with 2 small areas of healing abrasions of the LUE. Denies hitting her head, LOC, or symptoms preceding fall. Some shoulder soreness with ROM but has been moving and walking well at home. Feels that pain is slowly improving. Appears she is recovering well from the fall.

## 2021-05-24 NOTE — Assessment & Plan Note (Signed)
Patient reports chest pain has resolved. Does occasionally have some chest pressure with bending over but this is also gradually improving. Still taking colchicine which she feels is helping. Denies any shortness of breath today.   Plan Continue colchicine to finished 3 months Follow up as need if pain returns

## 2021-05-24 NOTE — Assessment & Plan Note (Signed)
Currently smokes about 1/2 pack a day, notes she is smoking more since recent death in the family and stress of her fibromyalgia and lupus. Has been trying to cut down on smoking. She is not interested in medical therapy at this time. Encouraged her to continue to cut down and will revisit cessation at follow up.

## 2021-05-25 LAB — CYTOLOGY - PAP
Comment: NEGATIVE
Diagnosis: NEGATIVE
High risk HPV: NEGATIVE

## 2021-06-06 ENCOUNTER — Other Ambulatory Visit: Payer: Self-pay | Admitting: Physician Assistant

## 2021-06-06 ENCOUNTER — Other Ambulatory Visit (HOSPITAL_COMMUNITY): Payer: Self-pay | Admitting: Physician Assistant

## 2021-06-06 DIAGNOSIS — M542 Cervicalgia: Secondary | ICD-10-CM

## 2021-06-06 DIAGNOSIS — M4802 Spinal stenosis, cervical region: Secondary | ICD-10-CM

## 2021-06-15 ENCOUNTER — Ambulatory Visit (HOSPITAL_COMMUNITY): Payer: Medicaid Other

## 2021-06-22 ENCOUNTER — Other Ambulatory Visit (HOSPITAL_COMMUNITY): Payer: Medicaid Other

## 2021-06-27 ENCOUNTER — Ambulatory Visit (HOSPITAL_COMMUNITY): Payer: Medicaid Other

## 2021-06-27 ENCOUNTER — Encounter (HOSPITAL_COMMUNITY): Payer: Self-pay

## 2021-07-01 ENCOUNTER — Ambulatory Visit (HOSPITAL_COMMUNITY)
Admission: RE | Admit: 2021-07-01 | Discharge: 2021-07-01 | Disposition: A | Discharge status: Home / Self Care | Payer: Medicaid Other | Source: Ambulatory Visit | Attending: Physician Assistant | Admitting: Physician Assistant

## 2021-07-01 DIAGNOSIS — M542 Cervicalgia: Secondary | ICD-10-CM | POA: Insufficient documentation

## 2021-07-01 DIAGNOSIS — M4802 Spinal stenosis, cervical region: Secondary | ICD-10-CM | POA: Insufficient documentation

## 2021-07-17 ENCOUNTER — Other Ambulatory Visit: Payer: Self-pay | Admitting: *Deleted

## 2021-07-17 ENCOUNTER — Other Ambulatory Visit: Payer: Self-pay | Admitting: Student

## 2021-07-17 NOTE — Telephone Encounter (Signed)
Tyler with [facility] called in requesting refill on colchicine.

## 2021-09-08 ENCOUNTER — Other Ambulatory Visit: Payer: Self-pay | Admitting: Student

## 2021-09-13 DIAGNOSIS — K118 Other diseases of salivary glands: Secondary | ICD-10-CM | POA: Insufficient documentation

## 2021-12-20 ENCOUNTER — Other Ambulatory Visit: Payer: Self-pay | Admitting: Student

## 2022-02-27 ENCOUNTER — Encounter: Payer: Self-pay | Admitting: Emergency Medicine

## 2022-02-27 ENCOUNTER — Ambulatory Visit
Admission: EM | Admit: 2022-02-27 | Discharge: 2022-02-27 | Disposition: A | Discharge status: Home / Self Care | Payer: Medicaid Other | Attending: Internal Medicine | Admitting: Internal Medicine

## 2022-02-27 DIAGNOSIS — J029 Acute pharyngitis, unspecified: Secondary | ICD-10-CM | POA: Diagnosis present

## 2022-02-27 DIAGNOSIS — Z20818 Contact with and (suspected) exposure to other bacterial communicable diseases: Secondary | ICD-10-CM | POA: Diagnosis present

## 2022-02-27 DIAGNOSIS — J069 Acute upper respiratory infection, unspecified: Secondary | ICD-10-CM | POA: Diagnosis present

## 2022-02-27 DIAGNOSIS — R051 Acute cough: Secondary | ICD-10-CM | POA: Diagnosis present

## 2022-02-27 LAB — POCT RAPID STREP A (OFFICE): Rapid Strep A Screen: NEGATIVE

## 2022-02-27 MED ORDER — AMOXICILLIN 500 MG PO CAPS: 500.0000 mg | ORAL_CAPSULE | Freq: Two times a day (BID) | ORAL | 0 refills | Status: AC

## 2022-02-27 NOTE — ED Provider Notes (Signed)
[facility]    CSN: [id number] Arrival date & time: 02/27/22  1449      History   Chief Complaint Chief Complaint  Patient presents with   Cough    HPI Diana Huynh is a 41 y.o. female.   Patient presents with cough, sore throat, nasal congestion, generalized body aches that have been present for a few days.  Patient reports that her daughter recently tested positive for strep throat today.  Denies any known fevers at home.  Denies chest pain, shortness of breath, ear pain, nausea, vomiting, diarrhea, abdominal pain.  Patient does not report taking medications for symptoms.   Cough  Past Medical History:  Diagnosis Date   Bronchitis    Fibromyalgia    Lupus (HCC)    Pericarditis    per pt report   Pleuritis 12/31/2017    Patient Active Problem List   Diagnosis Date Noted   Fall 05/24/2021   Pericarditis 04/05/2021   Respiratory infection 05/12/2020   Right-sided chest pain 01/20/2020   Hemoptysis 01/20/2020   GERD (gastroesophageal reflux disease) 02/12/2019   Pulmonary nodule less than 6 cm determined by computed tomography of lung 02/12/2019   Precordial chest pain 02/05/2019   Cough 01/06/2019   Healthcare maintenance 07/25/2018   Fibromyalgia 06/25/2018   Tobacco abuse 03/07/2018   Long-term use of Plaquenil 03/06/2018   Acute non-recurrent frontal sinusitis 02/10/2018   SLE (systemic lupus erythematosus) (HCC) 12/31/2017   Pericardial effusion     Past Surgical History:  Procedure Laterality Date   ANKLE SURGERY     APPENDECTOMY     CESAREAN SECTION      OB History   No obstetric history on file.      Home Medications    Prior to Admission medications   Medication Sig Start Date End Date Taking? Authorizing Provider  albuterol (VENTOLIN HFA) 108 (90 Base) MCG/ACT inhaler Inhale 1-2 puffs into the lungs every 6 (six) hours as needed for wheezing or shortness of breath. 05/07/20  Yes Yu, Amy V, PA-C  alum & mag hydroxide-simeth  (MAALOX/MYLANTA) 200-200-20 MG/5ML suspension Take 15 mLs by mouth every 6 (six) hours as needed for indigestion or heartburn.   Yes [provider]  amoxicillin (AMOXIL) 500 MG capsule Take 1 capsule (500 mg total) by mouth 2 (two) times daily for 10 days. 02/27/22 03/09/22 Yes Jaquita Bessire, Acie Fredrickson, FNP  cyclobenzaprine (FLEXERIL) 5 MG tablet Take by mouth. 12/08/21  Yes [provider]  diclofenac Sodium (VOLTAREN) 1 % GEL Apply 2 g topically as needed. 04/12/21  Yes Katsadouros, Vasilios, MD  famotidine (PEPCID) 20 MG tablet Take 20 mg by mouth daily.    Yes [provider]  hydroxychloroquine (PLAQUENIL) 200 MG tablet Take 200-400 mg by mouth daily. Take 200mg  every other day alternating with 400 mg   Yes [provider]  ibuprofen (ADVIL) 800 MG tablet TAKE ONE TABLET BY MOUTH TWICE A DAY AS NEEDED 12/21/21  Yes 12/23/21, MD  pregabalin (LYRICA) 150 MG capsule Take 150 mg by mouth 2 (two) times daily.   Yes [provider]  Baclofen 5 MG TABS Take by mouth in the morning and at bedtime.    [provider]  benzonatate (TESSALON) 200 MG capsule Take 1 capsule (200 mg total) by mouth every 8 (eight) hours. 09/26/20   09/28/20, MD  colchicine 0.6 MG tablet Take 1 tablet (0.6 mg total) by mouth 2 (two) times daily. 04/05/21 07/04/21  Katsadouros, Vasilios,  MD  traMADol (ULTRAM) 50 MG tablet Take 1-2 tablets (50-100 mg total) by mouth every 6 (six) hours as needed. 06/22/20   Kathryne Hitch, MD    Family History Family History  Problem Relation Age of Onset   Hypertension Mother    Heart murmur Mother    Healthy Father    Thyroid disease Sister    Breast cancer Paternal Grandmother    Breast cancer Paternal Aunt    Heart failure Sister        has ICD   Cervical cancer Sister     Social History Social History   Tobacco Use   Smoking status: Every Day    Packs/day: 0.50    Types: Cigarettes    Last attempt to quit:  09/20/2018    Years since quitting: 3.4   Smokeless tobacco: Never  Substance Use Topics   Alcohol use: Yes    Comment: Occasionally.   Drug use: No     Allergies   Patient has no known allergies.   Review of Systems Review of Systems Per HPI  Physical Exam Triage Vital Signs ED Triage Vitals  Enc Vitals Group     BP 02/27/22 1518 (!) 155/91     Pulse Rate 02/27/22 1518 (!) 109     Resp 02/27/22 1518 18     Temp 02/27/22 1518 98.1 F (36.7 C)     Temp Source 02/27/22 1518 Oral     SpO2 02/27/22 1518 98 %     Weight 02/27/22 1519 205 lb (93 kg)     Height 02/27/22 1519 5\' 1"  (1.549 m)     Head Circumference --      Peak Flow --      Pain Score 02/27/22 1519 8     Pain Loc --      Pain Edu? --      Excl. in GC? --    No data found.  Updated Vital Signs BP (!) 155/91 (BP Location: Right Arm)   Pulse (!) 109   Temp 98.1 F (36.7 C) (Oral)   Resp 18   Ht 5\' 1"  (1.549 m)   Wt 205 lb (93 kg)   LMP 02/21/2022   SpO2 98%   BMI 38.73 kg/m   Visual Acuity Right Eye Distance:   Left Eye Distance:   Bilateral Distance:    Right Eye Near:   Left Eye Near:    Bilateral Near:     Physical Exam Constitutional:      General: She is not in acute distress.    Appearance: Normal appearance. She is not toxic-appearing or diaphoretic.  HENT:     Head: Normocephalic and atraumatic.     Right Ear: Tympanic membrane and ear canal normal.     Left Ear: Tympanic membrane and ear canal normal.     Nose: Congestion present.     Mouth/Throat:     Mouth: Mucous membranes are moist.     Pharynx: Posterior oropharyngeal erythema present. No oropharyngeal exudate.     Tonsils: No tonsillar exudate or tonsillar abscesses.  Eyes:     Extraocular Movements: Extraocular movements intact.     Conjunctiva/sclera: Conjunctivae normal.     Pupils: Pupils are equal, round, and reactive to light.  Cardiovascular:     Rate and Rhythm: Normal rate and regular rhythm.     Pulses:  Normal pulses.     Heart sounds: Normal heart sounds.  Pulmonary:     Effort: Pulmonary effort is  normal. No respiratory distress.     Breath sounds: Normal breath sounds. No wheezing.  Abdominal:     General: Abdomen is flat. Bowel sounds are normal.     Palpations: Abdomen is soft.  Musculoskeletal:        General: Normal range of motion.     Cervical back: Normal range of motion.  Skin:    General: Skin is warm and dry.  Neurological:     General: No focal deficit present.     Mental Status: She is alert and oriented to person, place, and time. Mental status is at baseline.  Psychiatric:        Mood and Affect: Mood normal.        Behavior: Behavior normal.     UC Treatments / Results  Labs (all labs ordered are listed, but only abnormal results are displayed) Labs Reviewed  CULTURE, GROUP A STREP [facility]  POCT RAPID STREP A (OFFICE)    EKG   Radiology No results found.  Procedures Procedures (including critical care time)  Medications Ordered in UC Medications - No data to display  Initial Impression / Assessment and Plan / UC Course  I have reviewed the triage vital signs and the nursing notes.  Pertinent labs & imaging results that were available during my care of the patient were reviewed by me and considered in my medical decision making (see chart for details).     Rapid strep was negative but still suspicious of strep throat given appearance of posterior pharynx on exam and patient's close exposure.  Will opt to treat with amoxicillin antibiotic.  No signs of peritonsillar abscess on exam.  Discussed the possibility of viral upper respiratory infection being present as well.  Discussed supportive care and symptom management with patient.  Discussed return precautions.  Patient verbalized understanding and was agreeable with plan. Final Clinical Impressions(s) / UC Diagnoses   Final diagnoses:  Sore throat  Acute cough  Acute upper respiratory infection   Streptococcus exposure     Discharge Instructions      Your rapid strep was negative but I am still suspicious of strep throat so you are being treated with antibiotic.  Please follow-up if symptoms persist or worsen.    ED Prescriptions     Medication Sig Dispense Auth. Provider   amoxicillin (AMOXIL) 500 MG capsule Take 1 capsule (500 mg total) by mouth 2 (two) times daily for 10 days. 20 capsule Gustavus BryantMound, Nychelle Cassata E, [place]      PDMP not reviewed this encounter.   Gustavus BryantMound, Adamariz Gillott E, [place] 02/27/22 [phone]

## 2022-02-27 NOTE — ED Triage Notes (Signed)
Patient c/o cough and sore throat for several days.  Daughter recently diagnosed w/strep throat.

## 2022-02-27 NOTE — Discharge Instructions (Signed)
Your rapid strep was negative but I am still suspicious of strep throat so you are being treated with antibiotic.  Please follow-up if symptoms persist or worsen.

## 2022-03-02 LAB — CULTURE, GROUP A STREP (THRC)

## 2022-03-16 DIAGNOSIS — G894 Chronic pain syndrome: Secondary | ICD-10-CM | POA: Diagnosis not present

## 2022-03-16 DIAGNOSIS — M797 Fibromyalgia: Secondary | ICD-10-CM | POA: Diagnosis not present

## 2022-03-16 DIAGNOSIS — M542 Cervicalgia: Secondary | ICD-10-CM | POA: Diagnosis not present

## 2022-03-16 DIAGNOSIS — M4802 Spinal stenosis, cervical region: Secondary | ICD-10-CM | POA: Diagnosis not present

## 2022-04-09 ENCOUNTER — Other Ambulatory Visit: Payer: Self-pay | Admitting: Student

## 2022-04-25 DIAGNOSIS — G629 Polyneuropathy, unspecified: Secondary | ICD-10-CM | POA: Diagnosis not present

## 2022-04-25 DIAGNOSIS — M25511 Pain in right shoulder: Secondary | ICD-10-CM | POA: Diagnosis not present

## 2022-04-25 DIAGNOSIS — M79601 Pain in right arm: Secondary | ICD-10-CM | POA: Diagnosis not present

## 2022-04-25 DIAGNOSIS — Z8739 Personal history of other diseases of the musculoskeletal system and connective tissue: Secondary | ICD-10-CM | POA: Diagnosis not present

## 2022-04-25 DIAGNOSIS — R202 Paresthesia of skin: Secondary | ICD-10-CM | POA: Diagnosis not present

## 2022-04-25 DIAGNOSIS — R200 Anesthesia of skin: Secondary | ICD-10-CM | POA: Diagnosis not present

## 2022-05-28 DIAGNOSIS — M329 Systemic lupus erythematosus, unspecified: Secondary | ICD-10-CM | POA: Diagnosis not present

## 2022-05-28 DIAGNOSIS — M25441 Effusion, right hand: Secondary | ICD-10-CM | POA: Diagnosis not present

## 2022-05-28 DIAGNOSIS — M65831 Other synovitis and tenosynovitis, right forearm: Secondary | ICD-10-CM | POA: Diagnosis not present

## 2022-06-19 DIAGNOSIS — M797 Fibromyalgia: Secondary | ICD-10-CM | POA: Diagnosis not present

## 2022-06-19 DIAGNOSIS — M4802 Spinal stenosis, cervical region: Secondary | ICD-10-CM | POA: Diagnosis not present

## 2022-06-19 DIAGNOSIS — M542 Cervicalgia: Secondary | ICD-10-CM | POA: Diagnosis not present

## 2022-06-19 DIAGNOSIS — G894 Chronic pain syndrome: Secondary | ICD-10-CM | POA: Diagnosis not present

## 2022-06-21 DIAGNOSIS — M329 Systemic lupus erythematosus, unspecified: Secondary | ICD-10-CM | POA: Diagnosis not present

## 2022-06-21 DIAGNOSIS — I7300 Raynaud's syndrome without gangrene: Secondary | ICD-10-CM | POA: Diagnosis not present

## 2022-06-21 DIAGNOSIS — Z79899 Other long term (current) drug therapy: Secondary | ICD-10-CM | POA: Diagnosis not present

## 2022-06-21 DIAGNOSIS — M797 Fibromyalgia: Secondary | ICD-10-CM | POA: Diagnosis not present

## 2022-06-21 DIAGNOSIS — M1990 Unspecified osteoarthritis, unspecified site: Secondary | ICD-10-CM | POA: Diagnosis not present

## 2022-06-25 DIAGNOSIS — M329 Systemic lupus erythematosus, unspecified: Secondary | ICD-10-CM | POA: Diagnosis not present

## 2022-06-28 DIAGNOSIS — M329 Systemic lupus erythematosus, unspecified: Secondary | ICD-10-CM | POA: Diagnosis not present

## 2022-06-28 DIAGNOSIS — M25531 Pain in right wrist: Secondary | ICD-10-CM | POA: Diagnosis not present

## 2022-06-28 DIAGNOSIS — M25431 Effusion, right wrist: Secondary | ICD-10-CM | POA: Diagnosis not present

## 2022-06-28 DIAGNOSIS — M7989 Other specified soft tissue disorders: Secondary | ICD-10-CM | POA: Diagnosis not present

## 2022-07-09 DIAGNOSIS — M329 Systemic lupus erythematosus, unspecified: Secondary | ICD-10-CM | POA: Diagnosis not present

## 2022-07-09 DIAGNOSIS — K118 Other diseases of salivary glands: Secondary | ICD-10-CM | POA: Diagnosis not present

## 2022-08-02 DIAGNOSIS — M329 Systemic lupus erythematosus, unspecified: Secondary | ICD-10-CM | POA: Diagnosis not present

## 2022-08-02 DIAGNOSIS — M1990 Unspecified osteoarthritis, unspecified site: Secondary | ICD-10-CM | POA: Diagnosis not present

## 2022-08-02 DIAGNOSIS — I7300 Raynaud's syndrome without gangrene: Secondary | ICD-10-CM | POA: Diagnosis not present

## 2022-08-02 DIAGNOSIS — M797 Fibromyalgia: Secondary | ICD-10-CM | POA: Diagnosis not present

## 2022-08-02 DIAGNOSIS — Z79899 Other long term (current) drug therapy: Secondary | ICD-10-CM | POA: Diagnosis not present

## 2022-08-06 ENCOUNTER — Other Ambulatory Visit: Payer: Self-pay | Admitting: Student

## 2022-08-08 ENCOUNTER — Ambulatory Visit: Payer: Medicaid Other | Admitting: Student

## 2022-08-08 VITALS — BP 122/71 | HR 94 | Temp 97.9°F | Ht 61.0 in | Wt 230.5 lb

## 2022-08-08 DIAGNOSIS — R051 Acute cough: Secondary | ICD-10-CM | POA: Diagnosis not present

## 2022-08-08 DIAGNOSIS — M797 Fibromyalgia: Secondary | ICD-10-CM | POA: Diagnosis not present

## 2022-08-08 DIAGNOSIS — Z0000 Encounter for general adult medical examination without abnormal findings: Secondary | ICD-10-CM

## 2022-08-08 MED ORDER — BENZONATATE 100 MG PO CAPS: 100.0000 mg | ORAL_CAPSULE | Freq: Three times a day (TID) | ORAL | 0 refills | Status: AC | PRN

## 2022-08-08 MED ORDER — BENZONATATE 100 MG PO CAPS: 100.0000 mg | ORAL_CAPSULE | Freq: Three times a day (TID) | ORAL | 1 refills | Status: DC | PRN

## 2022-08-08 MED ORDER — IBUPROFEN 800 MG PO TABS: 800.0000 mg | ORAL_TABLET | Freq: Two times a day (BID) | ORAL | 2 refills | Status: DC | PRN

## 2022-08-08 NOTE — Patient Instructions (Addendum)
Cough You likely have a viral infection I have sent in for tesselon perles  Please follow up if you are feeling worse develop fever, chills, severe SOB, or inability to tolerate food/water  Fibromyalgia  Follow up with you pain doctor I will refill your ibuprofen   Follow up in 4 week for flu shot, you can also get this done a local pharmacy

## 2022-08-14 NOTE — Assessment & Plan Note (Signed)
Reports cough for 1 day. Has been taking care of grandchild at home for the past few days who has had similar symptoms. Also notes congestion. Denies fevers or shortness of breath. Vitals are stable and lungs are clear to auscultation. Declined nasal swab for flu or COVID. Discussed given recent onset of symptom she may benefit from medical therapy. She understands and feels she will get over her symptoms soon. Requesting tessalon Perles for cough which I have sent. Return precautions reviewed.

## 2022-08-14 NOTE — Assessment & Plan Note (Signed)
Is seeing pain specialist with [facility] plan for getting spinal injections in the next month or so. Refilled ibuprofen today.

## 2022-08-15 ENCOUNTER — Encounter: Payer: Self-pay | Admitting: Student

## 2022-08-15 NOTE — Progress Notes (Signed)
Established Patient Office Visit  Subjective   Patient ID: Diana Huynh, female    DOB: 10-Oct-1980  Age: 41 y.o. MRN: [id number]  Chief Complaint  Patient presents with   Cough    X 2 days-requests rx for tessalon perles (Benzonatate)    Medication Refill    Ibuprofen 800mg      Diana Huynh presents today with husband for evaluation of cough. Please refer to problem based charting for further details and assessment and plan of current problem and chronic medical conditions.    Patient Active Problem List   Diagnosis Date Noted   Fall 05/24/2021   Pericarditis 04/05/2021   Respiratory infection 05/12/2020   Right-sided chest pain 01/20/2020   Hemoptysis 01/20/2020   GERD (gastroesophageal reflux disease) 02/12/2019   Pulmonary nodule less than 6 cm determined by computed tomography of lung 02/12/2019   Cough 01/06/2019   Healthcare maintenance 07/25/2018   Fibromyalgia 06/25/2018   Tobacco abuse 03/07/2018   Long-term use of Plaquenil 03/06/2018   Acute non-recurrent frontal sinusitis 02/10/2018   SLE (systemic lupus erythematosus) (HCC) 12/31/2017   Pericardial effusion       ROS: Negative as per HPI    Objective:     BP 122/71 (BP Location: Right Arm, Patient Position: Sitting, Cuff Size: Normal)   Pulse 94   Temp 97.9 F (36.6 C) (Oral)   Ht 5\' 1"  (1.549 m)   Wt 230 lb 8 oz (104.6 kg)   SpO2 100%   BMI 43.55 kg/m  BP Readings from Last 3 Encounters:  08/08/22 122/71  02/27/22 (!) 155/91  05/23/21 (!) 151/82      Physical Exam Constitutional:      Appearance: She is obese.  HENT:     Mouth/Throat:     Mouth: Mucous membranes are moist.     Pharynx: Oropharynx is clear. No oropharyngeal exudate or posterior oropharyngeal erythema.  Eyes:     Extraocular Movements: Extraocular movements intact.     Pupils: Pupils are equal, round, and reactive to light.  Cardiovascular:     Rate and Rhythm: Normal rate and regular rhythm.  Pulmonary:      Effort: Pulmonary effort is normal. No respiratory distress.     Breath sounds: Normal breath sounds. No rhonchi or rales.  Abdominal:     General: Abdomen is flat. Bowel sounds are normal. There is no distension.     Palpations: Abdomen is soft.     Tenderness: There is no abdominal tenderness.  Musculoskeletal:        General: Normal range of motion.     Cervical back: Normal range of motion. Tenderness (diffusely over SCM) present.     Right lower leg: No edema.     Left lower leg: No edema.  Lymphadenopathy:     Cervical: No cervical adenopathy.  Skin:    General: Skin is warm and dry.     Capillary Refill: Capillary refill takes less than 2 seconds.  Neurological:     General: No focal deficit present.     Mental Status: She is alert and oriented to person, place, and time.  Psychiatric:        Mood and Affect: Mood normal.        Behavior: Behavior normal.         The ASCVD Risk score (Arnett DK, et al., 2019) failed to calculate for the following reasons:   Cannot find a previous HDL lab   Cannot find a previous total cholesterol lab  Assessment & Plan:   Problem List Items Addressed This Visit       Other   Fibromyalgia - Primary    Is seeing pain specialist with [facility] plan for getting spinal injections in the next month or so. Refilled ibuprofen today.       Relevant Medications   DULoxetine (CYMBALTA) 60 MG capsule   ibuprofen (ADVIL) 800 MG tablet   Cough    Reports cough for 1 day. Has been taking care of grandchild at home for the past few days who has had similar symptoms. Also notes congestion. Denies fevers or shortness of breath. Vitals are stable and lungs are clear to auscultation. Declined nasal swab for flu or COVID. Discussed given recent onset of symptom she may benefit from medical therapy. She understands and feels she will get over her symptoms soon. Requesting tessalon Perles for cough which I have sent. Return precautions reviewed.         Return in about 4 weeks (around 09/05/2022), or if symptoms worsen or fail to improve.    Quincy Simmonds, MD

## 2022-08-15 NOTE — Assessment & Plan Note (Signed)
Deferred flu vaccine until she is feeling better

## 2022-08-20 NOTE — Progress Notes (Signed)
Internal Medicine Clinic Attending ? ?Case discussed with Dr. Liang  At the time of the visit.  We reviewed the resident?s history and exam and pertinent patient test results.  I agree with the assessment, diagnosis, and plan of care documented in the resident?s note. ? ?

## 2022-09-06 ENCOUNTER — Encounter: Payer: Self-pay | Admitting: Student

## 2022-09-06 ENCOUNTER — Ambulatory Visit: Payer: Medicaid Other | Admitting: Student

## 2022-09-06 ENCOUNTER — Other Ambulatory Visit: Payer: Self-pay

## 2022-09-06 VITALS — BP 132/61 | HR 105 | Temp 98.0°F | Ht 61.0 in | Wt 233.5 lb

## 2022-09-06 DIAGNOSIS — Z3201 Encounter for pregnancy test, result positive: Secondary | ICD-10-CM | POA: Diagnosis not present

## 2022-09-06 DIAGNOSIS — R109 Unspecified abdominal pain: Secondary | ICD-10-CM | POA: Diagnosis not present

## 2022-09-06 DIAGNOSIS — R110 Nausea: Secondary | ICD-10-CM | POA: Diagnosis not present

## 2022-09-06 DIAGNOSIS — Z3200 Encounter for pregnancy test, result unknown: Secondary | ICD-10-CM

## 2022-09-06 LAB — POCT URINE PREGNANCY: Preg Test, Ur: POSITIVE — AB

## 2022-09-06 NOTE — Patient Instructions (Addendum)
You urine pregnancy result was positive today.  [facility] discussed [facility] for [facility] at [phone]  Please let up know if would like to start birth control in the future to prevent this   If you choose not to terminate the pregnancy you may need to change some of your medications. We can discuss this further

## 2022-09-07 DIAGNOSIS — Z3009 Encounter for other general counseling and advice on contraception: Secondary | ICD-10-CM | POA: Insufficient documentation

## 2022-09-07 DIAGNOSIS — Z3201 Encounter for pregnancy test, result positive: Secondary | ICD-10-CM | POA: Insufficient documentation

## 2022-09-07 NOTE — Assessment & Plan Note (Signed)
Patient presents for abdominal pain and nausea for the past 2 weeks. Reports diffuse pain with suprapubic pain. States last LMP was 07/10/2022. She has regular menstrual cycles normally, but did not have a period last month. Is sexually active with her husband and does not use contraception. No vaginal bleeding of discharge currently. Urine pregnancy test positive in the clinic. Discussed results with her today. She does not desire pregnancy at this time. Estimated gestational age is 8 weeks by LMP. Gave her information [facility] to [facility] of her pregnancy. She understands and will contact them. Discussed if she does choose to continue with pregnancy she may need to make some medication changes and to contact the clinic to discuss this. Also discussed contraception in the future to prevent unwanted pregnancies in the future which she will think about.

## 2022-09-07 NOTE — Progress Notes (Signed)
Internal Medicine Clinic Attending ? ?Case discussed with Dr. Liang  At the time of the visit.  We reviewed the resident?s history and exam and pertinent patient test results.  I agree with the assessment, diagnosis, and plan of care documented in the resident?s note. ? ?

## 2022-09-07 NOTE — Progress Notes (Signed)
Established Patient Office Visit  Subjective   Patient ID: Diana Huynh, female    DOB: 1980/12/15  Age: 42 y.o. MRN: [id number]  Chief Complaint  Patient presents with   Abdominal Pain    All 4 quads x 1 week    Diana Huynh is a 41 y.o. person living with a history listed below who presents to clinic due to missing her menstrual cycle. Is concerned she may be pregnant. She has not tested herself at home. Please refer to problem based charting for further details and assessment and plan of current problem and chronic medical conditions.     Patient Active Problem List   Diagnosis Date Noted   Pregnancy test performed, pregnancy confirmed 09/07/2022   Fall 05/24/2021   Pericarditis 04/05/2021   Respiratory infection 05/12/2020   Right-sided chest pain 01/20/2020   Hemoptysis 01/20/2020   GERD (gastroesophageal reflux disease) 02/12/2019   Pulmonary nodule less than 6 cm determined by computed tomography of lung 02/12/2019   Cough 01/06/2019   Healthcare maintenance 07/25/2018   Fibromyalgia 06/25/2018   Tobacco abuse 03/07/2018   Long-term use of Plaquenil 03/06/2018   Acute non-recurrent frontal sinusitis 02/10/2018   SLE (systemic lupus erythematosus) (HCC) 12/31/2017   Pericardial effusion        Review of Systems  Constitutional:  Negative for chills and fever.  Respiratory:  Negative for sputum production.   Cardiovascular:  Negative for chest pain and palpitations.  Gastrointestinal:  Positive for abdominal pain, nausea and vomiting. Negative for diarrhea.  Genitourinary:  Negative for frequency and urgency.  Neurological:  Positive for headaches. Negative for speech change and focal weakness.        Objective:     BP 132/61 (BP Location: Right Arm, Patient Position: Sitting, Cuff Size: Large)   Pulse (!) 105   Temp 98 F (36.7 C) (Oral)   Ht 5\' 1"  (1.549 m)   Wt 233 lb 8 oz (105.9 kg)   LMP 07/10/2022 (Exact Date)   SpO2 100%   BMI 44.12 kg/m  BP  Readings from Last 3 Encounters:  09/06/22 132/61  08/08/22 122/71  02/27/22 (!) 155/91      Physical Exam Constitutional:      Appearance: Normal appearance.     Comments: Anxious appearing  HENT:     Mouth/Throat:     Mouth: Mucous membranes are moist.     Pharynx: Oropharynx is clear.  Cardiovascular:     Rate and Rhythm: Normal rate and regular rhythm.  Pulmonary:     Effort: Pulmonary effort is normal.     Breath sounds: No rhonchi or rales.  Abdominal:     General: Abdomen is flat. Bowel sounds are normal. There is no distension.     Palpations: Abdomen is soft.     Tenderness: There is generalized abdominal tenderness. There is no guarding.  Musculoskeletal:        General: Normal range of motion.     Right lower leg: No edema.     Left lower leg: No edema.  Skin:    General: Skin is warm and dry.     Capillary Refill: Capillary refill takes less than 2 seconds.  Neurological:     General: No focal deficit present.     Mental Status: She is alert and oriented to person, place, and time.  Psychiatric:        Mood and Affect: Mood normal.        Behavior: Behavior normal.  Results for orders placed or performed in visit on 09/06/22  POCT Urine Pregnancy  Result Value Ref Range   Preg Test, Ur Positive (A) Negative       The ASCVD Risk score (Arnett DK, et al., 2019) failed to calculate for the following reasons:   Cannot find a previous HDL lab   Cannot find a previous total cholesterol lab    Assessment & Plan:   Problem List Items Addressed This Visit       Other   Pregnancy test performed, pregnancy confirmed    Patient presents for abdominal pain and nausea for the past 2 weeks. Reports diffuse pain with suprapubic pain. States last LMP was 07/10/2022. She has regular menstrual cycles normally, but did not have a period last month. Is sexually active with her husband and does not use contraception. No vaginal bleeding of discharge currently.  Urine pregnancy test positive in the clinic. Discussed results with her today. She does not desire pregnancy at this time. Estimated gestational age is 8 weeks by LMP. Gave [facility] to [facility] her pregnancy. She understands and will contact them. Discussed if she does choose to continue with pregnancy she may need to make some medication changes and to contact the clinic to discuss this. Also discussed contraception in the future to prevent unwanted pregnancies in the future which she will think about.       Other Visit Diagnoses     Encounter for pregnancy test, result unknown    -  Primary   Relevant Orders   POCT Urine Pregnancy (Completed)   AMB Referral to Community Care Coordinaton ([facility] Patients)       No follow-ups on file.    Quincy Simmonds, MD

## 2022-12-09 ENCOUNTER — Other Ambulatory Visit: Payer: Self-pay | Admitting: Student

## 2023-01-16 ENCOUNTER — Other Ambulatory Visit: Payer: Self-pay

## 2023-01-16 ENCOUNTER — Encounter (HOSPITAL_COMMUNITY): Payer: Self-pay

## 2023-01-16 ENCOUNTER — Emergency Department (HOSPITAL_COMMUNITY): Payer: Medicaid Other

## 2023-01-16 ENCOUNTER — Emergency Department (HOSPITAL_COMMUNITY)
Admission: EM | Admit: 2023-01-16 | Discharge: 2023-01-17 | Disposition: A | Discharge status: Home / Self Care | Payer: Medicaid Other | Attending: Emergency Medicine | Admitting: Emergency Medicine

## 2023-01-16 DIAGNOSIS — Z79899 Other long term (current) drug therapy: Secondary | ICD-10-CM | POA: Insufficient documentation

## 2023-01-16 DIAGNOSIS — R079 Chest pain, unspecified: Secondary | ICD-10-CM | POA: Diagnosis not present

## 2023-01-16 DIAGNOSIS — R2232 Localized swelling, mass and lump, left upper limb: Secondary | ICD-10-CM | POA: Diagnosis present

## 2023-01-16 DIAGNOSIS — M546 Pain in thoracic spine: Secondary | ICD-10-CM | POA: Insufficient documentation

## 2023-01-16 DIAGNOSIS — M797 Fibromyalgia: Secondary | ICD-10-CM | POA: Insufficient documentation

## 2023-01-16 DIAGNOSIS — L03114 Cellulitis of left upper limb: Secondary | ICD-10-CM | POA: Insufficient documentation

## 2023-01-16 DIAGNOSIS — L932 Other local lupus erythematosus: Secondary | ICD-10-CM | POA: Insufficient documentation

## 2023-01-16 DIAGNOSIS — R0789 Other chest pain: Secondary | ICD-10-CM | POA: Diagnosis not present

## 2023-01-16 LAB — CBC WITH DIFFERENTIAL/PLATELET
Abs Immature Granulocytes: 0.02 10*3/uL (ref 0.00–0.07)
Basophils Absolute: 0 10*3/uL (ref 0.0–0.1)
Basophils Relative: 0 %
Eosinophils Absolute: 0.1 10*3/uL (ref 0.0–0.5)
Eosinophils Relative: 3 %
HCT: 35.2 % — ABNORMAL LOW (ref 36.0–46.0)
Hemoglobin: 11.5 g/dL — ABNORMAL LOW (ref 12.0–15.0)
Immature Granulocytes: 0 %
Lymphocytes Relative: 27 %
Lymphs Abs: 1.2 10*3/uL (ref 0.7–4.0)
MCH: 27.1 pg (ref 26.0–34.0)
MCHC: 32.7 g/dL (ref 30.0–36.0)
MCV: 83 fL (ref 80.0–100.0)
Monocytes Absolute: 0.4 10*3/uL (ref 0.1–1.0)
Monocytes Relative: 9 %
Neutro Abs: 2.7 10*3/uL (ref 1.7–7.7)
Neutrophils Relative %: 61 %
Platelets: 268 10*3/uL (ref 150–400)
RBC: 4.24 MIL/uL (ref 3.87–5.11)
RDW: 15.5 % (ref 11.5–15.5)
WBC: 4.5 10*3/uL (ref 4.0–10.5)
nRBC: 0 % (ref 0.0–0.2)

## 2023-01-16 LAB — COMPREHENSIVE METABOLIC PANEL
ALT: 21 U/L (ref 0–44)
AST: 18 U/L (ref 15–41)
Albumin: 4.2 g/dL (ref 3.5–5.0)
Alkaline Phosphatase: 43 U/L (ref 38–126)
Anion gap: 8 (ref 5–15)
BUN: 11 mg/dL (ref 6–20)
CO2: 25 mmol/L (ref 22–32)
Calcium: 9 mg/dL (ref 8.9–10.3)
Chloride: 103 mmol/L (ref 98–111)
Creatinine, Ser: 0.94 mg/dL (ref 0.44–1.00)
GFR, Estimated: >60 mL/min (ref >60)
Glucose, Bld: 105 mg/dL — ABNORMAL HIGH (ref 70–99)
Potassium: 3.3 mmol/L — ABNORMAL LOW (ref 3.5–5.1)
Sodium: 136 mmol/L (ref 135–145)
Total Bilirubin: 0.5 mg/dL (ref 0.3–1.2)
Total Protein: 8.2 g/dL — ABNORMAL HIGH (ref 6.5–8.1)

## 2023-01-16 LAB — TROPONIN I (HIGH SENSITIVITY)
Troponin I (High Sensitivity): <2 ng/L (ref <18)
Troponin I (High Sensitivity): <2 ng/L (ref <18)

## 2023-01-16 MED ORDER — PREDNISONE 20 MG PO TABS: 40.0000 mg | ORAL_TABLET | Freq: Every day | ORAL | 0 refills | Status: DC

## 2023-01-16 MED ORDER — HYDROCODONE-ACETAMINOPHEN 5-325 MG PO TABS: 1.0000 | ORAL_TABLET | Freq: Four times a day (QID) | ORAL | 0 refills | Status: DC | PRN

## 2023-01-16 MED ORDER — LIDOCAINE 5 % EX OINT: 1.0000 | TOPICAL_OINTMENT | CUTANEOUS | 0 refills | Status: DC | PRN

## 2023-01-16 MED ORDER — MORPHINE SULFATE (PF) 4 MG/ML IV SOLN
4.0000 mg | Freq: Once | INTRAVENOUS | Status: DC
  Filled 2023-01-16: qty 1

## 2023-01-16 MED ORDER — PREDNISONE 20 MG PO TABS
60.0000 mg | ORAL_TABLET | ORAL | Status: AC
  Administered 2023-01-16: 60 mg via ORAL
  Filled 2023-01-16: qty 3

## 2023-01-16 MED ORDER — CEPHALEXIN 500 MG PO CAPS
500.0000 mg | ORAL_CAPSULE | Freq: Once | ORAL | Status: AC
  Administered 2023-01-16: 500 mg via ORAL
  Filled 2023-01-16: qty 1

## 2023-01-16 MED ORDER — CEPHALEXIN 500 MG PO CAPS: 500.0000 mg | ORAL_CAPSULE | Freq: Two times a day (BID) | ORAL | 0 refills | Status: AC

## 2023-01-16 MED ORDER — MORPHINE SULFATE (PF) 4 MG/ML IV SOLN
4.0000 mg | Freq: Once | INTRAVENOUS | Status: AC
  Administered 2023-01-16: 4 mg via INTRAVENOUS

## 2023-01-16 NOTE — Discharge Instructions (Addendum)
As discussed, you have been diagnosed with cellulitis.  Your other tests are reassuring, and there is currently no evidence for a systemwide infection.  Please take all medication as prescribed and discuss your evaluation tonight with your rheumatologist tomorrow.  Also discussed your medications and make sure that you are on appropriate regimen.  Return here for concerning changes in your condition.

## 2023-01-16 NOTE — ED Provider Notes (Signed)
[facility] AT [facility] Provider Note   CSN: [id number] Arrival date & time: 01/16/23  1733     History  Chief Complaint  Patient presents with   Arm Swelling   Lupus   Chest Pain    Diana Huynh is a 42 y.o. female.  HPI Adult female presents with her husband who assists with the history.  Patient has multiple medical problems including both lupus and fibromyalgia.  She presents with right-sided thoracic discomfort, no dyspnea, no fever.  She also presents with concern of left arm erythema.  This second concern began yesterday, no obvious precipitant, no distal loss of sensation in her hand, no clear alleviating or exacerbating factors.  There is severe pain around these areas.     Home Medications Prior to Admission medications   Medication Sig Start Date End Date Taking? Authorizing Provider  albuterol (VENTOLIN HFA) 108 (90 Base) MCG/ACT inhaler Inhale 1-2 puffs into the lungs every 6 (six) hours as needed for wheezing or shortness of breath. 05/07/20  Yes Yu, Amy V, PA-C  amLODipine (NORVASC) 5 MG tablet Take 5 mg by mouth daily. 08/02/22  Yes [provider]  cephALEXin (KEFLEX) 500 MG capsule Take 1 capsule (500 mg total) by mouth 2 (two) times daily for 5 days. 01/16/23 01/21/23 Yes Gerhard Munch, MD  cyclobenzaprine (FLEXERIL) 10 MG tablet Take 10 mg by mouth in the morning and at bedtime.   Yes [provider]  diclofenac Sodium (VOLTAREN) 1 % GEL Apply 2 g topically as needed. Patient taking differently: Apply 2 g topically daily as needed (Shoulder pain). 04/12/21  Yes Katsadouros, Vasilios, MD  DULoxetine (CYMBALTA) 60 MG capsule Take 60 mg by mouth every evening.   Yes [provider]  famotidine (PEPCID) 20 MG tablet Take 20 mg by mouth daily.    Yes [provider]  HYDROcodone-acetaminophen (NORCO/VICODIN) 5-325 MG tablet Take 1 tablet by mouth every 6 (six) hours as needed. 01/16/23  Yes Gerhard Munch, MD  hydroxychloroquine (PLAQUENIL) 200 MG tablet Take 400 mg by mouth daily.   Yes [provider]  ibuprofen (ADVIL) 800 MG tablet TAKE 1 TABLET BY MOUTH TWICE A DAY AS NEEDED Patient taking differently: Take 800 mg by mouth 2 (two) times daily as needed for mild pain. 12/11/22  Yes Quincy Simmonds, MD  lidocaine (XYLOCAINE) 5 % ointment Apply 1 Application topically as needed. 01/16/23  Yes Gerhard Munch, MD  predniSONE (DELTASONE) 20 MG tablet Take 2 tablets (40 mg total) by mouth daily with breakfast. For the next four days 01/16/23  Yes Gerhard Munch, MD  pregabalin (LYRICA) 150 MG capsule Take 150 mg by mouth 2 (two) times daily.   Yes [provider]  colchicine 0.6 MG tablet Take 1 tablet (0.6 mg total) by mouth 2 (two) times daily. Patient not taking: Reported on 01/16/2023 04/05/21 07/04/21  Belva Agee, MD  cyclobenzaprine (FLEXERIL) 5 MG tablet Take by mouth. Patient not taking: Reported on 01/16/2023 12/08/21   [provider]      Allergies    Patient has no known allergies.    Review of Systems   Review of Systems  All other systems reviewed and are negative.   Physical Exam Updated Vital Signs BP 133/77   Pulse 84   Temp 98.5 F (36.9 C) (Oral)   Resp (!) 21   Ht  (1.549 m)   Wt 104.3 kg   LMP 12/20/2022   SpO2 100%  BMI 43.46 kg/m  Physical Exam Vitals and nursing note reviewed.  Constitutional:      General: She is not in acute distress.    Appearance: She is well-developed.  HENT:     Head: Normocephalic and atraumatic.  Eyes:     Conjunctiva/sclera: Conjunctivae normal.  Cardiovascular:     Rate and Rhythm: Normal rate and regular rhythm.  Pulmonary:     Effort: Pulmonary effort is normal. No respiratory distress.     Breath sounds: Normal breath sounds. No stridor.  Abdominal:     General: There is no distension.  Musculoskeletal:     Comments: Left elbow, shoulder, wrist unremarkable aside from  cutaneous changes as above.  Skin:    General: Skin is warm and dry.     Comments: To nearly identically sized oval patches on the patient's mid left arm.  Both lesions are raised, erythematous, warm to the touch.  Neurological:     Mental Status: She is alert and oriented to person, place, and time.     Cranial Nerves: No cranial nerve deficit.  Psychiatric:        Mood and Affect: Mood normal.     ED Results / Procedures / Treatments   Labs (all labs ordered are listed, but only abnormal results are displayed) Labs Reviewed  CBC WITH DIFFERENTIAL/PLATELET - Abnormal; Notable for the following components:      Result Value   Hemoglobin 11.5 (*)    HCT 35.2 (*)    All other components within normal limits  COMPREHENSIVE METABOLIC PANEL - Abnormal; Notable for the following components:   Potassium 3.3 (*)    Glucose, Bld 105 (*)    Total Protein 8.2 (*)    All other components within normal limits  TROPONIN I (HIGH SENSITIVITY)  TROPONIN I (HIGH SENSITIVITY)    EKG EKG Interpretation  Date/Time:  Wednesday January 16 2023 17:44:04 EDT Ventricular Rate:  130 PR Interval:  173 QRS Duration: 68 QT Interval:  308 QTC Calculation: 453 R Axis:   80 Text Interpretation: Sinus tachycardia Ventricular premature complex Aberrant complex LAE, consider biatrial enlargement Borderline T abnormalities, diffuse leads Confirmed by Gerhard Munch [id number]) on 01/16/2023 9:01:55 PM  Radiology DG Chest 2 View  Result Date: 01/16/2023 CLINICAL DATA:  R CP EXAM: CHEST - 2 VIEW COMPARISON:  Chest x-ray 03/30/2021 FINDINGS: The heart and mediastinal contours are within normal limits. No focal consolidation. No pulmonary edema. No pleural effusion. No pneumothorax. No acute osseous abnormality. IMPRESSION: No active cardiopulmonary disease. Electronically Signed   By: Tish Frederickson M.D.   On: 01/16/2023 21:25    Procedures Procedures    Medications Ordered in ED Medications  cephALEXin  (KEFLEX) capsule 500 mg (500 mg Oral Given 01/16/23 2140)  predniSONE (DELTASONE) tablet 60 mg (60 mg Oral Given 01/16/23 2140)  morphine (PF) 4 MG/ML injection 4 mg (4 mg Intravenous Given 01/16/23 2215)    ED Course/ Medical Decision Making/ A&P                             Medical Decision Making Adult female with fibromyalgia, lupus presents with clinical signs and symptoms concerning for cellulitis, as well as with right-sided thoracic pain.  No dyspnea, no hypoxia, no pneumonia is a consideration, patient had labs, monitoring, antibiotics, analgesics started after arrival.  Cardiac 90 sinus normal Pulse ox 100% room air normal   Amount and/or Complexity of Data Reviewed Independent Historian:  spouse External Data Reviewed: notes. Labs: ordered. Decision-making details documented in ED Course. Radiology: ordered and independent interpretation performed. Decision-making details documented in ED Course. ECG/medicine tests: ordered and independent interpretation performed. Decision-making details documented in ED Course.  Risk Prescription drug management. Decision regarding hospitalization.   11:17 PM On repeat exam the patient is awake, alert, speaking clearly.  I have discussed all findings with her and her husband.  2 normal troponin, nonischemic EKG, little evidence for ACS. No evidence for PE with no hypoxia, tachycardia, tachypnea. No evidence for bacteremia or sepsis. Patient does have evidence for cellulitis, started on appropriate therapy here, will follow-up with her rheumatologist tomorrow.        Final Clinical Impression(s) / ED Diagnoses Final diagnoses:  Cellulitis of left upper extremity    Rx / DC Orders ED Discharge Orders          Ordered    predniSONE (DELTASONE) 20 MG tablet  Daily with breakfast        01/16/23 2314    cephALEXin (KEFLEX) 500 MG capsule  2 times daily        01/16/23 2314    lidocaine (XYLOCAINE) 5 % ointment  As needed         01/16/23 2314    HYDROcodone-acetaminophen (NORCO/VICODIN) 5-325 MG tablet  Every 6 hours PRN        01/16/23 2314              Gerhard Munch, MD 01/16/23 2317

## 2023-01-16 NOTE — ED Triage Notes (Signed)
Pt states she has lupus, started having left arm swelling and redness yesterday. C/o collar bone swelling. Has rash to forehead area. C/o intermittent right sided chest pain that radiates into upper back. Pt states the swelling and rash are not typical for her lupus flare ups.

## 2023-01-16 NOTE — ED Provider Triage Note (Signed)
Emergency Medicine Provider Triage Evaluation Note  Diana Huynh , a 42 y.o. female  was evaluated in triage.  Pt complains of left arm swelling and redness x several days.  Does have an obvious rash to the left elbow surrounding.  Also endorsing pain along the substernal aspect of her chest and right arm radiating onto the right chest.  Feels like she is having a lupus flareup with a rash.  Review of Systems  Positive: Chest pain, rash Negative: fever  Physical Exam  BP (!) 153/95   Pulse (!) 104   Temp 98.5 F (36.9 C) (Oral)   Resp 15   Ht  (1.549 m)   Wt 104.3 kg   SpO2 100%   BMI 43.46 kg/m  Gen:   Awake, no distress   Resp:  Normal effort  MSK:   Moves extremities without difficulty  Other:  Stable erythema to the left elbow with good range of motion of the left elbow.  Medical Decision Making  Medically screening exam initiated at 6:14 PM.  Appropriate orders placed.  Diana Huynh was informed that the remainder of the evaluation will be completed by another provider, this initial triage assessment does not replace that evaluation, and the importance of remaining in the ED until their evaluation is complete.     Claude Manges, PA-C 01/16/23 1610

## 2023-02-14 DIAGNOSIS — M542 Cervicalgia: Secondary | ICD-10-CM | POA: Diagnosis not present

## 2023-02-14 DIAGNOSIS — M797 Fibromyalgia: Secondary | ICD-10-CM | POA: Diagnosis not present

## 2023-02-14 DIAGNOSIS — M4802 Spinal stenosis, cervical region: Secondary | ICD-10-CM | POA: Diagnosis not present

## 2023-02-14 DIAGNOSIS — G894 Chronic pain syndrome: Secondary | ICD-10-CM | POA: Diagnosis not present

## 2023-02-19 ENCOUNTER — Ambulatory Visit: Payer: Medicaid Other | Admitting: Student

## 2023-02-19 VITALS — BP 134/84 | HR 105 | Temp 98.4°F | Ht 61.0 in | Wt 233.3 lb

## 2023-02-19 DIAGNOSIS — Z3201 Encounter for pregnancy test, result positive: Secondary | ICD-10-CM

## 2023-02-19 DIAGNOSIS — Z1231 Encounter for screening mammogram for malignant neoplasm of breast: Secondary | ICD-10-CM

## 2023-02-19 DIAGNOSIS — R61 Generalized hyperhidrosis: Secondary | ICD-10-CM

## 2023-02-19 DIAGNOSIS — L739 Follicular disorder, unspecified: Secondary | ICD-10-CM | POA: Diagnosis not present

## 2023-02-19 DIAGNOSIS — M329 Systemic lupus erythematosus, unspecified: Secondary | ICD-10-CM

## 2023-02-19 DIAGNOSIS — Z3009 Encounter for other general counseling and advice on contraception: Secondary | ICD-10-CM | POA: Diagnosis not present

## 2023-02-19 DIAGNOSIS — Z79899 Other long term (current) drug therapy: Secondary | ICD-10-CM

## 2023-02-19 DIAGNOSIS — M3219 Other organ or system involvement in systemic lupus erythematosus: Secondary | ICD-10-CM

## 2023-02-19 DIAGNOSIS — R21 Rash and other nonspecific skin eruption: Secondary | ICD-10-CM | POA: Diagnosis not present

## 2023-02-19 DIAGNOSIS — Z6841 Body Mass Index (BMI) 40.0 and over, adult: Secondary | ICD-10-CM

## 2023-02-19 DIAGNOSIS — R911 Solitary pulmonary nodule: Secondary | ICD-10-CM | POA: Diagnosis not present

## 2023-02-19 DIAGNOSIS — N6042 Mammary duct ectasia of left breast: Secondary | ICD-10-CM

## 2023-02-19 MED ORDER — IBUPROFEN 800 MG PO TABS: 800.0000 mg | ORAL_TABLET | Freq: Two times a day (BID) | ORAL | 2 refills | Status: DC | PRN

## 2023-02-19 MED ORDER — TRIAMCINOLONE ACETONIDE 0.025 % EX OINT: 1.0000 | TOPICAL_OINTMENT | Freq: Two times a day (BID) | CUTANEOUS | 0 refills | Status: AC

## 2023-02-19 NOTE — Patient Instructions (Addendum)
It was a pleasure seeing in clinic  We will check a thyroid test for your night sweats  Please try triamcinolone cream for the rash on your arm  Please use hot compresses under you arm for folliculitis, the area on your breast is likely a clogged pore and would also recommend using hot compresses for this as well  I have sent a referral for a mammogram and ophthalmology   Please call if symptoms are not improving

## 2023-02-19 NOTE — Progress Notes (Unsigned)
   Established Patient Office Visit  Subjective   Patient ID: Diana Huynh, female    DOB: [birth year]  Age: 42 y.o. MRN: [id number]  Chief Complaint  Patient presents with   boil under arm   Follow-up   Night Sweats   bump on nipple    HPI  Patient Active Problem List   Diagnosis Date Noted   Pregnancy test performed, pregnancy confirmed 09/07/2022   Pericarditis 04/05/2021   Respiratory infection 05/12/2020   Hemoptysis 01/20/2020   GERD (gastroesophageal reflux disease) 02/12/2019   Pulmonary nodule less than 6 cm determined by computed tomography of lung 02/12/2019   Healthcare maintenance 07/25/2018   Fibromyalgia 06/25/2018   Tobacco abuse 03/07/2018   Long-term use of Plaquenil 03/06/2018   SLE (systemic lupus erythematosus) (HCC) 12/31/2017   Pericardial effusion       ROS    Objective:     BP 134/84 (BP Location: Left Arm, Patient Position: Sitting, Cuff Size: Small)   Pulse (!) 105   Temp 98.4 F (36.9 C) (Oral)   Ht 5\' 1"  (1.549 m)   Wt 233 lb 4.8 oz (105.8 kg)   SpO2 100%   BMI 44.08 kg/m  {Vitals History (Optional):23777}  Physical Exam   No results found for any visits on 02/19/23.  {Labs (Optional):23779}  The ASCVD Risk score (Arnett DK, et al., 2019) failed to calculate for the following reasons:   Cannot find a previous HDL lab   Cannot find a previous total cholesterol lab    Assessment & Plan:   Problem List Items Addressed This Visit   None   No follow-ups on file.    Quincy Simmonds, MD

## 2023-02-19 NOTE — Assessment & Plan Note (Addendum)
Patient reports having a D&C procedure on December 19/2023 after positive pregnancy test.  Doing well since then.  No abnormal uterine bleeding since then.  LMP 5 days ago she has some concern menstrual irregularities.  Reports her periods have started on 2/20, 3/15, 4/20, 5/20.  Overall appears that her cycles have been irregular.  She expresses desire for tubular ligation for permanent contraception as she is no longer desire any more children.  Referral made to OB/GYN.

## 2023-02-20 ENCOUNTER — Encounter: Payer: Self-pay | Admitting: Student

## 2023-02-20 DIAGNOSIS — L739 Follicular disorder, unspecified: Secondary | ICD-10-CM | POA: Insufficient documentation

## 2023-02-20 DIAGNOSIS — R21 Rash and other nonspecific skin eruption: Secondary | ICD-10-CM | POA: Insufficient documentation

## 2023-02-20 DIAGNOSIS — N6042 Mammary duct ectasia of left breast: Secondary | ICD-10-CM | POA: Insufficient documentation

## 2023-02-20 DIAGNOSIS — R61 Generalized hyperhidrosis: Secondary | ICD-10-CM | POA: Insufficient documentation

## 2023-02-20 NOTE — Assessment & Plan Note (Deleted)
Previously seeing Dr. Nile Riggs for ophthalmology follow-up given long-term use of Plaquenil for SLE.  However Dr. Nile Riggs is retiring, recommended practice at [facility] but she would like something closer.  Referral made for ophthalmology.

## 2023-02-20 NOTE — Assessment & Plan Note (Signed)
No joint related symptoms today. Previously seeing Dr. Nile Riggs for ophthalmology follow-up given long-term use of Plaquenil for SLE.  However Dr. Nile Riggs is retiring, recommended practice at [facility] but she would like something closer.  Referral made for ophthalmology.

## 2023-02-20 NOTE — Assessment & Plan Note (Signed)
Reports small papule on the left nipple that she noticed a few weeks ago.  Patient reports this is asymptomatic without breast drainage, bleeding, breast mass, or pain.  Exam consistent with mammary duct ectasia.  Recommended hot compresses. No prior mammograms.  Will also order screening mammogram as she is now 59.

## 2023-02-20 NOTE — Assessment & Plan Note (Signed)
Few scattered itching papules along her right arm.  Also reports dry skin as well.  Small hypopigmented papules scattered along the right arm on exam.  May represent itching and healing she also reports.  Triamcinolone cream.

## 2023-02-20 NOTE — Assessment & Plan Note (Signed)
Notes folliculitis of the right axilla for the past week.  Has been using over-the-counter boil cream.  She is does not recall what is in the screen.  Does have some mild drainage from this area.  On exam patient with mild draining pustule c/w folliculitis on exam.  Able to express a moderate amount purulent material on exam.  No significant warmth or erythema.  Recommended hot compresses to the area and to call us if this does not resolve.

## 2023-02-20 NOTE — Assessment & Plan Note (Signed)
Incidental pulmonary nodules noted on CT on 02/05/2019 less than 6 cm.  CT angio 09/10/2019 with resolution of this. Also had a CT PE study on 01/20/2020 without recurrence of pulmonary nodules

## 2023-02-20 NOTE — Assessment & Plan Note (Signed)
BMI is 44 today.Notable to have medically 30 pound weight gain since 02/27/2022.  She associates weight gain with pregnancy at that time.  She has since had a D&C.  We discussed diet and exercise to help with weight loss.

## 2023-02-20 NOTE — Assessment & Plan Note (Signed)
Reports some night sweats that has now progressed to daytime sweating.  States this can happen inappropriately even at rest.  Does.  Feeling warmth when this happens but otherwise does not have shortness of breath or chest pain during these episodes.  She does report some difficulty with breathing when she raises both her arms.  Also feels that her skin is very dry and itchy lately.  States she discussed this with her rheumatologist.  Appears prior TB testing was within its.  Given that she is having normal menstrual cycles the last 4 months have lower suspicion for primary ovarian failure.  Will check TSH today.

## 2023-02-21 LAB — TSH: TSH: 1.56 u[IU]/mL (ref 0.450–4.500)

## 2023-02-22 NOTE — Progress Notes (Signed)
Internal Medicine Clinic Attending ? ?Case discussed with Dr. Liang  At the time of the visit.  We reviewed the resident?s history and exam and pertinent patient test results.  I agree with the assessment, diagnosis, and plan of care documented in the resident?s note. ? ?

## 2023-02-25 ENCOUNTER — Encounter: Payer: Self-pay | Admitting: *Deleted

## 2023-03-18 ENCOUNTER — Other Ambulatory Visit: Payer: Self-pay | Admitting: Internal Medicine

## 2023-03-18 DIAGNOSIS — N6042 Mammary duct ectasia of left breast: Secondary | ICD-10-CM

## 2023-03-18 DIAGNOSIS — R911 Solitary pulmonary nodule: Secondary | ICD-10-CM

## 2023-03-18 DIAGNOSIS — Z1231 Encounter for screening mammogram for malignant neoplasm of breast: Secondary | ICD-10-CM

## 2023-03-18 DIAGNOSIS — R21 Rash and other nonspecific skin eruption: Secondary | ICD-10-CM

## 2023-03-18 DIAGNOSIS — Z3009 Encounter for other general counseling and advice on contraception: Secondary | ICD-10-CM

## 2023-03-18 DIAGNOSIS — M3219 Other organ or system involvement in systemic lupus erythematosus: Secondary | ICD-10-CM

## 2023-03-18 DIAGNOSIS — Z79899 Other long term (current) drug therapy: Secondary | ICD-10-CM

## 2023-03-18 DIAGNOSIS — M329 Systemic lupus erythematosus, unspecified: Secondary | ICD-10-CM

## 2023-03-18 DIAGNOSIS — R61 Generalized hyperhidrosis: Secondary | ICD-10-CM

## 2023-03-18 DIAGNOSIS — Z3201 Encounter for pregnancy test, result positive: Secondary | ICD-10-CM

## 2023-03-18 DIAGNOSIS — L739 Follicular disorder, unspecified: Secondary | ICD-10-CM

## 2023-04-10 ENCOUNTER — Ambulatory Visit
Admission: RE | Admit: 2023-04-10 | Discharge: 2023-04-10 | Disposition: A | Discharge status: Home / Self Care | Payer: Medicaid Other | Source: Ambulatory Visit | Attending: Internal Medicine | Admitting: Internal Medicine

## 2023-04-10 DIAGNOSIS — Z1231 Encounter for screening mammogram for malignant neoplasm of breast: Secondary | ICD-10-CM

## 2023-04-12 ENCOUNTER — Other Ambulatory Visit: Payer: Self-pay | Admitting: Internal Medicine

## 2023-04-12 DIAGNOSIS — R928 Other abnormal and inconclusive findings on diagnostic imaging of breast: Secondary | ICD-10-CM

## 2023-04-15 ENCOUNTER — Ambulatory Visit: Admission: RE | Admit: 2023-04-15 | Payer: Medicaid Other | Source: Ambulatory Visit

## 2023-04-15 ENCOUNTER — Ambulatory Visit
Admission: RE | Admit: 2023-04-15 | Discharge: 2023-04-15 | Disposition: A | Discharge status: Home / Self Care | Payer: Medicaid Other | Source: Ambulatory Visit | Attending: Internal Medicine | Admitting: Internal Medicine

## 2023-04-15 ENCOUNTER — Other Ambulatory Visit: Payer: Self-pay | Admitting: Internal Medicine

## 2023-04-15 DIAGNOSIS — N6325 Unspecified lump in the left breast, overlapping quadrants: Secondary | ICD-10-CM | POA: Diagnosis not present

## 2023-04-15 DIAGNOSIS — N6320 Unspecified lump in the left breast, unspecified quadrant: Secondary | ICD-10-CM

## 2023-04-15 DIAGNOSIS — R92322 Mammographic fibroglandular density, left breast: Secondary | ICD-10-CM | POA: Diagnosis not present

## 2023-04-15 DIAGNOSIS — R928 Other abnormal and inconclusive findings on diagnostic imaging of breast: Secondary | ICD-10-CM

## 2023-04-19 ENCOUNTER — Ambulatory Visit (INDEPENDENT_AMBULATORY_CARE_PROVIDER_SITE_OTHER): Payer: Medicaid Other | Admitting: Obstetrics & Gynecology

## 2023-04-19 ENCOUNTER — Encounter: Payer: Self-pay | Admitting: Obstetrics & Gynecology

## 2023-04-19 VITALS — BP 138/91 | HR 109 | Wt 231.9 lb

## 2023-04-19 DIAGNOSIS — N946 Dysmenorrhea, unspecified: Secondary | ICD-10-CM | POA: Diagnosis not present

## 2023-04-19 DIAGNOSIS — N951 Menopausal and female climacteric states: Secondary | ICD-10-CM

## 2023-04-19 DIAGNOSIS — Z3009 Encounter for other general counseling and advice on contraception: Secondary | ICD-10-CM | POA: Diagnosis not present

## 2023-04-19 MED ORDER — GABAPENTIN 600 MG PO TABS
600.0000 mg | ORAL_TABLET | Freq: Every day | ORAL | 1 refills | Status: DC
Start: 2023-04-19 — End: 2023-06-11

## 2023-04-19 NOTE — Progress Notes (Signed)
GYNECOLOGY OFFICE VISIT NOTE  History:   Diana Huynh is a 42 y.o. [phone]  with history of SLE, fibromyalgia here today for discussion about contraception.  Also reports bothersome night sweats.  Repots regular menses but has cramping during the periods, alleviated on Ibuprofen. She denies any abnormal vaginal discharge, bleeding, pelvic pain or other concerns.    Past Medical History:  Diagnosis Date   Bronchitis    Fibromyalgia    Lupus (HCC)    Pericarditis    per pt report   Pleuritis 12/31/2017    Past Surgical History:  Procedure Laterality Date   ANKLE SURGERY     APPENDECTOMY     CESAREAN SECTION      The following portions of the patient's history were reviewed and updated as appropriate: allergies, current medications, past family history, past medical history, past social history, past surgical history and problem list.   Health Maintenance:  Normal pap and negative HRHPV on 05/23/2021.  Normal mammogram on 04/15/2023.   Review of Systems:  Pertinent items noted in HPI and remainder of comprehensive ROS otherwise negative.  Physical Exam:  BP (!) 138/91   Pulse (!) 109   Wt 231 lb 14.4 oz (105.2 kg)   BMI 43.82 kg/m  CONSTITUTIONAL: Well-developed, well-nourished female in no acute distress.  HEENT:  Normocephalic, atraumatic. External right and left ear normal. No scleral icterus.  NECK: Normal range of motion, supple, no masses noted on observation SKIN: No rash noted. Not diaphoretic. No erythema. No pallor. MUSCULOSKELETAL: Normal range of motion. No edema noted. NEUROLOGIC: Alert and oriented to person, place, and time. Normal muscle tone coordination. No cranial nerve deficit noted. PSYCHIATRIC: Normal mood and affect. Normal behavior. Normal judgment and thought content. CARDIOVASCULAR: Normal heart rate noted RESPIRATORY: Effort and breath sounds normal, no problems with respiration noted ABDOMEN: No masses noted. No other overt distention noted.    PELVIC: Deferred     Assessment and Plan:     1. Perimenopausal vasomotor symptoms Discussed management options, she wants to avoid hormonal therapy.  Will do trial of Neurontin for now.  If this does not help, consider Effexor/Paxil. - gabapentin (NEURONTIN) 600 MG tablet; Take 1 tablet (600 mg total) by mouth at bedtime.  Dispense: 30 tablet; Refill: 1  2. Dysmenorrhea Continue Ibuprofen, may need hormonal intervention if not controlled  3. Encounter for counseling regarding contraception Reviewed all forms of birth control options available including abstinence; fertility period awareness methods; over the counter/barrier methods; hormonal contraceptive medication including pill, patch, ring, injection,contraceptive implant; hormonal and nonhormonal IUDs; permanent sterilization options including vasectomy and the various tubal sterilization modalities. Risks and benefits reviewed.  Questions were answered.  Information was given to patient to review.  She will discuss with her partner.  Was interested in sterilization, but doe not like the surgical risks or possible post tubal syndrome.  Does not want IUD or Nexplanon, does not want hormones.  Advised to do condoms for now, talk to partner about vasectomy.  Routine preventative health maintenance measures emphasized. Please refer to After Visit Summary for other counseling recommendations.   Return for follow up as recommended.    I spent  45  minutes dedicated to the care of this patient including pre-visit review of records, face to face time with the patient discussing her conditions and treatments and post visit orders.    Jaynie Collins, MD, FACOG Obstetrician & Gynecologist, [facility] for [employer], [facility]

## 2023-04-30 NOTE — Progress Notes (Signed)
Patient aware of MRI results and has follow up ultrasound scheduled in January 2025.

## 2023-05-08 ENCOUNTER — Emergency Department (HOSPITAL_COMMUNITY)
Admission: EM | Admit: 2023-05-08 | Discharge: 2023-05-08 | Disposition: A | Discharge status: Home / Self Care | Payer: Medicaid Other | Attending: Emergency Medicine | Admitting: Emergency Medicine

## 2023-05-08 ENCOUNTER — Other Ambulatory Visit: Payer: Self-pay

## 2023-05-08 ENCOUNTER — Emergency Department (HOSPITAL_COMMUNITY): Payer: Medicaid Other

## 2023-05-08 ENCOUNTER — Encounter (HOSPITAL_COMMUNITY): Payer: Self-pay | Admitting: Emergency Medicine

## 2023-05-08 DIAGNOSIS — R519 Headache, unspecified: Secondary | ICD-10-CM | POA: Diagnosis not present

## 2023-05-08 DIAGNOSIS — H532 Diplopia: Secondary | ICD-10-CM | POA: Diagnosis not present

## 2023-05-08 LAB — CBC
HCT: 34.2 % — ABNORMAL LOW (ref 36.0–46.0)
Hemoglobin: 11 g/dL — ABNORMAL LOW (ref 12.0–15.0)
MCH: 26.8 pg (ref 26.0–34.0)
MCHC: 32.2 g/dL (ref 30.0–36.0)
MCV: 83.4 fL (ref 80.0–100.0)
Platelets: 243 10*3/uL (ref 150–400)
RBC: 4.1 MIL/uL (ref 3.87–5.11)
RDW: 17.2 % — ABNORMAL HIGH (ref 11.5–15.5)
WBC: 5.3 10*3/uL (ref 4.0–10.5)
nRBC: 0 % (ref 0.0–0.2)

## 2023-05-08 LAB — COMPREHENSIVE METABOLIC PANEL
ALT: 18 U/L (ref 0–44)
AST: 20 U/L (ref 15–41)
Albumin: 4.4 g/dL (ref 3.5–5.0)
Alkaline Phosphatase: 47 U/L (ref 38–126)
Anion gap: 9 (ref 5–15)
BUN: 10 mg/dL (ref 6–20)
CO2: 24 mmol/L (ref 22–32)
Calcium: 9 mg/dL (ref 8.9–10.3)
Chloride: 105 mmol/L (ref 98–111)
Creatinine, Ser: 0.91 mg/dL (ref 0.44–1.00)
GFR, Estimated: >60 mL/min (ref >60)
Glucose, Bld: 104 mg/dL — ABNORMAL HIGH (ref 70–99)
Potassium: 3.6 mmol/L (ref 3.5–5.1)
Sodium: 138 mmol/L (ref 135–145)
Total Bilirubin: 0.4 mg/dL (ref 0.3–1.2)
Total Protein: 8.4 g/dL — ABNORMAL HIGH (ref 6.5–8.1)

## 2023-05-08 LAB — POC URINE PREG, ED: Preg Test, Ur: NEGATIVE

## 2023-05-08 LAB — C-REACTIVE PROTEIN: CRP: 0.5 mg/dL (ref <1.0)

## 2023-05-08 MED ORDER — GADOBUTROL 1 MMOL/ML IV SOLN
10.0000 mL | Freq: Once | INTRAVENOUS | Status: AC | PRN
  Administered 2023-05-08: 10 mL via INTRAVENOUS

## 2023-05-08 MED ORDER — DIPHENHYDRAMINE HCL 50 MG/ML IJ SOLN
50.0000 mg | Freq: Once | INTRAMUSCULAR | Status: AC
  Administered 2023-05-08: 50 mg via INTRAVENOUS
  Filled 2023-05-08: qty 1

## 2023-05-08 MED ORDER — PROCHLORPERAZINE EDISYLATE 10 MG/2ML IJ SOLN
10.0000 mg | Freq: Once | INTRAMUSCULAR | Status: AC
  Administered 2023-05-08: 10 mg via INTRAVENOUS
  Filled 2023-05-08: qty 2

## 2023-05-08 MED ORDER — BUTALBITAL-APAP-CAFFEINE 50-325-40 MG PO TABS: 1.0000 | ORAL_TABLET | Freq: Four times a day (QID) | ORAL | 0 refills | Status: DC | PRN

## 2023-05-08 NOTE — ED Notes (Signed)
Pt updated on dc instructions and verbalizes understanding, pt taken home by family

## 2023-05-08 NOTE — ED Provider Notes (Signed)
[place] EMERGENCY DEPARTMENT AT [facility] Provider Note   CSN: [id number] Arrival date & time: 05/08/23  1605     History Chief Complaint  Patient presents with   Headache    HPI Diana Huynh is a 42 y.o. female presenting for chief complaint of headache.  Has been going on for 3 months.  She denies fevers chills nausea vomiting syncope shortness of breath.  She does have daily headache and was referred to an ophthalmologist due to her medical history.  Ophthalmologist appreciated papilledema and sent patient to the emergency department for extensive evaluation, they requested an MRI orbit to evaluate for neuritis, MRI brain with and without to evaluate for mass, MRI/MRV to evaluate for dural venous sinus thrombosis.   Patient's recorded medical, surgical, social, medication list and allergies were reviewed in the Snapshot window as part of the initial history.   Review of Systems   Review of Systems  Constitutional:  Negative for chills and fever.  HENT:  Negative for ear pain and sore throat.   Eyes:  Negative for pain and visual disturbance.  Respiratory:  Negative for cough and shortness of breath.   Cardiovascular:  Negative for chest pain and palpitations.  Gastrointestinal:  Negative for abdominal pain and vomiting.  Genitourinary:  Negative for dysuria and hematuria.  Musculoskeletal:  Negative for arthralgias and back pain.  Skin:  Negative for color change and rash.  Neurological:  Positive for headaches. Negative for seizures and syncope.  All other systems reviewed and are negative.   Physical Exam Updated Vital Signs BP 128/77   Pulse 91   Temp 98.5 F (36.9 C) (Oral)   Resp 16   SpO2 100%  Physical Exam Vitals and nursing note reviewed.  Constitutional:      General: She is not in acute distress.    Appearance: She is well-developed.  HENT:     Head: Normocephalic and atraumatic.  Eyes:     Conjunctiva/sclera: Conjunctivae normal.   Cardiovascular:     Rate and Rhythm: Normal rate and regular rhythm.     Heart sounds: No murmur heard. Pulmonary:     Effort: Pulmonary effort is normal. No respiratory distress.     Breath sounds: Normal breath sounds.  Abdominal:     General: There is no distension.     Palpations: Abdomen is soft.     Tenderness: There is no abdominal tenderness. There is no right CVA tenderness or left CVA tenderness.  Musculoskeletal:        General: No swelling or tenderness. Normal range of motion.     Cervical back: Neck supple.  Skin:    General: Skin is warm and dry.  Neurological:     General: No focal deficit present.     Mental Status: She is alert and oriented to person, place, and time. Mental status is at baseline.     Cranial Nerves: No cranial nerve deficit.      ED Course/ Medical Decision Making/ A&P    Procedures Procedures   Medications Ordered in ED Medications  prochlorperazine (COMPAZINE) injection 10 mg (10 mg Intravenous Given 05/08/23 1901)  diphenhydrAMINE (BENADRYL) injection 50 mg (50 mg Intravenous Given 05/08/23 1858)  gadobutrol (GADAVIST) 1 MMOL/ML injection 10 mL (10 mLs Intravenous Contrast Given 05/08/23 2033)    Medical Decision Making:   [name] is a 42 y.o. female who presented to the ED today with chronic headache detailed above.     Complete initial physical exam  performed, notably the patient  was hemodynamically stable in no acute distress.    Reviewed and confirmed nursing documentation for past medical history, family history, social history.    Initial Assessment:   With the patient's presentation of headache, most likely diagnosis is tension type headaches vs atypical migraines. Other diagnoses were considered including (but not limited to) intracranial mass, intracranial hemorrhage, intracranial infection including meningitis vs encephalitis, GCA, trigeminal neuralgia. These are considered less likely due to history of present illness and  physical exam findings.    This is most consistent with an acute life/limb threatening illness complicated by underlying chronic conditions.   Timeline and slow onset is not consistent with SAH/ICH   Age and description of pain is not consistent with GCA   Lack of fever,meningismus is not consistent with meningitis/encephalitis   Initial Plan:  Will initiate treatment with Compazine/Benardryll/NSAIDS/Tylenol for treatment of nonspecific headache  Screening labs including CBC and Metabolic panel to evaluate for infectious or metabolic etiology of disease.  Urinalysis with reflex culture ordered to evaluate for UTI or relevant urologic/nephrologic pathology.  Given hyperlipidemia detected by the ophthalmologist, followed the recommendation with MRI brain and MR orbits MRV head to evaluate for multifocal intracranial pathology. Objective evaluation as below reviewed   Initial Study Results:   Laboratory  All laboratory results reviewed without evidence of clinically relevant pathology.   Radiology:  All images reviewed independently. Agree with radiology report at this time.   MR Brain W and Wo Contrast  Final Result    MR ORBITS W WO CONTRAST  Final Result    MR MRV HEAD W WO CONTRAST  Final Result     Final Assessment and Plan:   Headache is completely resolved on my assessment of the patient after 5 hours of observation in the emergency room.  MRIs negative for any acute pathology. Will refer patient to neurology for further care and management the outpatient setting. Disposition:  I have considered need for hospitalization, however, considering all of the above, I believe this patient is stable for discharge at this time.  Patient/family educated about specific return precautions for given chief complaint and symptoms.  Patient/family educated about follow-up with PCP and neurology.     Patient/family expressed understanding of return precautions and need for follow-up. Patient  spoken to regarding all imaging and laboratory results and appropriate follow up for these results. All education provided in verbal form with additional information in written form. Time was allowed for answering of patient questions. Patient discharged.    Emergency Department Medication Summary:   Medications  prochlorperazine (COMPAZINE) injection 10 mg (10 mg Intravenous Given 05/08/23 1901)  diphenhydrAMINE (BENADRYL) injection 50 mg (50 mg Intravenous Given 05/08/23 1858)  gadobutrol (GADAVIST) 1 MMOL/ML injection 10 mL (10 mLs Intravenous Contrast Given 05/08/23 2033)        Clinical Impression:  1. Nonintractable headache, unspecified chronicity pattern, unspecified headache type      Discharge   Final Clinical Impression(s) / ED Diagnoses Final diagnoses:  Nonintractable headache, unspecified chronicity pattern, unspecified headache type    Rx / DC Orders ED Discharge Orders          Ordered    butalbital-acetaminophen-caffeine (FIORICET) 50-325-40 MG tablet  Every 6 hours PRN        05/08/23 2110    Ambulatory referral to Neurology       Comments: An appointment is requested in approximately: 2 weeks   05/08/23 2111  Glyn Ade, MD 05/08/23 2113

## 2023-05-08 NOTE — ED Triage Notes (Signed)
Pt reports headache, blurry vision, and double vision. Pt reports she was sent by her eye Dr for an MRI to rule out CNS vasculitis and multiple other conditions.

## 2023-05-16 DIAGNOSIS — M7918 Myalgia, other site: Secondary | ICD-10-CM | POA: Diagnosis not present

## 2023-05-16 DIAGNOSIS — M4802 Spinal stenosis, cervical region: Secondary | ICD-10-CM | POA: Diagnosis not present

## 2023-05-16 DIAGNOSIS — M542 Cervicalgia: Secondary | ICD-10-CM | POA: Diagnosis not present

## 2023-05-16 DIAGNOSIS — G894 Chronic pain syndrome: Secondary | ICD-10-CM | POA: Diagnosis not present

## 2023-05-30 ENCOUNTER — Telehealth: Payer: Self-pay | Admitting: Family Medicine

## 2023-05-30 NOTE — Telephone Encounter (Signed)
Diana Huynh from [facility] called to inform that this patient was prescribed Gabapentin with our office and she was already prescribed Lyrica with there office, she would like someone to call her to discuss this.

## 2023-06-06 NOTE — Telephone Encounter (Signed)
Called and spoke with [facility], CMA that reports they wanted to reach out as patient was prescribed Gabapentin in addition to Lyrica and that they are weaning the Lyrica as she is on the Gabapentin and [facility] does not wish to titrate the Gabapentin since they did not start the medication. Will send message to Dr. Macon Large for advisement.

## 2023-06-06 NOTE — Telephone Encounter (Signed)
The night time Neurontin was prescribed to help with nighttime perimenopausal symptoms.  I read her 05/16/23 note by Alecia Lemming, PA-C and there was no problem with this and the prescribed Lyrica. Below is his recommended plan of care:  -Discontinue: Lyrica 150 mg three times daily over a slow titration - Last filled 04/17/23. "She is aware that if this change increases her neuropathic component of pain we may need to further titrate Gabapentin in the future".  - Continue: Flexeril 10 mg three times daily as needed  - Continue other medications, including Gabapentin, Cymbalta, topical Voltaren gel, and topical Lidocaine patches, as prescribed by an outside provider.   The Neurontin can continue to be titrated by [facility] as needed. I apologize for whatever confusion this caused with her medication regimen.   Jaynie Collins, MD

## 2023-06-10 ENCOUNTER — Telehealth: Payer: Self-pay | Admitting: Diagnostic Neuroimaging

## 2023-06-10 NOTE — Progress Notes (Unsigned)
[facility]  PATIENT: Diana Huynh DOB: September 13, 1981  REFERRING DOCTOR OR PCP: Glyn Ade, MD; Elza Rafter, DO; Georges Mouse, MD SOURCE: Patient, notes from ophthalmology, imaging and lab reports, MRI images personally reviewed.  _________________________________   HISTORICAL  CHIEF COMPLAINT:  No chief complaint on file.   HISTORY OF PRESENT ILLNESS:  I had the pleasure of seeing patient,Diana Huynh, at [facility] for neurologic consultation regarding her optic nerve edema and headaches.  She is a 42 yo woman who noted headaches and visual blurring around April 2024.  Initially, symptoms were mild but she felt that they worsened in May and June.Marland Kitchen  She wakes up with a bad HA since June 2024.  Headaches are bilateral with a pounding quality.  Initially she was felt to be having a lupus flare.   She saw optometry for her annular Plaquenil exam (to assess for macular edema) and then was referred to Dr. Georges Mouse (Ophthalmology) due to bilateral optic disc edema.   This exam confirmed the optic disc edema.  Additional testing showed she had reduced visual fields bilaterally.   VA was 20/25-1 each eye.   IOP was normal.    She went to the ED for further evaluation.  She had MRI/MRV that were normal.  Of note, no empty sella is noted.   Optic nerve sheaths are normal diameter.   No thrombosis noted.  She was given a prescription for butalbital.   She feels that the butalbital has helped the headaches.  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    She sees Dr. Letta Pate at [facility].     She sees Pain Management in [facility] (Dr. Anthonette Legato) and reports she will be having a cervical ESI.    She snores but has no witnessed OSA.      Imaging: MRI of the brain 05/08/2023 was normal.  MRI of the orbits 05/08/2023 was normal.  MR venogram of the head 05/08/2023 showed mild asymmetry of the transverse sinuses  but there did not appear to be any thrombosis.  MRI cervical spine 101/2022 showed disc protrusion at C4-C5 with resultant mild spinal stenosis.   Milder DDD at [facility] and C5C6.   Foci in parotid glands were also seen.      REVIEW OF SYSTEMS: Constitutional: No fevers, chills, sweats, or change in appetite Eyes: As above\ Ear, nose and throat: No hearing loss, ear pain, nasal congestion, sore throat Cardiovascular: No chest pain, palpitations Respiratory:  No shortness of breath at rest or with exertion.   No wheezes GastrointestinaI: No nausea, vomiting, diarrhea, abdominal pain, fecal incontinence Genitourinary:  No dysuria, urinary retention or frequency.  No nocturia. Musculoskeletal: She reports a lot of joint pain and muscle pain Integumentary: No rash, pruritus, skin lesions Neurological: as above Psychiatric: No depression at this time.  No anxiety Endocrine: No palpitations, diaphoresis, change in appetite, change in weigh or increased thirst Hematologic/Lymphatic:  No anemia, purpura, petechiae. Allergic/Immunologic: No itchy/runny eyes, nasal congestion, recent allergic reactions, rashes  ALLERGIES: No Known Allergies  HOME MEDICATIONS:  Current Outpatient Medications:    albuterol (VENTOLIN HFA) 108 (90 Base) MCG/ACT inhaler, Inhale 1-2 puffs into the lungs every 6 (six) hours as needed for wheezing or shortness of breath., Disp: 8 g, Rfl: 0   amLODipine (NORVASC) 5 MG tablet, Take 5 mg by mouth daily., Disp: , Rfl:    butalbital-acetaminophen-caffeine (FIORICET) 50-325-40 MG tablet, Take  1-2 tablets by mouth every 6 (six) hours as needed for headache., Disp: 20 tablet, Rfl: 0   cyclobenzaprine (FLEXERIL) 10 MG tablet, Take 10 mg by mouth in the morning and at bedtime., Disp: , Rfl:    DULoxetine (CYMBALTA) 60 MG capsule, Take 60 mg by mouth every evening., Disp: , Rfl:    famotidine (PEPCID) 20 MG tablet, Take 20 mg by mouth daily. , Disp: , Rfl:    gabapentin  (NEURONTIN) 600 MG tablet, Take 1 tablet (600 mg total) by mouth at bedtime., Disp: 30 tablet, Rfl: 1   hydroxychloroquine (PLAQUENIL) 200 MG tablet, Take 400 mg by mouth daily., Disp: , Rfl:    ibuprofen (ADVIL) 800 MG tablet, Take 1 tablet (800 mg total) by mouth 2 (two) times daily as needed., Disp: 60 tablet, Rfl: 2   lidocaine (XYLOCAINE) 5 % ointment, Apply 1 Application topically as needed., Disp: 35.44 g, Rfl: 0   pregabalin (LYRICA) 150 MG capsule, Take 150 mg by mouth 2 (two) times daily., Disp: , Rfl:    triamcinolone (KENALOG) 0.025 % ointment, Apply 1 Application topically 2 (two) times daily., Disp: 30 g, Rfl: 0  PAST MEDICAL HISTORY: Past Medical History:  Diagnosis Date   Bronchitis    Fibromyalgia    Lupus (HCC)    Pericarditis    per pt report   Pleuritis 12/31/2017    PAST SURGICAL HISTORY: Past Surgical History:  Procedure Laterality Date   ANKLE SURGERY     APPENDECTOMY     CESAREAN SECTION      FAMILY HISTORY: Family History  Problem Relation Age of Onset   Hypertension Mother    Heart murmur Mother    Healthy Father    Thyroid disease Sister    Breast cancer Paternal Grandmother    Breast cancer Paternal Aunt    Heart failure Sister        has ICD   Cervical cancer Sister     SOCIAL HISTORY: Social History   Socioeconomic History   Marital status: Married    Spouse name: Not on file   Number of children: Not on file   Years of education: Not on file   Highest education level: Not on file  Occupational History   Not on file  Tobacco Use   Smoking status: Every Day    Current packs/day: 0.00    Types: Cigarettes    Last attempt to quit: 09/20/2018    Years since quitting: 4.7   Smokeless tobacco: Never   Tobacco comments:    4-5 cigs/day  Substance and Sexual Activity   Alcohol use: Yes    Comment: Occasionally.   Drug use: No   Sexual activity: Yes  Other Topics Concern   Not on file  Social History Narrative   Not on file    Social Determinants of Health   Financial Resource Strain: Low Risk  (09/06/2022)   Overall Financial Resource Strain (CARDIA)    Difficulty of Paying Living Expenses: Not very hard  Food Insecurity: Food Insecurity Present (04/19/2023)   Hunger Vital Sign    Worried About Running Out of Food in the Last Year: Sometimes true    Ran Out of Food in the Last Year: Sometimes true  Transportation Needs: No Transportation Needs (04/19/2023)   PRAPARE - Administrator, Civil Service (Medical): No    Lack of Transportation (Non-Medical): No  Physical Activity: Not on file  Stress: Not on file  Social Connections: Socially Isolated (  09/06/2022)   Social Connection and Isolation Panel [NHANES]    Frequency of Communication with Friends and Family: Never    Frequency of Social Gatherings with Friends and Family: Never    Attends Religious Services: Never    Database administrator or Organizations: No    Attends Banker Meetings: Never    Marital Status: Married  Catering manager Violence: Not At Risk (09/06/2022)   Humiliation, Afraid, Rape, and Kick questionnaire    Fear of Current or Ex-Partner: No    Emotionally Abused: No    Physically Abused: No    Sexually Abused: No       PHYSICAL EXAM  There were no vitals filed for this visit.  There is no height or weight on file to calculate BMI.   General: The patient is well-developed and well-nourished and in no acute distress  HEENT:  Head is [place]/AT.  Sclera are anicteric.  Funduscopic exam shows left > right optic nerve edema  Neck: No carotid bruits are noted.  The neck is tender.  Ok ROM  Cardiovascular: The heart has a regular rate and rhythm with a normal S1 and S2. There were no murmurs, gallops or rubs.    Skin: Extremities are without rash or  edema.  Musculoskeletal:  Back is nontender  Neurologic Exam  Mental status: The patient is alert and oriented x 3 at the time of the examination. The  patient has apparent normal recent and remote memory, with an apparently normal attention span and concentration ability.   Speech is normal.  Cranial nerves: Extraocular movements are full. Pupils are equal, round, and reactive to light and accomodation.      There is good facial sensation to soft touch bilaterally.Facial strength is normal.  Trapezius and sternocleidomastoid strength is normal. No dysarthria is noted.  The tongue is midline, and the patient has symmetric elevation of the soft palate. No obvious hearing deficits are noted.  Motor:  Muscle bulk is normal.   Tone is normal. Strength is  5 / 5 in all 4 extremities.   Sensory: Sensory testing is intact to pinprick, soft touch and vibration sensation in all 4 extremities.  Coordination: Cerebellar testing reveals good finger-nose-finger and heel-to-shin bilaterally.  Gait and station: Station is normal.   Gait is arthritic . Tandem gait is mildly wide. Romberg is negative.   Reflexes: Deep tendon reflexes are symmetric and normal bilaterally.       DIAGNOSTIC DATA (LABS, IMAGING, TESTING) - I reviewed patient records, labs, notes, testing and imaging myself where available.  Lab Results  Component Value Date   WBC 5.3 05/08/2023   HGB 11.0 (L) 05/08/2023   HCT 34.2 (L) 05/08/2023   MCV 83.4 05/08/2023   PLT 243 05/08/2023      Component Value Date/Time   NA 138 05/08/2023 1703   NA 137 05/12/2020 1727   K 3.6 05/08/2023 1703   CL 105 05/08/2023 1703   CO2 24 05/08/2023 1703   GLUCOSE 104 (H) 05/08/2023 1703   BUN 10 05/08/2023 1703   BUN 9 05/12/2020 1727   CREATININE 0.91 05/08/2023 1703   CALCIUM 9.0 05/08/2023 1703   PROT 8.4 (H) 05/08/2023 1703   PROT 7.1 01/30/2018 1042   ALBUMIN 4.4 05/08/2023 1703   ALBUMIN 4.4 01/30/2018 1042   AST 20 05/08/2023 1703   ALT 18 05/08/2023 1703   ALKPHOS 47 05/08/2023 1703   BILITOT 0.4 05/08/2023 1703   BILITOT <0.2 01/30/2018 1042  GFRNONAA >60 05/08/2023 1703    GFRAA 96 05/12/2020 1727   Lab Results  Component Value Date   CHOL 119 12/27/2017   HDL 31 (L) 12/27/2017   LDLCALC 80 12/27/2017   TRIG 41 12/27/2017   CHOLHDL 3.8 12/27/2017   Lab Results  Component Value Date   HGBA1C 5.3 12/27/2017   No results found for: "VITAMINB12" Lab Results  Component Value Date   TSH 1.560 02/19/2023       ASSESSMENT AND PLAN  Optic nerve edema - Plan: DG FL GUIDED LUMBAR PUNCTURE, CANCELED: DG FL GUIDED LUMBAR PUNCTURE  Chronic nonintractable headache, unspecified headache type  Visual field defect   In summary, Ms. Sturtevant is a 42 year old woman with bilateral optic nerve edema, headache and visual blurring.  Her exam was otherwise remarkable for myalgias..  She has reduced visual fields on formal testing.  Imaging studies showed normal brain, optic nerves and normal MRV.  I discussed with her that I am most concerned about idiopathic intracranial hypertension.  It is most common in the 30s and 40s and overweight women.  Therefore, we need to have her do a lumbar puncture so opening pressure can be measured.  If the opening pressure is elevated, then I recommended that she initiate acetazolamide 500 mg p.o. twice daily.  If pressures are not elevated, other etiology would need to be considered.  Presentation would not be typical for AION or optic neuritis.  She will return to see [place] in 3 to 4 months or sooner if there are new or worsening neurologic symptoms or based on the results of the studies.   Prudy Candy A. Epimenio Foot, MD, [facility] 06/10/2023, 5:59 PM Certified in Neurology, Clinical Neurophysiology, Sleep Medicine and Neuroimaging  [facility] [address] [place], [place] [address] [phone]

## 2023-06-10 NOTE — Telephone Encounter (Signed)
REQUIRED PHONE [email or web address] called asking if pt could be seen earlier.

## 2023-06-11 ENCOUNTER — Ambulatory Visit: Payer: Medicaid Other | Admitting: Neurology

## 2023-06-11 ENCOUNTER — Encounter: Payer: Self-pay | Admitting: Neurology

## 2023-06-11 VITALS — BP 128/85 | HR 86 | Ht 61.0 in | Wt 227.6 lb

## 2023-06-11 DIAGNOSIS — G8929 Other chronic pain: Secondary | ICD-10-CM | POA: Diagnosis not present

## 2023-06-11 DIAGNOSIS — H5340 Unspecified visual field defects: Secondary | ICD-10-CM | POA: Diagnosis not present

## 2023-06-11 DIAGNOSIS — H4710 Unspecified papilledema: Secondary | ICD-10-CM | POA: Diagnosis not present

## 2023-06-11 DIAGNOSIS — R519 Headache, unspecified: Secondary | ICD-10-CM | POA: Diagnosis not present

## 2023-06-11 MED ORDER — BUTALBITAL-APAP-CAFFEINE 50-325-40 MG PO TABS: 1.0000 | ORAL_TABLET | Freq: Four times a day (QID) | ORAL | 0 refills | Status: DC | PRN

## 2023-06-12 DIAGNOSIS — R519 Headache, unspecified: Secondary | ICD-10-CM | POA: Diagnosis not present

## 2023-06-12 DIAGNOSIS — G8929 Other chronic pain: Secondary | ICD-10-CM | POA: Diagnosis not present

## 2023-06-12 DIAGNOSIS — M329 Systemic lupus erythematosus, unspecified: Secondary | ICD-10-CM | POA: Diagnosis not present

## 2023-06-12 DIAGNOSIS — I7300 Raynaud's syndrome without gangrene: Secondary | ICD-10-CM | POA: Diagnosis not present

## 2023-06-12 DIAGNOSIS — M328 Other forms of systemic lupus erythematosus: Secondary | ICD-10-CM | POA: Diagnosis not present

## 2023-06-12 DIAGNOSIS — M797 Fibromyalgia: Secondary | ICD-10-CM | POA: Diagnosis not present

## 2023-06-12 DIAGNOSIS — H4710 Unspecified papilledema: Secondary | ICD-10-CM | POA: Diagnosis not present

## 2023-06-12 DIAGNOSIS — Z79899 Other long term (current) drug therapy: Secondary | ICD-10-CM | POA: Diagnosis not present

## 2023-06-17 NOTE — Discharge Instructions (Signed)
Lumbar Puncture Discharge Instructions  Go home and rest quietly as needed. You may resume normal activities; however, do not exert yourself strongly or do any heavy lifting today and tomorrow.   DO NOT drive today.    You may resume your normal diet and medications unless otherwise indicated. Drink lots of extra fluids today and tomorrow.   The incidence of headache, nausea, or vomiting is about 5% (one in 20 patients).  If you develop a headache, lie flat for 24 hours and drink plenty of fluids until the headache goes away.  Caffeinated beverages may be helpful. If when you get up you still have a headache when standing, go back to bed and force fluids for another 24 hours.   If you develop severe nausea and vomiting or a headache that does not go away with the flat bedrest after 48 hours, please call [phone].   Call your physician for a follow-up appointment.  The results of your Lumbar Puncture will be sent directly to your physician and they will contact you.   If you have any questions or if complications develop after you arrive home, please call [phone].  Discharge instructions have been explained to the patient.  The patient, or the person responsible for the patient, fully understands these instructions.   Thank you for visiting our office today.

## 2023-06-18 ENCOUNTER — Ambulatory Visit
Admission: RE | Admit: 2023-06-18 | Discharge: 2023-06-18 | Disposition: A | Discharge status: Home / Self Care | Payer: Medicaid Other | Source: Ambulatory Visit | Attending: Neurology | Admitting: Neurology

## 2023-06-18 VITALS — BP 158/79 | HR 89

## 2023-06-18 DIAGNOSIS — H4710 Unspecified papilledema: Secondary | ICD-10-CM | POA: Diagnosis not present

## 2023-06-18 DIAGNOSIS — R519 Headache, unspecified: Secondary | ICD-10-CM | POA: Diagnosis not present

## 2023-06-20 ENCOUNTER — Telehealth: Payer: Self-pay | Admitting: Neurology

## 2023-06-20 MED ORDER — ACETAZOLAMIDE ER 500 MG PO CP12: 500.0000 mg | ORAL_CAPSULE | Freq: Two times a day (BID) | ORAL | 5 refills | Status: DC

## 2023-06-20 NOTE — Telephone Encounter (Signed)
Please call patient, lumbar puncture on June 18, 2023 showed elevated opening pressure 35 cm water  Spinal fluid testing showed no significant abnormality  Based on Dr. Bonnita Hollow recommendation with her at office visit, we will start Diamox 500 twice a day,  Meds ordered this encounter  Medications   acetaZOLAMIDE ER (DIAMOX) 500 MG capsule    Sig: Take 1 capsule (500 mg total) by mouth 2 (two) times daily.    Dispense:  60 capsule    Refill:  5          " I discussed with her that I am most concerned about idiopathic intracranial hypertension. It is most common in the 30s and 40s and overweight women. Therefore, we need to have her do a lumbar puncture so opening pressure can be measured. If the opening pressure is elevated, then I recommended that she initiate acetazolamide 500 mg p.o. twice daily. "

## 2023-06-24 ENCOUNTER — Encounter: Payer: Self-pay | Admitting: Internal Medicine

## 2023-06-24 ENCOUNTER — Other Ambulatory Visit: Payer: Self-pay

## 2023-06-24 ENCOUNTER — Ambulatory Visit: Payer: Medicaid Other | Admitting: Internal Medicine

## 2023-06-24 VITALS — BP 132/74 | HR 91 | Temp 98.1°F | Ht 61.0 in | Wt 226.6 lb

## 2023-06-24 DIAGNOSIS — G932 Benign intracranial hypertension: Secondary | ICD-10-CM | POA: Insufficient documentation

## 2023-06-24 DIAGNOSIS — M3219 Other organ or system involvement in systemic lupus erythematosus: Secondary | ICD-10-CM

## 2023-06-24 DIAGNOSIS — Z0000 Encounter for general adult medical examination without abnormal findings: Secondary | ICD-10-CM

## 2023-06-24 DIAGNOSIS — M797 Fibromyalgia: Secondary | ICD-10-CM | POA: Diagnosis not present

## 2023-06-24 NOTE — Assessment & Plan Note (Addendum)
-   Chronic problem and in extreme pain. Has morning stiffness and fibromyalgia. Taking hydroxychloroquine 400 mg. F/u with rheumatologist. Has associated Raynaud and was told by rheumatologist to take 2 tablets of amlodipine 5 mg on days she is having parasthesias. Denies SOB. Occasional chest pain but not concerning. No bowel changes. No diarrhea. Is nauseous due to ICH. Routine labs on 9/11 were normal including CBC, BMP, liver panels, UA and complement levels. Slight decrease in hgb but secondary to SLE. Escalation to methotrexate per rheum if joint pain persist.   - Continue Hydroxychloroquine 200 mg x2 - f/u with rheumatologist

## 2023-06-24 NOTE — Assessment & Plan Note (Addendum)
-   denied flu shot this visit.  - Is getting epidural injection for neck pain today with pain management.

## 2023-06-24 NOTE — Progress Notes (Signed)
Subjective:   Patient ID: Diana Huynh female   DOB: 05/03/1981 42 y.o.   MRN: [id number]  HPI: Diana Huynh is a 42 y.o. with PMH of  Lupus, and recently diagnosed idiopathic intracranial hypertension presenting to clinic for routine follow up. Diana Huynh came with her husband and has significant headaches and stiffness.She denied any recent viral illnesses. Lights were turned off except for cabined fixture lights. Her husband helps her with ADLs including dressing up, bathing and going to her appointments. Please see below for other PMH and details of the visit under assessment and plan.    Past Medical History:  Diagnosis Date   Bronchitis    Fibromyalgia    Lupus (HCC)    Pericarditis    per pt report   Pleuritis 12/31/2017   Current Outpatient Medications  Medication Sig Dispense Refill   acetaZOLAMIDE ER (DIAMOX) 500 MG capsule Take 1 capsule (500 mg total) by mouth 2 (two) times daily. 60 capsule 5   albuterol (VENTOLIN HFA) 108 (90 Base) MCG/ACT inhaler Inhale 1-2 puffs into the lungs every 6 (six) hours as needed for wheezing or shortness of breath. 8 g 0   amLODipine (NORVASC) 5 MG tablet Take 5 mg by mouth daily.     butalbital-acetaminophen-caffeine (FIORICET) 50-325-40 MG tablet Take 1-2 tablets by mouth every 6 (six) hours as needed for headache. 20 tablet 0   cyclobenzaprine (FLEXERIL) 10 MG tablet Take 10 mg by mouth in the morning and at bedtime.     DULoxetine (CYMBALTA) 60 MG capsule Take 60 mg by mouth every evening.     famotidine (PEPCID) 20 MG tablet Take 20 mg by mouth daily.      hydroxychloroquine (PLAQUENIL) 200 MG tablet Take 400 mg by mouth daily.     ibuprofen (ADVIL) 800 MG tablet Take 1 tablet (800 mg total) by mouth 2 (two) times daily as needed. 60 tablet 2   pregabalin (LYRICA) 150 MG capsule Take 150 mg by mouth 2 (two) times daily.     triamcinolone (KENALOG) 0.025 % ointment Apply 1 Application topically 2 (two) times daily. 30 g 0   No current  facility-administered medications for this visit.   Family History  Problem Relation Age of Onset   Hypertension Mother    Heart murmur Mother    Healthy Father    Thyroid disease Sister    Breast cancer Paternal Grandmother    Breast cancer Paternal Aunt    Heart failure Sister        has ICD   Cervical cancer Sister    Social History   Socioeconomic History   Marital status: Married    Spouse name: Not on file   Number of children: Not on file   Years of education: Not on file   Highest education level: Not on file  Occupational History   Not on file  Tobacco Use   Smoking status: Every Day    Current packs/day: 0.00    Types: Cigarettes    Last attempt to quit: 09/20/2018    Years since quitting: 4.7   Smokeless tobacco: Never   Tobacco comments:    4-5 cigs/day  Vaping Use   Vaping status: Former  Substance and Sexual Activity   Alcohol use: Not Currently   Drug use: No   Sexual activity: Yes  Other Topics Concern   Not on file  Social History Narrative   Caffiene coffee 2-4 cups daily.    Working:  none   Husband  with 2 grown kids.    Social Determinants of Health   Financial Resource Strain: Low Risk  (09/06/2022)   Overall Financial Resource Strain (CARDIA)    Difficulty of Paying Living Expenses: Not very hard  Food Insecurity: Food Insecurity Present (04/19/2023)   Hunger Vital Sign    Worried About Running Out of Food in the Last Year: Sometimes true    Ran Out of Food in the Last Year: Sometimes true  Transportation Needs: No Transportation Needs (04/19/2023)   PRAPARE - Administrator, Civil Service (Medical): No    Lack of Transportation (Non-Medical): No  Physical Activity: Not on file  Stress: Not on file  Social Connections: Socially Isolated (09/06/2022)   Social Connection and Isolation Panel [NHANES]    Frequency of Communication with Friends and Family: Never    Frequency of Social Gatherings with Friends and Family: Never     Attends Religious Services: Never    Database administrator or Organizations: No    Attends Engineer, structural: Never    Marital Status: Married   Review of Systems: ROS negative except for what is noted on the assessment and plan.  Objective:  Physical Exam: Vitals:   06/24/23 1005  BP: 132/74  Pulse: 91  Temp: 98.1 F (36.7 C)  TempSrc: Oral  SpO2: 100%  Weight: 226 lb 9.6 oz (102.8 kg)  Height: 5\' 1"  (1.549 m)    General appearance: in pain and uncomfortable; sitting in chair Eyes: anicteric sclerae, moist conjunctivae;  pupils equal and reactive to light; tracking appropriately Neck: no lymphadenopathy, no JVD Lungs: CTAB, no crackles, no wheeze, with normal respiratory effort and no intercostal retractions CV: RRR, S1, S2, no MRGs  Extremities: No peripheral edema, radial and DP pulses present bilaterally; shoulder pain on elevation but no weakness Skin: Normal temperature, turgor and texture; no rash Psych: Appropriate affect Neuro: Alert and oriented to person and place,  CN 2-12 intact; no focal deficits  Assessment & Plan:  Idiopathic intracranial hypertension Recent diagnosis (9/11) by neurologist. Bilateral headaches 9/10 pain. Laying down helps. Bending or looking down worsens. Photophobia. Associated dizziness and an episode of vomiting on Saturday 9/20. No episodes since then but feels nauseous.  Opening pressure of 35 cm water. Was started on Acetazolamide 500 mg BID. Does not think this med has helped. However, was told it might take some time for her pressure to go down. Follows ophthalmologist.    Plan  - continue Acetazolamide 500 mg BID  - follow Neurologist   Fibromyalgia Follows pain management. Takes Cymbalta 60 mg and Lyrica 150 mg bid. Has stiffness and pain secondary to SLE which gets worse in the morning.   - continue Cymbalta and Lyrica  - F/u with pain management   SLE (systemic lupus erythematosus) (HCC) - Chronic problem and in  extreme pain. Has morning stiffness and fibromyalgia. Taking hydroxychloroquine 200 mg. F/u with rheumatologist. Has associated Raynaud and was told by rheumatologist to take 2 tablets of amlodipine 50 mg on days she is having parasthesias. Denies SOB. Occasional chest pain but not concerning. No bowel changes. No diarrhea. Is nauseous due to ICH. Routine labs on 9/11 were normal including CBC, BMP, liver panels, UA and complement levels. Slight decrease in hgb but secondary to SLE. Escalation to methotrexate per rheum if joint pain persist.   - Continue Hydroxychloroquine 200 mg x2 - f/u with rheumatologist    Healthcare maintenance - denied flu shot this visit.  -  Is getting epidural injection for neck pain today with pain management.   Patient discussed with Dr. Oswaldo Done

## 2023-06-24 NOTE — Progress Notes (Deleted)
CC: ***  HPI:  [name] is a 42 y.o. female living with a history stated below and presents today for ***. Please see problem based assessment and plan for additional details.  Past Medical History:  Diagnosis Date   Bronchitis    Fibromyalgia    Lupus (HCC)    Pericarditis    per pt report   Pleuritis 12/31/2017    Current Outpatient Medications on File Prior to Visit  Medication Sig Dispense Refill   acetaZOLAMIDE ER (DIAMOX) 500 MG capsule Take 1 capsule (500 mg total) by mouth 2 (two) times daily. 60 capsule 5   albuterol (VENTOLIN HFA) 108 (90 Base) MCG/ACT inhaler Inhale 1-2 puffs into the lungs every 6 (six) hours as needed for wheezing or shortness of breath. 8 g 0   amLODipine (NORVASC) 5 MG tablet Take 5 mg by mouth daily.     butalbital-acetaminophen-caffeine (FIORICET) 50-325-40 MG tablet Take 1-2 tablets by mouth every 6 (six) hours as needed for headache. 20 tablet 0   cyclobenzaprine (FLEXERIL) 10 MG tablet Take 10 mg by mouth in the morning and at bedtime.     DULoxetine (CYMBALTA) 60 MG capsule Take 60 mg by mouth every evening.     famotidine (PEPCID) 20 MG tablet Take 20 mg by mouth daily.      hydroxychloroquine (PLAQUENIL) 200 MG tablet Take 400 mg by mouth daily.     ibuprofen (ADVIL) 800 MG tablet Take 1 tablet (800 mg total) by mouth 2 (two) times daily as needed. 60 tablet 2   pregabalin (LYRICA) 150 MG capsule Take 150 mg by mouth 2 (two) times daily.     triamcinolone (KENALOG) 0.025 % ointment Apply 1 Application topically 2 (two) times daily. 30 g 0   No current facility-administered medications on file prior to visit.    Family History  Problem Relation Age of Onset   Hypertension Mother    Heart murmur Mother    Healthy Father    Thyroid disease Sister    Breast cancer Paternal Grandmother    Breast cancer Paternal Aunt    Heart failure Sister        has ICD   Cervical cancer Sister     Social History   Socioeconomic History    Marital status: Married    Spouse name: Not on file   Number of children: Not on file   Years of education: Not on file   Highest education level: Not on file  Occupational History   Not on file  Tobacco Use   Smoking status: Every Day    Current packs/day: 0.00    Types: Cigarettes    Last attempt to quit: 09/20/2018    Years since quitting: 4.7   Smokeless tobacco: Never   Tobacco comments:    4-5 cigs/day  Vaping Use   Vaping status: Former  Substance and Sexual Activity   Alcohol use: Not Currently   Drug use: No   Sexual activity: Yes  Other Topics Concern   Not on file  Social History Narrative   Caffiene coffee 2-4 cups daily.    Working:  none   Husband  with 2 grown kids.    Social Determinants of Health   Financial Resource Strain: Low Risk  (09/06/2022)   Overall Financial Resource Strain (CARDIA)    Difficulty of Paying Living Expenses: Not very hard  Food Insecurity: Food Insecurity Present (04/19/2023)   Hunger Vital Sign    Worried About Running Out  of Food in the Last Year: Sometimes true    Ran Out of Food in the Last Year: Sometimes true  Transportation Needs: No Transportation Needs (04/19/2023)   PRAPARE - Administrator, Civil Service (Medical): No    Lack of Transportation (Non-Medical): No  Physical Activity: Not on file  Stress: Not on file  Social Connections: Socially Isolated (09/06/2022)   Social Connection and Isolation Panel [NHANES]    Frequency of Communication with Friends and Family: Never    Frequency of Social Gatherings with Friends and Family: Never    Attends Religious Services: Never    Database administrator or Organizations: No    Attends Banker Meetings: Never    Marital Status: Married  Catering manager Violence: Not At Risk (09/06/2022)   Humiliation, Afraid, Rape, and Kick questionnaire    Fear of Current or Ex-Partner: No    Emotionally Abused: No    Physically Abused: No    Sexually Abused: No     Review of Systems: ROS negative except for what is noted on the assessment and plan.  Vitals:   06/24/23 1005  BP: 132/74  Pulse: 91  Temp: 98.1 F (36.7 C)  TempSrc: Oral  SpO2: 100%  Weight: 226 lb 9.6 oz (102.8 kg)  Height: 5\' 1"  (1.549 m)    Physical Exam: Constitutional: well-appearing *** sitting in ***, in no acute distress HENT: normocephalic atraumatic, mucous membranes moist Eyes: conjunctiva non-erythematous Cardiovascular: regular rate and rhythm, no m/r/g Pulmonary/Chest: normal work of breathing on room air, lungs clear to auscultation bilaterally Abdominal: soft, non-tender, non-distended MSK: normal bulk and tone Neurological: alert & oriented x 3, no focal deficit Skin: warm and dry Psych: normal mood and behavior  Assessment & Plan:   HCM: flu vax  Mammary duct ectasia of left breast: follow up ultrasound scheduled in January 2025  Lupus: hydroxychloroquine 400 mg daily - ophtho? rheuma - escalation to methotrexate per rheum if joint pain persists  Idiopathic intracranial HTN: - LP 9/11 with elevated opening pressure - Acetazolamide 500 mg bid - headache, nausea  - takes ibuprofen everyday  Anemia: Hb 11.8 - iron studies    Fibromyalgia Cymbalta 60 mg Lyrica 150 mg bid Pain maangement   Neck pain: - injection today  Patient {GC/[id number]::"discussed with","seen with"} Dr. {[id number]::"FAOZHYQM","V. Hoffman","Mullen","Narendra","Vincent","Guilloud","Lau","Machen"}  No problem-specific Assessment & Plan notes found for this encounter.   Elza Rafter, D.O. [facility] Internal Medicine, PGY-3 Phone: [phone] Date 06/24/2023 Time 10:44 AM

## 2023-06-24 NOTE — Assessment & Plan Note (Addendum)
Follows pain management. Takes Cymbalta 60 mg and Lyrica 150 mg bid. Has stiffness and pain secondary to SLE which gets worse in the morning.   - continue Cymbalta and Lyrica  - F/u with pain management

## 2023-06-24 NOTE — Patient Instructions (Addendum)
Thank you, Ms.Andria Hulet for allowing [place] to provide your care today. Today we discussed the following. Headaches - Please continue Acetazolamide 500 mg two times a day as prescribed by your neurologist. Pain - continue  Lyrica and Cymbalta.  Please follow up with your other specialists. If you notice worsening headaches, vision changes/loss, jaw pain or nausea and vomiting. Please go to the ED.  Follow up in 3-4 months.   Follow up: 3-4 months   Remember:   We look forward to seeing you next time. Please call our clinic at [phone] if you have any questions or concerns. The [place] time to call is Monday-Friday from 9am-4pm, but there is someone available 24/7. If after hours or the weekend, call the main hospital number and ask for the Internal Medicine Resident On-Call. If you need medication refills, please notify your pharmacy one week in advance and they will send [place] a request.   Thank you for trusting me with your care. Wishing you the [facility]

## 2023-06-24 NOTE — Assessment & Plan Note (Addendum)
Recent diagnosis (9/11) by neurologist. Bilateral headaches 9/10 pain. Laying down helps. Bending or looking down worsens. Photophobia. Associated dizziness and an episode of vomiting on Saturday 9/20. No episodes since then but feels nauseous.  Opening pressure of 35 cm water. Was started on Acetazolamide 500 mg BID. Does not think this med has helped. However, was told it might take some time for her pressure to go down. Follows ophthalmologist.    Plan  - continue Acetazolamide 500 mg BID  - follow Neurologist

## 2023-06-25 NOTE — Progress Notes (Signed)
 Internal Medicine Clinic Attending  Case discussed with the resident physician at the time of the visit.  We reviewed the patient's history, exam, and pertinent patient test results.  I agree with the assessment, diagnosis, and plan of care documented in the resident's note.

## 2023-06-26 NOTE — Telephone Encounter (Signed)
Per Dr.Sater results note:  " Please double check that the patient got the phone message that the spinal fluid pressure was elevated and she needs to start acetazolamide 500 mg twice a day.  Dr. Terrace Arabia sent the prescription into the pharmacy last week. "   Pt did read my chart message "Last read by [name] "Diana Huynh" at 12:32 PM on 06/20/2023."  I called and left message for patient to call and confirm per Dr. Epimenio Foot request.

## 2023-06-27 LAB — CSF CELL COUNT WITH DIFFERENTIAL
RBC Count, CSF: 1 cells/uL — ABNORMAL HIGH
TOTAL NUCLEATED CELL: 0 cells/uL (ref 0–5)

## 2023-06-27 LAB — GLUCOSE, CSF: Glucose, CSF: 70 mg/dL (ref 40–80)

## 2023-06-27 LAB — PROTEIN, CSF: Total Protein, CSF: 33 mg/dL (ref 15–45)

## 2023-06-27 NOTE — Telephone Encounter (Signed)
Per Dr. Macon Large, patient can discontinue Gabapentin and come to office for follow up if needed.   Called and spoke with patient and she is off the Gabapentin and is not just on Lyrica. Offered for patient to have follow up appointment with Dr. Macon Large to discuss alternatives for her GYN concerns. Patient declined at this time.

## 2023-07-03 NOTE — Telephone Encounter (Signed)
Pt called back and she was aware of the below and taking acetazolamide 500 mg twice a day.

## 2023-07-19 DIAGNOSIS — M542 Cervicalgia: Secondary | ICD-10-CM | POA: Diagnosis not present

## 2023-07-26 ENCOUNTER — Ambulatory Visit: Payer: Medicaid Other | Admitting: Diagnostic Neuroimaging

## 2023-08-14 ENCOUNTER — Other Ambulatory Visit: Payer: Self-pay | Admitting: Student

## 2023-08-14 DIAGNOSIS — M328 Other forms of systemic lupus erythematosus: Secondary | ICD-10-CM | POA: Diagnosis not present

## 2023-08-14 DIAGNOSIS — G932 Benign intracranial hypertension: Secondary | ICD-10-CM | POA: Diagnosis not present

## 2023-08-14 DIAGNOSIS — I7300 Raynaud's syndrome without gangrene: Secondary | ICD-10-CM | POA: Diagnosis not present

## 2023-08-14 DIAGNOSIS — Z79899 Other long term (current) drug therapy: Secondary | ICD-10-CM | POA: Diagnosis not present

## 2023-08-14 DIAGNOSIS — M797 Fibromyalgia: Secondary | ICD-10-CM | POA: Diagnosis not present

## 2023-08-14 DIAGNOSIS — Z7952 Long term (current) use of systemic steroids: Secondary | ICD-10-CM | POA: Diagnosis not present

## 2023-08-14 DIAGNOSIS — M4802 Spinal stenosis, cervical region: Secondary | ICD-10-CM | POA: Diagnosis not present

## 2023-08-14 DIAGNOSIS — Z79620 Long term (current) use of immunosuppressive biologic: Secondary | ICD-10-CM | POA: Diagnosis not present

## 2023-08-14 DIAGNOSIS — H4710 Unspecified papilledema: Secondary | ICD-10-CM | POA: Diagnosis not present

## 2023-08-15 DIAGNOSIS — Z79899 Other long term (current) drug therapy: Secondary | ICD-10-CM | POA: Diagnosis not present

## 2023-08-15 DIAGNOSIS — Z79620 Long term (current) use of immunosuppressive biologic: Secondary | ICD-10-CM | POA: Diagnosis not present

## 2023-08-15 DIAGNOSIS — M328 Other forms of systemic lupus erythematosus: Secondary | ICD-10-CM | POA: Diagnosis not present

## 2023-08-16 NOTE — Telephone Encounter (Signed)
Pt states the pharmacy has the refill but Dr. Elaina Pattee is not coming up. Pt would like to know if a call can be made to her pharmacy to correct and fill the following medication.  ibuprofen (ADVIL) 800 MG tablet   [facility] [id number] - [place], [place] - [address]

## 2023-09-05 DIAGNOSIS — G894 Chronic pain syndrome: Secondary | ICD-10-CM | POA: Diagnosis not present

## 2023-09-05 DIAGNOSIS — M7918 Myalgia, other site: Secondary | ICD-10-CM | POA: Diagnosis not present

## 2023-09-05 DIAGNOSIS — M4802 Spinal stenosis, cervical region: Secondary | ICD-10-CM | POA: Diagnosis not present

## 2023-09-09 ENCOUNTER — Other Ambulatory Visit: Payer: Self-pay | Admitting: Neurology

## 2023-09-11 DIAGNOSIS — Z79620 Long term (current) use of immunosuppressive biologic: Secondary | ICD-10-CM | POA: Diagnosis not present

## 2023-09-11 DIAGNOSIS — M3219 Other organ or system involvement in systemic lupus erythematosus: Secondary | ICD-10-CM | POA: Diagnosis not present

## 2023-09-11 DIAGNOSIS — G932 Benign intracranial hypertension: Secondary | ICD-10-CM | POA: Diagnosis not present

## 2023-09-11 DIAGNOSIS — I7300 Raynaud's syndrome without gangrene: Secondary | ICD-10-CM | POA: Diagnosis not present

## 2023-09-11 DIAGNOSIS — M797 Fibromyalgia: Secondary | ICD-10-CM | POA: Diagnosis not present

## 2023-09-11 DIAGNOSIS — Z79899 Other long term (current) drug therapy: Secondary | ICD-10-CM | POA: Diagnosis not present

## 2023-09-18 DIAGNOSIS — M3219 Other organ or system involvement in systemic lupus erythematosus: Secondary | ICD-10-CM | POA: Diagnosis not present

## 2023-09-24 MED ORDER — BUTALBITAL-APAP-CAFFEINE 50-325-40 MG PO TABS: 1.0000 | ORAL_TABLET | Freq: Four times a day (QID) | ORAL | 0 refills | Status: DC | PRN

## 2023-09-24 NOTE — Telephone Encounter (Signed)
Requested Prescriptions   Pending Prescriptions Disp Refills   butalbital-acetaminophen-caffeine (FIORICET) 50-325-40 MG tablet 20 tablet 0    Sig: Take 1-2 tablets by mouth every 6 (six) hours as needed for headache.   Last seen 06/11/23, next appt 12/18/23  Dispenses   Dispensed Days Supply Quantity Provider Pharmacy  BUT/APAP/CAF TAB 06/11/2023 2 20 tablet Sater, Pearletha Furl, MD [facility]...  BUT/APAP/CAF TAB 05/09/2023 2 20 tablet Glyn Ade, MD [facility]...

## 2023-10-15 ENCOUNTER — Other Ambulatory Visit: Payer: Self-pay | Admitting: Internal Medicine

## 2023-10-15 ENCOUNTER — Ambulatory Visit
Admission: RE | Admit: 2023-10-15 | Discharge: 2023-10-15 | Disposition: A | Discharge status: Home / Self Care | Payer: Medicaid Other | Source: Ambulatory Visit | Attending: Internal Medicine

## 2023-10-15 DIAGNOSIS — N6320 Unspecified lump in the left breast, unspecified quadrant: Secondary | ICD-10-CM

## 2023-10-15 DIAGNOSIS — R928 Other abnormal and inconclusive findings on diagnostic imaging of breast: Secondary | ICD-10-CM

## 2023-10-15 DIAGNOSIS — N6325 Unspecified lump in the left breast, overlapping quadrants: Secondary | ICD-10-CM | POA: Diagnosis not present

## 2023-10-16 DIAGNOSIS — M3219 Other organ or system involvement in systemic lupus erythematosus: Secondary | ICD-10-CM | POA: Diagnosis not present

## 2023-10-17 DIAGNOSIS — M4802 Spinal stenosis, cervical region: Secondary | ICD-10-CM | POA: Diagnosis not present

## 2023-10-17 DIAGNOSIS — M7918 Myalgia, other site: Secondary | ICD-10-CM | POA: Diagnosis not present

## 2023-10-17 DIAGNOSIS — G894 Chronic pain syndrome: Secondary | ICD-10-CM | POA: Diagnosis not present

## 2023-10-17 DIAGNOSIS — M542 Cervicalgia: Secondary | ICD-10-CM | POA: Diagnosis not present

## 2023-10-23 NOTE — Progress Notes (Signed)
Patient aware she needs to have bilateral diagnostic mammo in July 2025.

## 2023-11-05 DIAGNOSIS — Z0271 Encounter for disability determination: Secondary | ICD-10-CM

## 2023-11-13 DIAGNOSIS — M3219 Other organ or system involvement in systemic lupus erythematosus: Secondary | ICD-10-CM | POA: Diagnosis not present

## 2023-12-04 ENCOUNTER — Ambulatory Visit: Admitting: Internal Medicine

## 2023-12-04 VITALS — BP 118/63 | HR 80 | Temp 97.8°F | Wt 230.7 lb

## 2023-12-04 DIAGNOSIS — M797 Fibromyalgia: Secondary | ICD-10-CM | POA: Diagnosis not present

## 2023-12-04 DIAGNOSIS — G932 Benign intracranial hypertension: Secondary | ICD-10-CM

## 2023-12-04 DIAGNOSIS — M329 Systemic lupus erythematosus, unspecified: Secondary | ICD-10-CM | POA: Diagnosis not present

## 2023-12-04 DIAGNOSIS — I7300 Raynaud's syndrome without gangrene: Secondary | ICD-10-CM | POA: Insufficient documentation

## 2023-12-04 DIAGNOSIS — M3219 Other organ or system involvement in systemic lupus erythematosus: Secondary | ICD-10-CM

## 2023-12-04 MED ORDER — IBUPROFEN 800 MG PO TABS: 800.0000 mg | ORAL_TABLET | Freq: Two times a day (BID) | ORAL | 1 refills | Status: DC | PRN

## 2023-12-04 NOTE — Assessment & Plan Note (Signed)
 Patient has a history of systemic lupus erythematosus.  She has morning stiffness in her joints and fibromyalgia.  She takes hydroxychloroquine 400 mg daily and started on saphnelo infusions every 4 weeks.  She states that she feels like these infusions are not helping her as much as it used to.  She is following up with rheumatology this month.

## 2023-12-04 NOTE — Patient Instructions (Signed)
 Thank you, Ms.Allanah Mcgath for allowing [place] to provide your care today. Today we discussed:  Fibromyalgia/raynauds/lupus Keep taking amlodipine, hydroxychloroquine, and your pain medicines as prescribed by rheumatology and pain management I have refilled your ibuprofen - remember to not take it everyday as it can cause stomach ulcers   Follow up with neurology Keep taking acetazolamide 500 mg twice a day  I have ordered the following labs for you:  Lab Orders  No laboratory test(s) ordered today      Referrals ordered today:   Referral Orders  No referral(s) requested today     I have ordered the following medication/changed the following medications:   Stop the following medications: Medications Discontinued During This Encounter  Medication Reason   ibuprofen (ADVIL) 800 MG tablet Reorder     Start the following medications: Meds ordered this encounter  Medications   ibuprofen (ADVIL) 800 MG tablet    Sig: Take 1 tablet (800 mg total) by mouth 2 (two) times daily as needed.    Dispense:  60 tablet    Refill:  1     Follow up: 6 months or sooner if needed  Should you have any questions or concerns please call the internal medicine clinic at [phone].     Elza Rafter, D.O. [facility]

## 2023-12-04 NOTE — Assessment & Plan Note (Addendum)
 The patient follows with pain management.  She takes Cymbalta 60 mg daily, Lyrica 150 mg 3 times daily, Flexeril 10 mg 3 times daily, Voltaren gel as needed, and ibuprofen 800 mg as needed.  Refilled ibuprofen today.

## 2023-12-04 NOTE — Assessment & Plan Note (Signed)
 The patient has a history of idiopathic intracranial hypertension and has been following with neurology.  She is on acetazolamide 500 mg twice daily and her symptoms have been well-controlled.

## 2023-12-04 NOTE — Assessment & Plan Note (Signed)
 In addition to lupus, the patient has a history of Raynaud's disease.  She takes amlodipine 10 mg daily in the cold/winter months, but otherwise usually takes 5 mg daily.  Continue to follow with rheumatology.

## 2023-12-04 NOTE — Progress Notes (Signed)
 CC: follow up  HPI:  Diana Huynh is a 43 y.o. female living with a history stated below and presents today for a routine follow up. Please see problem based assessment and plan for additional details.  Past Medical History:  Diagnosis Date   Bronchitis    Fibromyalgia    Lupus (HCC)    Pericarditis    per pt report   Pleuritis 12/31/2017    Current Outpatient Medications on File Prior to Visit  Medication Sig Dispense Refill   acetaZOLAMIDE ER (DIAMOX) 500 MG capsule Take 1 capsule (500 mg total) by mouth 2 (two) times daily. 60 capsule 5   albuterol (VENTOLIN HFA) 108 (90 Base) MCG/ACT inhaler Inhale 1-2 puffs into the lungs every 6 (six) hours as needed for wheezing or shortness of breath. 8 g 0   amLODipine (NORVASC) 5 MG tablet Take 5 mg by mouth daily.     butalbital-acetaminophen-caffeine (FIORICET) 50-325-40 MG tablet Take 1-2 tablets by mouth every 6 (six) hours as needed for headache. 20 tablet 0   cyclobenzaprine (FLEXERIL) 10 MG tablet Take 10 mg by mouth in the morning and at bedtime.     DULoxetine (CYMBALTA) 60 MG capsule Take 60 mg by mouth every evening.     famotidine (PEPCID) 20 MG tablet Take 20 mg by mouth daily.      hydroxychloroquine (PLAQUENIL) 200 MG tablet Take 400 mg by mouth daily.     pregabalin (LYRICA) 150 MG capsule Take 150 mg by mouth 2 (two) times daily.     triamcinolone (KENALOG) 0.025 % ointment Apply 1 Application topically 2 (two) times daily. 30 g 0   No current facility-administered medications on file prior to visit.    Family History  Problem Relation Age of Onset   Hypertension Mother    Heart murmur Mother    Healthy Father    Thyroid disease Sister    Breast cancer Paternal Grandmother    Breast cancer Paternal Aunt    Heart failure Sister        has ICD   Cervical cancer Sister     Social History   Socioeconomic History   Marital status: Married    Spouse name: Not on file   Number of children: Not on file    Years of education: Not on file   Highest education level: Not on file  Occupational History   Not on file  Tobacco Use   Smoking status: Every Day    Current packs/day: 0.00    Types: Cigarettes    Last attempt to quit: 09/20/2018    Years since quitting: 5.2   Smokeless tobacco: Never   Tobacco comments:    4-5 cigs/day  Vaping Use   Vaping status: Former  Substance and Sexual Activity   Alcohol use: Not Currently   Drug use: No   Sexual activity: Yes  Other Topics Concern   Not on file  Social History Narrative   Caffiene coffee 2-4 cups daily.    Working:  none   Husband  with 2 grown kids.    Social Drivers of Corporate investment banker Strain: Low Risk  (09/06/2022)   Overall Financial Resource Strain (CARDIA)    Difficulty of Paying Living Expenses: Not very hard  Food Insecurity: Food Insecurity Present (04/19/2023)   Hunger Vital Sign    Worried About Running Out of Food in the Last Year: Sometimes true    Ran Out of Food in the Last Year: Sometimes  true  Transportation Needs: No Transportation Needs (04/19/2023)   PRAPARE - Administrator, Civil Service (Medical): No    Lack of Transportation (Non-Medical): No  Physical Activity: Not on file  Stress: Not on file  Social Connections: Socially Isolated (09/06/2022)   Social Connection and Isolation Panel [NHANES]    Frequency of Communication with Friends and Family: Never    Frequency of Social Gatherings with Friends and Family: Never    Attends Religious Services: Never    Database administrator or Organizations: No    Attends Banker Meetings: Never    Marital Status: Married  Catering manager Violence: Not At Risk (09/06/2022)   Humiliation, Afraid, Rape, and Kick questionnaire    Fear of Current or Ex-Partner: No    Emotionally Abused: No    Physically Abused: No    Sexually Abused: No    Review of Systems: ROS negative except for what is noted on the assessment and  plan.  Vitals:   12/04/23 0926  BP: 118/63  Pulse: 80  Temp: 97.8 F (36.6 C)  TempSrc: Oral  SpO2: 92%  Weight: 230 lb 11.2 oz (104.6 kg)    Physical Exam: Constitutional: appears well, no acute distress Cardiovascular: regular rate and rhythm, no m/r/g Pulmonary/Chest: normal work of breathing on room air MSK: normal bulk and tone Skin: warm and dry Psych: normal mood and behavior  Assessment & Plan:   Patient discussed with Dr. Cleda Daub  Idiopathic intracranial hypertension The patient has a history of idiopathic intracranial hypertension and has been following with neurology.  She is on acetazolamide 500 mg twice daily and her symptoms have been well-controlled.  Fibromyalgia The patient follows with pain management.  She takes Cymbalta 60 mg daily, Lyrica 150 mg 3 times daily, Flexeril 10 mg 3 times daily, Voltaren gel as needed, and ibuprofen 800 mg as needed.  Refilled ibuprofen today.  SLE (systemic lupus erythematosus) (HCC) Patient has a history of systemic lupus erythematosus.  She has morning stiffness in her joints and fibromyalgia.  She takes hydroxychloroquine 400 mg daily and started on saphnelo infusions every 4 weeks.  She states that she feels like these infusions are not helping her as much as it used to.  She is following up with rheumatology this month.  Raynaud disease In addition to lupus, the patient has a history of Raynaud's disease.  She takes amlodipine 10 mg daily in the cold/winter months, but otherwise usually takes 5 mg daily.  Continue to follow with rheumatology.   Elza Rafter, D.O. [facility] Internal Medicine, PGY-3 Phone: [phone] Date 12/04/2023 Time 1:36 PM

## 2023-12-10 NOTE — Progress Notes (Signed)
 Internal Medicine Clinic Attending  Case discussed with the resident at the time of the visit.  We reviewed the resident's history and exam and pertinent patient test results.  I agree with the assessment, diagnosis, and plan of care documented in the resident's note.

## 2023-12-11 ENCOUNTER — Telehealth: Payer: Self-pay | Admitting: *Deleted

## 2023-12-11 DIAGNOSIS — M3219 Other organ or system involvement in systemic lupus erythematosus: Secondary | ICD-10-CM | POA: Diagnosis not present

## 2023-12-11 NOTE — Telephone Encounter (Signed)
 Appointment July 15.2025 @ 8:00 am to arrive 7:40 am. / patient aware of the 75.00 no show fee charge to call 24 hour prior to cancel or reschedule appointment. Appointment mailed to patient.

## 2023-12-17 DIAGNOSIS — G8929 Other chronic pain: Secondary | ICD-10-CM | POA: Diagnosis not present

## 2023-12-17 DIAGNOSIS — M328 Other forms of systemic lupus erythematosus: Secondary | ICD-10-CM | POA: Diagnosis not present

## 2023-12-17 DIAGNOSIS — M7918 Myalgia, other site: Secondary | ICD-10-CM | POA: Diagnosis not present

## 2023-12-18 ENCOUNTER — Ambulatory Visit: Payer: Medicaid Other | Admitting: Neurology

## 2023-12-18 ENCOUNTER — Encounter: Payer: Self-pay | Admitting: Neurology

## 2023-12-18 VITALS — BP 121/74 | HR 97 | Ht 61.0 in | Wt 228.0 lb

## 2023-12-18 DIAGNOSIS — M3219 Other organ or system involvement in systemic lupus erythematosus: Secondary | ICD-10-CM | POA: Diagnosis not present

## 2023-12-18 DIAGNOSIS — G8929 Other chronic pain: Secondary | ICD-10-CM | POA: Diagnosis not present

## 2023-12-18 DIAGNOSIS — H4710 Unspecified papilledema: Secondary | ICD-10-CM

## 2023-12-18 DIAGNOSIS — G932 Benign intracranial hypertension: Secondary | ICD-10-CM | POA: Diagnosis not present

## 2023-12-18 DIAGNOSIS — R519 Headache, unspecified: Secondary | ICD-10-CM | POA: Diagnosis not present

## 2023-12-18 DIAGNOSIS — M797 Fibromyalgia: Secondary | ICD-10-CM | POA: Diagnosis not present

## 2023-12-18 DIAGNOSIS — I7300 Raynaud's syndrome without gangrene: Secondary | ICD-10-CM | POA: Diagnosis not present

## 2023-12-18 DIAGNOSIS — H5340 Unspecified visual field defects: Secondary | ICD-10-CM

## 2023-12-18 DIAGNOSIS — M328 Other forms of systemic lupus erythematosus: Secondary | ICD-10-CM | POA: Diagnosis not present

## 2023-12-18 DIAGNOSIS — Z79899 Other long term (current) drug therapy: Secondary | ICD-10-CM | POA: Diagnosis not present

## 2023-12-18 DIAGNOSIS — Z79620 Long term (current) use of immunosuppressive biologic: Secondary | ICD-10-CM | POA: Diagnosis not present

## 2023-12-18 MED ORDER — ACETAZOLAMIDE ER 500 MG PO CP12: 500.0000 mg | ORAL_CAPSULE | Freq: Two times a day (BID) | ORAL | 3 refills | Status: AC

## 2023-12-18 NOTE — Progress Notes (Signed)
 [facility]  PATIENT: Diana Huynh DOB: 25-Jun-1981  REFERRING DOCTOR OR PCP: Glyn Ade, MD; Elza Rafter, DO; Georges Mouse, MD SOURCE: Patient, notes from ophthalmology, imaging and lab reports, MRI images personally reviewed.  _________________________________   HISTORICAL  CHIEF COMPLAINT:  Chief Complaint  Patient presents with   Follow-up    RM10, HUSBAND PRESENT,  Optic nerve edema headache type Visual field defect:reported off and on blurry vision, ha's: 4/30 days, trigger:blurry vision    HISTORY OF PRESENT ILLNESS:  Diana Huynh is a 43 y.o. 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   She sees Dr. Sherryll Burger [facility]).  She saw him on Monday.    Compared to the last visit, she feels her headaches are doing better.  She has not noticing any pulsatile tinnitus.  She feels vision is doing about the same as the last visit.  The left eye is worse on the right.   She remains on Diamox 500 mg twice a day.   She has tolerated fairly well.  Notes from Dr. Sherryll Burger were reviewed.  Visual fields were similar to the last visit.  OCT was read as normal on the right and just slightly abnormal on the left.   History of IIH She noted headaches and visual blurring around April 2024.  Initially, symptoms were mild but she felt that they worsened in May and June.Marland Kitchen  She wakes up with a bad HA since June 2024.  Headaches are bilateral with a pounding quality.  Initially she was felt to be having a lupus flare.   She saw optometry for her annular Plaquenil exam (to assess for macular edema) and then was referred to Dr. Georges Mouse (Ophthalmology) due to bilateral optic disc edema.   This exam confirmed the optic disc edema.  Additional testing showed she had reduced visual fields bilaterally.   VA was 20/25-1  each eye.   IOP was normal.      She went to the ED for further evaluation.  She had MRI/MRV that were normal.  Of note, no empty sella is noted.   Optic nerve sheaths are normal diameter.   No thrombosis noted.  She was given a prescription for butalbital.   She feels that the butalbital has helped the headaches.  She had an LP and OP was elevated at 35 cm.   Diamox 500 mg po bid was started.     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    She sees Dr. Letta Pate at [facility].     She sees Pain Management in [facility] (Dr. Anthonette Legato) and reports she will be having a cervical ESI.    She snores but has no witnessed OSA.      Imaging: MRI of the brain 05/08/2023 was normal.  MRI of the orbits 05/08/2023 was normal.  MR venogram of the head 05/08/2023 showed mild asymmetry of the transverse sinuses but there did not appear to be any thrombosis.  MRI cervical spine 101/2022 showed disc protrusion at C4-C5 with resultant mild spinal stenosis.   Milder DDD at [facility] and C5C6.   Foci in parotid glands were also seen.      REVIEW OF SYSTEMS: Constitutional: No fevers, chills, sweats, or change in appetite Eyes: As above\ Ear, nose and  throat: No hearing loss, ear pain, nasal congestion, sore throat Cardiovascular: No chest pain, palpitations Respiratory:  No shortness of breath at rest or with exertion.   No wheezes GastrointestinaI: No nausea, vomiting, diarrhea, abdominal pain, fecal incontinence Genitourinary:  No dysuria, urinary retention or frequency.  No nocturia. Musculoskeletal: She reports a lot of joint pain and muscle pain Integumentary: No rash, pruritus, skin lesions Neurological: as above Psychiatric: No depression at this time.  No anxiety Endocrine: No palpitations, diaphoresis, change in appetite, change in weigh or increased thirst Hematologic/Lymphatic:  No anemia, purpura, petechiae. Allergic/Immunologic: No itchy/runny eyes, nasal  congestion, recent allergic reactions, rashes  ALLERGIES: No Known Allergies  HOME MEDICATIONS:  Current Outpatient Medications:    albuterol (VENTOLIN HFA) 108 (90 Base) MCG/ACT inhaler, Inhale 1-2 puffs into the lungs every 6 (six) hours as needed for wheezing or shortness of breath., Disp: 8 g, Rfl: 0   amLODipine (NORVASC) 5 MG tablet, Take 5 mg by mouth daily., Disp: , Rfl:    butalbital-acetaminophen-caffeine (FIORICET) 50-325-40 MG tablet, Take 1-2 tablets by mouth every 6 (six) hours as needed for headache., Disp: 20 tablet, Rfl: 0   DULoxetine (CYMBALTA) 60 MG capsule, Take 60 mg by mouth every evening., Disp: , Rfl:    famotidine (PEPCID) 20 MG tablet, Take 20 mg by mouth daily. , Disp: , Rfl:    hydroxychloroquine (PLAQUENIL) 200 MG tablet, Take 400 mg by mouth daily., Disp: , Rfl:    ibuprofen (ADVIL) 800 MG tablet, Take 1 tablet (800 mg total) by mouth 2 (two) times daily as needed., Disp: 60 tablet, Rfl: 1   pregabalin (LYRICA) 150 MG capsule, Take 150 mg by mouth 2 (two) times daily., Disp: , Rfl:    tiZANidine (ZANAFLEX) 4 MG tablet, Take 4 mg by mouth 2 (two) times daily., Disp: , Rfl:    triamcinolone (KENALOG) 0.025 % ointment, Apply 1 Application topically 2 (two) times daily., Disp: 30 g, Rfl: 0   acetaZOLAMIDE ER (DIAMOX) 500 MG capsule, Take 1 capsule (500 mg total) by mouth 2 (two) times daily., Disp: 180 capsule, Rfl: 3  PAST MEDICAL HISTORY: Past Medical History:  Diagnosis Date   Bronchitis    Fibromyalgia    Lupus    Pericarditis    per pt report   Pleuritis 12/31/2017    PAST SURGICAL HISTORY: Past Surgical History:  Procedure Laterality Date   ANKLE SURGERY     APPENDECTOMY     CESAREAN SECTION      FAMILY HISTORY: Family History  Problem Relation Age of Onset   Hypertension Mother    Heart murmur Mother    Healthy Father    Thyroid disease Sister    Breast cancer Paternal Grandmother    Breast cancer Paternal Aunt    Heart failure Sister         has ICD   Cervical cancer Sister     SOCIAL HISTORY: Social History   Socioeconomic History   Marital status: Married    Spouse name: Not on file   Number of children: Not on file   Years of education: Not on file   Highest education level: Not on file  Occupational History   Not on file  Tobacco Use   Smoking status: Every Day    Current packs/day: 0.00    Types: Cigarettes    Last attempt to quit: 09/20/2018    Years since quitting: 5.2   Smokeless tobacco: Never   Tobacco comments:    4-5  cigs/day  Vaping Use   Vaping status: Former  Substance and Sexual Activity   Alcohol use: Not Currently   Drug use: No   Sexual activity: Yes  Other Topics Concern   Not on file  Social History Narrative   Caffiene coffee 2-4 cups daily.    Working:  none   Husband  with 2 grown kids.    Social Drivers of Corporate investment banker Strain: Low Risk  (09/06/2022)   Overall Financial Resource Strain (CARDIA)    Difficulty of Paying Living Expenses: Not very hard  Food Insecurity: Food Insecurity Present (04/19/2023)   Hunger Vital Sign    Worried About Running Out of Food in the Last Year: Sometimes true    Ran Out of Food in the Last Year: Sometimes true  Transportation Needs: No Transportation Needs (04/19/2023)   PRAPARE - Administrator, Civil Service (Medical): No    Lack of Transportation (Non-Medical): No  Physical Activity: Not on file  Stress: Not on file  Social Connections: Socially Isolated (09/06/2022)   Social Connection and Isolation Panel [NHANES]    Frequency of Communication with Friends and Family: Never    Frequency of Social Gatherings with Friends and Family: Never    Attends Religious Services: Never    Database administrator or Organizations: No    Attends Banker Meetings: Never    Marital Status: Married  Catering manager Violence: Not At Risk (09/06/2022)   Humiliation, Afraid, Rape, and Kick questionnaire    Fear  of Current or Ex-Partner: No    Emotionally Abused: No    Physically Abused: No    Sexually Abused: No       PHYSICAL EXAM  Vitals:   12/18/23 1343  BP: 121/74  Pulse: 97  Weight: 228 lb (103.4 kg)  Height: 5\' 1"  (1.549 m)    Body mass index is 43.08 kg/m.   General: The patient is well-developed and well-nourished and in no acute distress  HEENT:  Head is [place]/AT.  Sclera are anicteric.  Funduscopic examination was normal on the right.  On the left there was very minimal blurring of the disc margin medially  Skin: Extremities are without rash or  edema.  Musculoskeletal:  Back is nontender  Neurologic Exam  Mental status: The patient is alert and oriented x 3 at the time of the examination. The patient has apparent normal recent and remote memory, with an apparently normal attention span and concentration ability.   Speech is normal.  Cranial nerves: Extraocular movements are full. Pupils are equal, round, and reactive to light and accomodation.      There is good facial sensation to soft touch bilaterally.Facial strength is normal.   . No obvious hearing deficits are noted.  Motor:  Muscle bulk is normal.   Tone is normal. Strength is  5 / 5 in all 4 extremities.   Sensory: Sensory testing is intact to pinprick, soft touch and vibration sensation in all 4 extremities.  Coordination: Cerebellar testing reveals good finger-nose-finger and heel-to-shin bilaterally.  Gait and station: Station is normal.   Gait is arthritic . Tandem gait is mildly wide. Romberg is negative.   Reflexes: Deep tendon reflexes are symmetric and normal bilaterally.       DIAGNOSTIC DATA (LABS, IMAGING, TESTING) - I reviewed patient records, labs, notes, testing and imaging myself where available.  Lab Results  Component Value Date   WBC 5.3 05/08/2023   HGB 11.0 (  L) 05/08/2023   HCT 34.2 (L) 05/08/2023   MCV 83.4 05/08/2023   PLT 243 05/08/2023      Component Value Date/Time   NA  138 05/08/2023 1703   NA 137 05/12/2020 1727   K 3.6 05/08/2023 1703   CL 105 05/08/2023 1703   CO2 24 05/08/2023 1703   GLUCOSE 104 (H) 05/08/2023 1703   BUN 10 05/08/2023 1703   BUN 9 05/12/2020 1727   CREATININE 0.91 05/08/2023 1703   CALCIUM 9.0 05/08/2023 1703   PROT 8.4 (H) 05/08/2023 1703   PROT 7.1 01/30/2018 1042   ALBUMIN 4.4 05/08/2023 1703   ALBUMIN 4.4 01/30/2018 1042   AST 20 05/08/2023 1703   ALT 18 05/08/2023 1703   ALKPHOS 47 05/08/2023 1703   BILITOT 0.4 05/08/2023 1703   BILITOT <0.2 01/30/2018 1042   GFRNONAA >60 05/08/2023 1703   GFRAA 96 05/12/2020 1727   Lab Results  Component Value Date   CHOL 119 12/27/2017   HDL 31 (L) 12/27/2017   LDLCALC 80 12/27/2017   TRIG 41 12/27/2017   CHOLHDL 3.8 12/27/2017   Lab Results  Component Value Date   HGBA1C 5.3 12/27/2017   No results found for: "VITAMINB12" Lab Results  Component Value Date   TSH 1.560 02/19/2023       ASSESSMENT AND PLAN  Idiopathic intracranial hypertension  Optic nerve edema  Chronic nonintractable headache, unspecified headache type  Visual field defect   The optic discs look better.   She will continue to follow-up with Dr. Sherryll Burger    Continue Diamox 500 mg po bid. Encouraged to lose weight (today's weight unchanged compared to 9.2024) Rtc 8 months, sooner if new or worsening issues   Diana Huynh A. Epimenio Foot, MD, [facility] 12/18/2023, 3:20 PM Certified in Neurology, Clinical Neurophysiology, Sleep Medicine and Neuroimaging  [facility] [address] [place], [place] [address] [phone]

## 2023-12-19 ENCOUNTER — Other Ambulatory Visit: Payer: Self-pay

## 2023-12-19 ENCOUNTER — Emergency Department (HOSPITAL_COMMUNITY)

## 2023-12-19 ENCOUNTER — Emergency Department (HOSPITAL_COMMUNITY)
Admission: EM | Admit: 2023-12-19 | Discharge: 2023-12-20 | Disposition: A | Discharge status: Home / Self Care | Attending: Emergency Medicine | Admitting: Emergency Medicine

## 2023-12-19 DIAGNOSIS — R197 Diarrhea, unspecified: Secondary | ICD-10-CM | POA: Insufficient documentation

## 2023-12-19 DIAGNOSIS — E876 Hypokalemia: Secondary | ICD-10-CM | POA: Diagnosis not present

## 2023-12-19 DIAGNOSIS — D649 Anemia, unspecified: Secondary | ICD-10-CM

## 2023-12-19 DIAGNOSIS — M329 Systemic lupus erythematosus, unspecified: Secondary | ICD-10-CM | POA: Diagnosis not present

## 2023-12-19 DIAGNOSIS — R1032 Left lower quadrant pain: Secondary | ICD-10-CM | POA: Diagnosis not present

## 2023-12-19 DIAGNOSIS — N390 Urinary tract infection, site not specified: Secondary | ICD-10-CM | POA: Diagnosis not present

## 2023-12-19 DIAGNOSIS — R079 Chest pain, unspecified: Secondary | ICD-10-CM | POA: Diagnosis not present

## 2023-12-19 DIAGNOSIS — N83201 Unspecified ovarian cyst, right side: Secondary | ICD-10-CM

## 2023-12-19 DIAGNOSIS — R739 Hyperglycemia, unspecified: Secondary | ICD-10-CM

## 2023-12-19 DIAGNOSIS — R1030 Lower abdominal pain, unspecified: Secondary | ICD-10-CM | POA: Insufficient documentation

## 2023-12-19 DIAGNOSIS — R000 Tachycardia, unspecified: Secondary | ICD-10-CM | POA: Insufficient documentation

## 2023-12-19 DIAGNOSIS — R1110 Vomiting, unspecified: Secondary | ICD-10-CM | POA: Insufficient documentation

## 2023-12-19 DIAGNOSIS — R0789 Other chest pain: Secondary | ICD-10-CM | POA: Diagnosis not present

## 2023-12-19 LAB — URINALYSIS, ROUTINE W REFLEX MICROSCOPIC
Bilirubin Urine: NEGATIVE
Glucose, UA: NEGATIVE mg/dL
Hgb urine dipstick: NEGATIVE
Ketones, ur: NEGATIVE mg/dL
Leukocytes,Ua: NEGATIVE
Nitrite: POSITIVE — AB
Protein, ur: NEGATIVE mg/dL
Specific Gravity, Urine: 1.017 (ref 1.005–1.030)
pH: 7 (ref 5.0–8.0)

## 2023-12-19 LAB — BASIC METABOLIC PANEL
Anion gap: 9 (ref 5–15)
BUN: 9 mg/dL (ref 6–20)
CO2: 18 mmol/L — ABNORMAL LOW (ref 22–32)
Calcium: 8.9 mg/dL (ref 8.9–10.3)
Chloride: 108 mmol/L (ref 98–111)
Creatinine, Ser: 1.03 mg/dL — ABNORMAL HIGH (ref 0.44–1.00)
GFR, Estimated: >60 mL/min (ref >60)
Glucose, Bld: 110 mg/dL — ABNORMAL HIGH (ref 70–99)
Potassium: 3.2 mmol/L — ABNORMAL LOW (ref 3.5–5.1)
Sodium: 135 mmol/L (ref 135–145)

## 2023-12-19 LAB — LIPASE, BLOOD: Lipase: 34 U/L (ref 11–51)

## 2023-12-19 LAB — RESP PANEL BY RT-PCR (RSV, FLU A&B, COVID)  RVPGX2
Influenza A by PCR: NEGATIVE
Influenza B by PCR: NEGATIVE
Resp Syncytial Virus by PCR: NEGATIVE
SARS Coronavirus 2 by RT PCR: NEGATIVE

## 2023-12-19 LAB — TROPONIN I (HIGH SENSITIVITY): Troponin I (High Sensitivity): <2 ng/L (ref <18)

## 2023-12-19 LAB — CBC WITH DIFFERENTIAL/PLATELET
Abs Immature Granulocytes: 0.03 10*3/uL (ref 0.00–0.07)
Basophils Absolute: 0.1 10*3/uL (ref 0.0–0.1)
Basophils Relative: 1 %
Eosinophils Absolute: 0.2 10*3/uL (ref 0.0–0.5)
Eosinophils Relative: 2 %
HCT: 37.5 % (ref 36.0–46.0)
Hemoglobin: 11.6 g/dL — ABNORMAL LOW (ref 12.0–15.0)
Immature Granulocytes: 0 %
Lymphocytes Relative: 38 %
Lymphs Abs: 3.2 10*3/uL (ref 0.7–4.0)
MCH: 27.3 pg (ref 26.0–34.0)
MCHC: 30.9 g/dL (ref 30.0–36.0)
MCV: 88.2 fL (ref 80.0–100.0)
Monocytes Absolute: 0.7 10*3/uL (ref 0.1–1.0)
Monocytes Relative: 8 %
Neutro Abs: 4.3 10*3/uL (ref 1.7–7.7)
Neutrophils Relative %: 51 %
Platelets: 303 10*3/uL (ref 150–400)
RBC: 4.25 MIL/uL (ref 3.87–5.11)
RDW: 17.1 % — ABNORMAL HIGH (ref 11.5–15.5)
WBC: 8.5 10*3/uL (ref 4.0–10.5)
nRBC: 0 % (ref 0.0–0.2)

## 2023-12-19 LAB — HCG, SERUM, QUALITATIVE: Preg, Serum: NEGATIVE

## 2023-12-19 MED ORDER — IOHEXOL 350 MG/ML SOLN
100.0000 mL | Freq: Once | INTRAVENOUS | Status: AC | PRN
  Administered 2023-12-19: 100 mL via INTRAVENOUS

## 2023-12-19 MED ORDER — KETOROLAC TROMETHAMINE 30 MG/ML IJ SOLN
30.0000 mg | Freq: Once | INTRAMUSCULAR | Status: AC
  Administered 2023-12-20: 30 mg via INTRAVENOUS
  Filled 2023-12-19: qty 1

## 2023-12-19 MED ORDER — HYDROMORPHONE HCL 1 MG/ML IJ SOLN
0.5000 mg | Freq: Once | INTRAMUSCULAR | Status: AC
  Administered 2023-12-19: 0.5 mg via INTRAVENOUS
  Filled 2023-12-19: qty 1

## 2023-12-19 MED ORDER — POTASSIUM CHLORIDE CRYS ER 20 MEQ PO TBCR
40.0000 meq | EXTENDED_RELEASE_TABLET | Freq: Once | ORAL | Status: AC
  Administered 2023-12-20: 40 meq via ORAL
  Filled 2023-12-19: qty 2

## 2023-12-19 MED ORDER — IPRATROPIUM-ALBUTEROL 0.5-2.5 (3) MG/3ML IN SOLN
3.0000 mL | Freq: Once | RESPIRATORY_TRACT | Status: AC
  Administered 2023-12-20: 3 mL via RESPIRATORY_TRACT
  Filled 2023-12-19: qty 3

## 2023-12-19 MED ORDER — ONDANSETRON HCL 4 MG/2ML IJ SOLN
4.0000 mg | Freq: Once | INTRAMUSCULAR | Status: AC | PRN
  Administered 2023-12-19: 4 mg via INTRAVENOUS
  Filled 2023-12-19: qty 2

## 2023-12-19 MED ORDER — ALBUTEROL SULFATE HFA 108 (90 BASE) MCG/ACT IN AERS
2.0000 | INHALATION_SPRAY | RESPIRATORY_TRACT | Status: DC | PRN
  Filled 2023-12-19: qty 6.7

## 2023-12-19 MED ORDER — CEPHALEXIN 500 MG PO CAPS
500.0000 mg | ORAL_CAPSULE | Freq: Once | ORAL | Status: AC
Start: 2023-12-20 — End: 2023-12-20
  Administered 2023-12-20: 500 mg via ORAL
  Filled 2023-12-19: qty 1

## 2023-12-19 NOTE — ED Provider Notes (Incomplete Revision)
 Care assumed from Dr. Rubin Payor, patient with SLE and presents with pleuritic chest pain and abdominal pain. She is pending CTA chest, CT abdomen/pelvis. May need admission for possible pericarditis.   Diana Booze, MD 12/19/23 2302  CT scans show no evidence of pulmonary embolism, no evidence of significant pericardial effusion, incidental finding of 5.5 cm right ovarian cyst which will need to have a follow-up ultrasound in 3-6 months.  I suspect her symptoms are related to a flareup of her lupus.  I have ordered a nebulizer treatment with albuterol and ipratropium and a dose of ketorolac.  I have also ordered oral potassium for her hypokalemia.

## 2023-12-19 NOTE — Discharge Instructions (Addendum)
 Your CT scan shows a 5.5 cyst on the right ovary.  Radiologist has recommended follow-up ultrasound in 3-6 months.  Your primary care provider or your gynecologist can order that.

## 2023-12-19 NOTE — ED Provider Notes (Signed)
 Care assumed from Dr. Rubin Payor, patient with SLE and presents with pleuritic chest pain and abdominal pain. She is pending CTA chest, CT abdomen/pelvis. May need admission for possible pericarditis.   Dione Booze, MD 12/19/23 2302  CT scans show no evidence of pulmonary embolism, no evidence of significant pericardial effusion, incidental finding of 5.5 cm right ovarian cyst which will need to have a follow-up ultrasound in 3-6 months.  I have independently viewed the images, and agree with radiologist's interpretation.  I suspect her symptoms are related to a flareup of her lupus.  I have ordered a nebulizer treatment with albuterol and ipratropium and a dose of ketorolac.  I have also ordered oral potassium for her hypokalemia.  I also note positive nitrite and many bacteria on urinalysis.  I have ordered a dose of cephalexin to treat UTI.  She feels significantly better following above-noted treatment.  I have ordered a dose of prednisone and I am discharging her with prescriptions for prednisone, oral potassium, cefdinir.  I recommended follow-up with her rheumatologist to direct additional steroid use and possible steroid taper.  I have recommended over-the-counter NSAIDs and acetaminophen as needed for pain.  Return for worsening symptoms.  Results for orders placed or performed during the hospital encounter of 12/19/23  Resp panel by RT-PCR (RSV, Flu A&B, Covid) Anterior Nasal Swab   Collection Time: 12/19/23  7:51 PM   Specimen: Anterior Nasal Swab  Result Value Ref Range   SARS Coronavirus 2 by RT PCR NEGATIVE NEGATIVE   Influenza A by PCR NEGATIVE NEGATIVE   Influenza B by PCR NEGATIVE NEGATIVE   Resp Syncytial Virus by PCR NEGATIVE NEGATIVE  Basic metabolic panel   Collection Time: 12/19/23  7:51 PM  Result Value Ref Range   Sodium 135 135 - 145 mmol/L   Potassium 3.2 (L) 3.5 - 5.1 mmol/L   Chloride 108 98 - 111 mmol/L   CO2 18 (L) 22 - 32 mmol/L   Glucose, Bld 110 (H) 70 - 99  mg/dL   BUN 9 6 - 20 mg/dL   Creatinine, Ser 1.61 (H) 0.44 - 1.00 mg/dL   Calcium 8.9 8.9 - 09.6 mg/dL   GFR, Estimated >04 >54 mL/min   Anion gap 9 5 - 15  hCG, serum, qualitative   Collection Time: 12/19/23  7:51 PM  Result Value Ref Range   Preg, Serum NEGATIVE NEGATIVE  Lipase, blood   Collection Time: 12/19/23  7:51 PM  Result Value Ref Range   Lipase 34 11 - 51 U/L  CBC with Differential   Collection Time: 12/19/23  7:51 PM  Result Value Ref Range   WBC 8.5 4.0 - 10.5 K/uL   RBC 4.25 3.87 - 5.11 MIL/uL   Hemoglobin 11.6 (L) 12.0 - 15.0 g/dL   HCT 09.8 11.9 - 14.7 %   MCV 88.2 80.0 - 100.0 fL   MCH 27.3 26.0 - 34.0 pg   MCHC 30.9 30.0 - 36.0 g/dL   RDW 82.9 (H) 56.2 - 13.0 %   Platelets 303 150 - 400 K/uL   nRBC 0.0 0.0 - 0.2 %   Neutrophils Relative % 51 %   Neutro Abs 4.3 1.7 - 7.7 K/uL   Lymphocytes Relative 38 %   Lymphs Abs 3.2 0.7 - 4.0 K/uL   Monocytes Relative 8 %   Monocytes Absolute 0.7 0.1 - 1.0 K/uL   Eosinophils Relative 2 %   Eosinophils Absolute 0.2 0.0 - 0.5 K/uL   Basophils Relative 1 %  Basophils Absolute 0.1 0.0 - 0.1 K/uL   Immature Granulocytes 0 %   Abs Immature Granulocytes 0.03 0.00 - 0.07 K/uL  Troponin I (High Sensitivity)   Collection Time: 12/19/23  7:51 PM  Result Value Ref Range   Troponin I (High Sensitivity) <2 <18 ng/L  Urinalysis, Routine w reflex microscopic -Urine, Clean Catch   Collection Time: 12/19/23 10:59 PM  Result Value Ref Range   Color, Urine YELLOW YELLOW   APPearance CLEAR CLEAR   Specific Gravity, Urine 1.017 1.005 - 1.030   pH 7.0 5.0 - 8.0   Glucose, UA NEGATIVE NEGATIVE mg/dL   Hgb urine dipstick NEGATIVE NEGATIVE   Bilirubin Urine NEGATIVE NEGATIVE   Ketones, ur NEGATIVE NEGATIVE mg/dL   Protein, ur NEGATIVE NEGATIVE mg/dL   Nitrite POSITIVE (A) NEGATIVE   Leukocytes,Ua NEGATIVE NEGATIVE   RBC / HPF 0-5 0 - 5 RBC/hpf   WBC, UA 0-5 0 - 5 WBC/hpf   Bacteria, UA MANY (A) NONE SEEN   Squamous Epithelial  / HPF 0-5 0 - 5 /HPF   Mucus PRESENT    CT Angio Chest PE W and/or Wo Contrast Result Date: 12/19/2023 CLINICAL DATA:  Pulmonary embolism (PE) suspected, high prob. Left side chest pain EXAM: CT ANGIOGRAPHY CHEST WITH CONTRAST TECHNIQUE: Multidetector CT imaging of the chest was performed using the standard protocol during bolus administration of intravenous contrast. Multiplanar CT image reconstructions and MIPs were obtained to evaluate the vascular anatomy. RADIATION DOSE REDUCTION: This exam was performed according to the departmental dose-optimization program which includes automated exposure control, adjustment of the mA and/or kV according to patient size and/or use of iterative reconstruction technique. CONTRAST:  OMNIPAQUE IOHEXOL 350 MG/ML SOLN COMPARISON:  01/20/2020 FINDINGS: Cardiovascular: No filling defects in the pulmonary arteries to suggest pulmonary emboli. Heart is normal size. Aorta is normal caliber. Mediastinum/Nodes: No mediastinal, hilar, or axillary adenopathy. Trachea and esophagus are unremarkable. Thyroid unremarkable. Lungs/Pleura: No confluent opacities or effusions. Upper Abdomen: No acute findings Musculoskeletal: Chest wall soft tissues are unremarkable. No acute bony abnormality. Review of the MIP images confirms the above findings. IMPRESSION: No evidence of pulmonary embolus. No acute cardiopulmonary disease. Electronically Signed   By: Charlett Nose M.D.   On: 12/19/2023 23:33   CT ABDOMEN PELVIS W CONTRAST Result Date: 12/19/2023 CLINICAL DATA:  Left lower quadrant pain EXAM: CT ABDOMEN AND PELVIS WITH CONTRAST TECHNIQUE: Multidetector CT imaging of the abdomen and pelvis was performed using the standard protocol following bolus administration of intravenous contrast. RADIATION DOSE REDUCTION: This exam was performed according to the departmental dose-optimization program which includes automated exposure control, adjustment of the mA and/or kV according to patient  size and/or use of iterative reconstruction technique. CONTRAST:  OMNIPAQUE IOHEXOL 350 MG/ML SOLN COMPARISON:  01/05/2018 FINDINGS: Lower chest: No acute abnormality Hepatobiliary: No focal hepatic abnormality. Gallbladder unremarkable. Pancreas: No focal abnormality or ductal dilatation. Spleen: No focal abnormality.  Normal size. Adrenals/Urinary Tract: No adrenal abnormality. No focal renal abnormality. No stones or hydronephrosis. Urinary bladder is unremarkable. Stomach/Bowel: Stomach, large and small bowel grossly unremarkable. Vascular/Lymphatic: No evidence of aneurysm or adenopathy. Reproductive: 5.5 cm right ovarian cyst. Uterus and left adnexa unremarkable. Other: No free fluid or free air. Musculoskeletal: No acute bony abnormality. IMPRESSION: 5.5 cm right ovarian cyst. Follow-up by [place] is recommended in 3-6 months. Note: This recommendation does not apply to premenarchal patients and to those with increased risk (genetic, family history, elevated tumor markers or other high-risk factors) of ovarian  cancer. Reference: JACR 2020 Feb; 17(2):248-254 Otherwise no acute findings in the abdomen or pelvis. Electronically Signed   By: Charlett Nose M.D.   On: 12/19/2023 23:32   DG Chest 2 View Result Date: 12/19/2023 CLINICAL DATA:  Lambert Mody left-sided chest pain EXAM: CHEST - 2 VIEW COMPARISON:  01/16/2019 FINDINGS: The heart size and mediastinal contours are within normal limits. Both lungs are clear. The visualized skeletal structures are unremarkable. IMPRESSION: No active cardiopulmonary disease. Electronically Signed   By: Minerva Fester M.D.   On: 12/19/2023 20:06      Dione Booze, MD 12/20/23 [id number]

## 2023-12-19 NOTE — ED Provider Notes (Signed)
  EMERGENCY DEPARTMENT AT [facility] Provider Note   CSN: [id number] Arrival date & time: 12/19/23  1929     History  Chief Complaint  Patient presents with   Chest Pain   Shortness of Breath    Diana Huynh is a 43 y.o. female.   Chest Pain Associated symptoms: shortness of breath   Shortness of Breath Associated symptoms: chest pain   Patient with history of lupus.  Currently on treatment.  Has had a history of GERD but now developed worsening chest pain.  Has had pain she thought was GERD but now goes to the left shoulder and somewhat to the back.  States she had a history of pericarditis and states this is starting to feel the same.  Now is also had some nausea and vomiting.  More pain in the chest after that.  Also some suprapubic pain.  No dysuria.  Also history of fibromyalgia and chronic pain.     Home Medications Prior to Admission medications   Medication Sig Start Date End Date Taking? Authorizing Provider  acetaZOLAMIDE ER (DIAMOX) 500 MG capsule Take 1 capsule (500 mg total) by mouth 2 (two) times daily. 12/18/23   Sater, Pearletha Furl, MD  albuterol (VENTOLIN HFA) 108 (90 Base) MCG/ACT inhaler Inhale 1-2 puffs into the lungs every 6 (six) hours as needed for wheezing or shortness of breath. 05/07/20   Cathie Hoops, Amy V, PA-C  amLODipine (NORVASC) 5 MG tablet Take 5 mg by mouth daily. 08/02/22   [provider]  butalbital-acetaminophen-caffeine (FIORICET) 50-325-40 MG tablet Take 1-2 tablets by mouth every 6 (six) hours as needed for headache. 09/24/23 09/23/24  Micki Riley, MD  DULoxetine (CYMBALTA) 60 MG capsule Take 60 mg by mouth every evening.    [provider]  famotidine (PEPCID) 20 MG tablet Take 20 mg by mouth daily.     [provider]  hydroxychloroquine (PLAQUENIL) 200 MG tablet Take 400 mg by mouth daily.    [provider]  ibuprofen (ADVIL) 800 MG tablet Take 1 tablet (800 mg total) by mouth 2 (two)  times daily as needed. 12/04/23   Atway, Rayann N, DO  pregabalin (LYRICA) 150 MG capsule Take 150 mg by mouth 2 (two) times daily.    [provider]  tiZANidine (ZANAFLEX) 4 MG tablet Take 4 mg by mouth 2 (two) times daily. 12/17/23 03/16/24  [provider]  triamcinolone (KENALOG) 0.025 % ointment Apply 1 Application topically 2 (two) times daily. 02/19/23   Quincy Simmonds, MD      Allergies    Patient has no known allergies.    Review of Systems   Review of Systems  Respiratory:  Positive for shortness of breath.   Cardiovascular:  Positive for chest pain.    Physical Exam Updated Vital Signs BP 129/81 (BP Location: Left Arm)   Pulse 60   Temp 98.2 F (36.8 C) (Oral)   Resp 18   Ht 5\' 1"  (1.549 m)   Wt 104.3 kg   LMP 12/04/2023 (Exact Date)   SpO2 97%   BMI 43.46 kg/m  Physical Exam Vitals and nursing note reviewed.  HENT:     Head: Atraumatic.  Cardiovascular:     Rate and Rhythm: Regular rhythm. Tachycardia present.  Chest:     Chest wall: Tenderness present.     Comments: Tenderness to anterior chest wall. Abdominal:     Tenderness: There is abdominal tenderness.     Comments: Super pubic tenderness  with some potential fullness.  Skin:    General: Skin is warm.     Capillary Refill: Capillary refill takes less than 2 seconds.  Neurological:     Mental Status: She is alert.     ED Results / Procedures / Treatments   Labs (all labs ordered are listed, but only abnormal results are displayed) Labs Reviewed  BASIC METABOLIC PANEL - Abnormal; Notable for the following components:      Result Value   Potassium 3.2 (*)    CO2 18 (*)    Glucose, Bld 110 (*)    Creatinine, Ser 1.03 (*)    All other components within normal limits  CBC WITH DIFFERENTIAL/PLATELET - Abnormal; Notable for the following components:   Hemoglobin 11.6 (*)    RDW 17.1 (*)    All other components within normal limits  RESP PANEL BY RT-PCR (RSV, FLU A&B, COVID)  RVPGX2   HCG, SERUM, QUALITATIVE  LIPASE, BLOOD  URINALYSIS, ROUTINE W REFLEX MICROSCOPIC  TROPONIN I (HIGH SENSITIVITY)  TROPONIN I (HIGH SENSITIVITY)    EKG EKG Interpretation Date/Time:  Thursday December 19 2023 19:43:57 EDT Ventricular Rate:  109 PR Interval:  132 QRS Duration:  94 QT Interval:  365 QTC Calculation: 492 R Axis:   90  Text Interpretation: Sinus tachycardia Borderline right axis deviation Nonspecific T abnormalities, diffuse leads Borderline prolonged QT interval Confirmed by Benjiman Core [phone]) on 12/19/2023 9:56:25 PM  Radiology DG Chest 2 View Result Date: 12/19/2023 CLINICAL DATA:  Lambert Mody left-sided chest pain EXAM: CHEST - 2 VIEW COMPARISON:  01/16/2019 FINDINGS: The heart size and mediastinal contours are within normal limits. Both lungs are clear. The visualized skeletal structures are unremarkable. IMPRESSION: No active cardiopulmonary disease. Electronically Signed   By: Minerva Fester M.D.   On: 12/19/2023 20:06    Procedures Procedures    Medications Ordered in ED Medications  albuterol (VENTOLIN HFA) 108 (90 Base) MCG/ACT inhaler 2 puff (has no administration in time range)  ondansetron (ZOFRAN) injection 4 mg (4 mg Intravenous Given 12/19/23 1954)  HYDROmorphone (DILAUDID) injection 0.5 mg (0.5 mg Intravenous Given 12/19/23 2256)    ED Course/ Medical Decision Making/ A&P                                 Medical Decision Making Amount and/or Complexity of Data Reviewed Labs: ordered. Radiology: ordered.  Risk Prescription drug management.   Patient with chest pain and abdominal pain.  Has had some vomiting.  Also some diarrhea.  With chest pain differential diagnoses long but includes cause such as GERD, pericarditis, musculoskeletal pain.  EKG shows nonspecific T wave changes.  Negative troponin.  Pulmonary below some considered with her history of lupus and tachycardia and pleuritic pain.  However will need CT scan for abdominal pain so  we will get CTA PE to evaluate.  Also abdominal pain with suprapubic tenderness.  Has had some vomiting.  Has had immunosuppression.  Will get CT scan to evaluate.  Care turned over to Dr Preston Fleeting        Final Clinical Impression(s) / ED Diagnoses Final diagnoses:  Systemic lupus erythematosus, unspecified SLE type, unspecified organ involvement status [facility])    Rx / DC Orders ED Discharge Orders     None         Benjiman Core, MD 12/19/23 2258

## 2023-12-19 NOTE — ED Triage Notes (Signed)
 Pt coming in with reports of sharp left sided CP radiating to left shoulder starting when she woke up today. Hx of pericarditis, lupus. Associated SOB, body aches, nausea. Nausea since infusion last Wednesday for lupus. Pt states she thinks she's having a lupus flare up at this time. Labs drawn yesterday at rheumatologist.

## 2023-12-19 NOTE — ED Notes (Signed)
 Patient transported to CT

## 2023-12-20 MED ORDER — PREDNISONE 50 MG PO TABS: 50.0000 mg | ORAL_TABLET | Freq: Every day | ORAL | 0 refills | Status: DC

## 2023-12-20 MED ORDER — POTASSIUM CHLORIDE CRYS ER 20 MEQ PO TBCR: 20.0000 meq | EXTENDED_RELEASE_TABLET | Freq: Two times a day (BID) | ORAL | 0 refills | Status: DC

## 2023-12-20 MED ORDER — PREDNISONE 20 MG PO TABS
60.0000 mg | ORAL_TABLET | Freq: Once | ORAL | Status: AC
  Administered 2023-12-20: 60 mg via ORAL
  Filled 2023-12-20: qty 3

## 2023-12-20 MED ORDER — CEFDINIR 300 MG PO CAPS: 300.0000 mg | ORAL_CAPSULE | Freq: Two times a day (BID) | ORAL | 0 refills | Status: DC

## 2023-12-20 NOTE — ED Notes (Signed)
 Reviewed D/C information with the patient, pt verbalized understanding. No additional concerns at this time.

## 2023-12-23 ENCOUNTER — Ambulatory Visit: Payer: Self-pay | Admitting: *Deleted

## 2023-12-23 ENCOUNTER — Ambulatory Visit: Admitting: Student

## 2023-12-23 DIAGNOSIS — N83209 Unspecified ovarian cyst, unspecified side: Secondary | ICD-10-CM

## 2023-12-23 NOTE — Progress Notes (Signed)
   CC: RLQ pain  This is a telephone encounter between [employer] and Morrie Sheldon on 12/23/2023 for RLQ pain. The visit was conducted with the patient located at home and Morrie Sheldon at [facility]. The patient's identity was confirmed using their DOB and current address. The patient has consented to being evaluated through a telephone encounter and understands the associated risks (an examination cannot be done and the patient may need to come in for an appointment) / benefits (allows the patient to remain at home, decreasing exposure to coronavirus). I personally spent 30 minutes on medical discussion.   HPI:  [name] is a 43 y.o. with PMH as below.   Please see A&P for assessment of the patient's acute and chronic medical conditions.   Past Medical History:  Diagnosis Date   Bronchitis    Fibromyalgia    Lupus    Pericarditis    per pt report   Pleuritis 12/31/2017   Review of Systems:  Negative unless otherwise noted    Assessment & Plan:   Ovarian cyst Patient presents for virtual care visit with worsening pain in her stomach, pelvis, and back.  She went to the ED on 12/19/2023 with pleuritic chest pain and abdominal pain.  Today, she presents with worsening of her abdominal pain.  She feels that the pain worsened on Saturday when she went to walk to the bathroom and developed pain in her right lower quadrant.  The pain was sharp in nature and radiated down her leg.  The pain was also present in her right lower back.  The pain has not been relieved with Tylenol.  She rates it as 10/10.  Her last period was on 11/29/2023.  She notes that she sometimes has pain related to her menstrual cycle, but this feels worse.  She has had some nausea and vomiting.  She denies any fevers or chills.  During her visit in the ED, a 5.5 cm right ovarian cyst was noted.  Today, her pain may be secondary to the cyst.  A ruptured ovarian cyst is unlikely as the pain would likely be much more severe.   Appendicitis is unlikely as patient has had this removed in the past and she has not had no leukocytosis.  Will trial with NSAID.  Patient was counseled on if the pain worsens or she develops a fever to return to the ED.  Otherwise, she is agreeable come in for an acute care visit tomorrow for an in-person assessment. - Begin NSAID as needed for pain - Follow-up for in person visit tomorrow    Patient discussed with Dr. Darletta Moll, MD  Internal Medicine Resident

## 2023-12-23 NOTE — Telephone Encounter (Signed)
 Called pt who stated she went to the ER; continues to have "a lot of pain" when she walks; pain in her stomach, pelvis and back. Offered her an appt; unable to come in person - telehealth appt given w/Dr Rayvon Char today 3/24 @ 1315PM.

## 2023-12-23 NOTE — Assessment & Plan Note (Addendum)
 Patient presents for virtual care visit with worsening pain in her stomach, pelvis, and back.  She went to the ED on 12/19/2023 with pleuritic chest pain and abdominal pain.  Today, she presents with worsening of her abdominal pain.  She feels that the pain worsened on Saturday when she went to walk to the bathroom and developed pain in her right lower quadrant.  The pain was sharp in nature and radiated down her leg.  The pain was also present in her right lower back.  The pain has not been relieved with Tylenol.  She rates it as 10/10.  Her last period was on 11/29/2023.  She notes that she sometimes has pain related to her menstrual cycle, but this feels worse.  She has had some nausea and vomiting.  She denies any fevers or chills.  During her visit in the ED, a 5.5 cm right ovarian cyst was noted.  Today, her pain may be secondary to the cyst.  A ruptured ovarian cyst is unlikely as the pain would likely be much more severe.  Appendicitis is unlikely as patient has had this removed in the past and she has not had no leukocytosis.  Will trial with NSAID.  Patient was counseled on if the pain worsens or she develops a fever to return to the ED.  Otherwise, she is agreeable come in for an acute care visit tomorrow for an in-person assessment. - Begin NSAID as needed for pain - Follow-up for in person visit tomorrow

## 2023-12-23 NOTE — Telephone Encounter (Signed)
 Copied from CRM [id number]. Topic: Clinical - Medication Question >> Dec 23, 2023  8:10 AM Hector Shade B wrote: Reason for CRM: Patient is needing to speaking with provider about getting some pain medication. She stated upon her visit to the ER with the cyst on her ovaries the doctor advised her to take Ibuprofen and alternate with Tylenol. Pt stated that is not working and needs something to aide her in relieving the pain so she can tolerate it. Please call patient at [phone] >> Dec 23, 2023  8:23 AM Dondra Prader A wrote: Patient called back stating that she forgot to add when she went to the ER Thursday they also found out she has a UTI.     Chief Complaint: abdominal pain Symptoms: Patient has ovarian cyst and LUPUS flare Frequency: pain started Th/Fr- went to ED because pain medication she uses for her chroninc medications is not helping Pertinent Negatives: Patient denies diarrhea, fever, urinary pain Disposition: [] ED /[] Urgent Care (no appt availability in office) / [] Appointment(In office/virtual)/ []  [facility]/ [] Home Care/ [] Refused Recommended Disposition /[] [place] [facility]/ [x]  Follow-up with PCP Additional Notes: Patient advised for additional pain medication she needs to be seen- she states she does not want to go out today- she does need follow up for her uncontrolled pain. Patient states she would like PCP to review her ED visit and Rx pain medication for her.    Reason for Disposition  [1] SEVERE pain (e.g., excruciating) AND [2] present > 1 hour  Answer Assessment - Initial Assessment Questions 1. LOCATION: "Where does it hurt?"      RLQ  3. ONSET: "When did the pain begin?" (e.g., minutes, hours or days ago)      Th/FR pm/am 4. SUDDEN: "Gradual or sudden onset?"     Sharp pain 5. PATTERN "Does the pain come and go, or is it constant?"    - If it comes and goes: "How long does it last?" "Do you have pain now?"     (Note: Comes and goes means the pain  is intermittent. It goes away completely between bouts.)    - If constant: "Is it getting better, staying the same, or getting worse?"      (Note: Constant means the pain never goes away completely; most serious pain is constant and gets worse.)      Constant- sharp pain, cramping 6. SEVERITY: "How bad is the pain?"  (e.g., Scale 1-10; mild, moderate, or severe)    - MILD (1-3): Doesn't interfere with normal activities, abdomen soft and not tender to touch.     - MODERATE (4-7): Interferes with normal activities or awakens from sleep, abdomen tender to touch.     - SEVERE (8-10): Excruciating pain, doubled over, unable to do any normal activities.       severe 7. RECURRENT SYMPTOM: "Have you ever had this type of stomach pain before?" If Yes, ask: "When was the last time?" and "What happened that time?"      Never had cyst in past, never had UTI 8. CAUSE: "What do you think is causing the stomach pain?"     UTI, Ovarian cyst-R 9. RELIEVING/AGGRAVATING FACTORS: "What makes it better or worse?" (e.g., antacids, bending or twisting motion, bowel movement)     Ibuprofen, heating pad 10. OTHER SYMPTOMS: "Do you have any other symptoms?" (e.g., back pain, diarrhea, fever, urination pain, vomiting)       Back pain, some nausea and vomiting Saturday  Protocols used:  Abdominal Pain - Female-A-AH

## 2023-12-24 ENCOUNTER — Ambulatory Visit: Admitting: Student

## 2023-12-24 ENCOUNTER — Encounter: Payer: Self-pay | Admitting: Student

## 2023-12-24 VITALS — BP 140/73 | HR 96 | Temp 98.4°F | Ht 61.0 in | Wt 227.7 lb

## 2023-12-24 DIAGNOSIS — R1084 Generalized abdominal pain: Secondary | ICD-10-CM | POA: Diagnosis not present

## 2023-12-24 DIAGNOSIS — R109 Unspecified abdominal pain: Secondary | ICD-10-CM | POA: Insufficient documentation

## 2023-12-24 NOTE — Progress Notes (Signed)
 CC: Abdominal pain   HPI: [name] is a 43 y.o. female living with a history stated below and presents today for abdominal pain. Please see problem based assessment and plan for additional details.  Past Medical History:  Diagnosis Date   Bronchitis    Fibromyalgia    Lupus    Pericarditis    per pt report   Pleuritis 12/31/2017    Current Outpatient Medications on File Prior to Visit  Medication Sig Dispense Refill   acetaZOLAMIDE ER (DIAMOX) 500 MG capsule Take 1 capsule (500 mg total) by mouth 2 (two) times daily. 180 capsule 3   albuterol (VENTOLIN HFA) 108 (90 Base) MCG/ACT inhaler Inhale 1-2 puffs into the lungs every 6 (six) hours as needed for wheezing or shortness of breath. 8 g 0   amLODipine (NORVASC) 5 MG tablet Take 5 mg by mouth daily.     butalbital-acetaminophen-caffeine (FIORICET) 50-325-40 MG tablet Take 1-2 tablets by mouth every 6 (six) hours as needed for headache. 20 tablet 0   cefdinir (OMNICEF) 300 MG capsule Take 1 capsule (300 mg total) by mouth 2 (two) times daily. 14 capsule 0   DULoxetine (CYMBALTA) 60 MG capsule Take 60 mg by mouth every evening.     famotidine (PEPCID) 20 MG tablet Take 20 mg by mouth daily.      hydroxychloroquine (PLAQUENIL) 200 MG tablet Take 400 mg by mouth daily.     ibuprofen (ADVIL) 800 MG tablet Take 1 tablet (800 mg total) by mouth 2 (two) times daily as needed. 60 tablet 1   potassium chloride SA (KLOR-CON M) 20 MEQ tablet Take 1 tablet (20 mEq total) by mouth 2 (two) times daily. 10 tablet 0   predniSONE (DELTASONE) 50 MG tablet Take 1 tablet (50 mg total) by mouth daily. 5 tablet 0   pregabalin (LYRICA) 150 MG capsule Take 150 mg by mouth 2 (two) times daily.     tiZANidine (ZANAFLEX) 4 MG tablet Take 4 mg by mouth 2 (two) times daily.     triamcinolone (KENALOG) 0.025 % ointment Apply 1 Application topically 2 (two) times daily. 30 g 0   No current facility-administered medications on file prior to visit.     Family History  Problem Relation Age of Onset   Hypertension Mother    Heart murmur Mother    Healthy Father    Thyroid disease Sister    Breast cancer Paternal Grandmother    Breast cancer Paternal Aunt    Heart failure Sister        has ICD   Cervical cancer Sister     Social History   Socioeconomic History   Marital status: Married    Spouse name: Not on file   Number of children: Not on file   Years of education: Not on file   Highest education level: Not on file  Occupational History   Not on file  Tobacco Use   Smoking status: Every Day    Current packs/day: 0.00    Types: Cigarettes    Last attempt to quit: 09/20/2018    Years since quitting: 5.2   Smokeless tobacco: Never   Tobacco comments:    4-5 cigs/day  Vaping Use   Vaping status: Former  Substance and Sexual Activity   Alcohol use: Not Currently   Drug use: No   Sexual activity: Yes  Other Topics Concern   Not on file  Social History Narrative   Caffiene coffee 2-4 cups daily.  Working:  none   Husband  with 2 grown kids.    Social Drivers of Corporate investment banker Strain: Low Risk  (09/06/2022)   Overall Financial Resource Strain (CARDIA)    Difficulty of Paying Living Expenses: Not very hard  Food Insecurity: Food Insecurity Present (04/19/2023)   Hunger Vital Sign    Worried About Running Out of Food in the Last Year: Sometimes true    Ran Out of Food in the Last Year: Sometimes true  Transportation Needs: No Transportation Needs (04/19/2023)   PRAPARE - Administrator, Civil Service (Medical): No    Lack of Transportation (Non-Medical): No  Physical Activity: Not on file  Stress: Not on file  Social Connections: Socially Isolated (09/06/2022)   Social Connection and Isolation Panel [NHANES]    Frequency of Communication with Friends and Family: Never    Frequency of Social Gatherings with Friends and Family: Never    Attends Religious Services: Never    Automotive engineer or Organizations: No    Attends Banker Meetings: Never    Marital Status: Married  Catering manager Violence: Not At Risk (09/06/2022)   Humiliation, Afraid, Rape, and Kick questionnaire    Fear of Current or Ex-Partner: No    Emotionally Abused: No    Physically Abused: No    Sexually Abused: No    Review of Systems: ROS negative except for what is noted on the assessment and plan.  Vitals:   12/24/23 0843  BP: (!) 140/73  Pulse: 96  Temp: 98.4 F (36.9 C)  TempSrc: Oral  SpO2: 100%  Weight: 227 lb 11.2 oz (103.3 kg)  Height: 5\' 1"  (1.549 m)    Physical Exam: Constitutional: no acute distress Eyes: conjunctiva non-erythematous Neck: supple Cardiovascular: regular rate and rhythm, no m/r/g Pulmonary/Chest: normal work of breathing on room air, lungs clear to auscultation bilaterally Abdominal: diffuse generalized tenderness, possibly localized to RLQ; no guarding, distension, or rebound tenderness MSK: Tenderness in SCM Neurological: alert & oriented x 3, 5/5 strength in bilateral upper and lower extremities Skin: warm and dry  Assessment & Plan:   Abdominal pain Patient presents for follow-up from virtual acute care visit yesterday.  She was seen yesterday after persistent abdominal pain following a visit to the ED on 12/19/2023.  In the ED, she was seen for pleuritic chest pain and abdominal pain.  At that visit, an incidental 5.5 right ovarian cyst was identified.  She was discharged with stabilization of her pain.  She was counseled on taking ibuprofen and Tylenol for continued pain after discharge.  Yesterday, she was seen for a virtual visit with worsening of the pain.  She had not taken ibuprofen and Tylenol as directed by the ED.  During her visit yesterday, I was concerned but had a low suspicion that she may have a more acute presentation such as a ruptured ovarian cyst, ovarian torsion, or appendicitis.  I recommended that she come in the  clinic today for further evaluation and to take naproxen to help with the pain in the meantime, as well as counseling her on returning to the ED if her presentation worsens.  She came in today with persistent pain in alignment with our discussion yesterday. Of note, patient also mentioned that she began her menstruation today.  She did not take the naproxen as discussed yesterday.  On physical examination, the patient had diffuse epigastric pain.  There was no guarding, distention, or other red  flag signs.  On physical examination, the patient then endorsed chronic pain localized to her shoulders and back that was chronic.  I discussed the case with Dr. Deirdre Priest, and we agreed that the next Panzer course of action would be to trial ibuprofen and Tylenol for pain alleviation.  We had a low suspicion for a more acute presentation, even in the context of her high prednisone dosage.  I discussed the plan with the patient, and the patient replied with "No".  The patient noted that she did not feel comfortable taking Tylenol and ibuprofen together, and I reassured her that this would be for a short duration.  I also noted that I would provide instructions in her AVS discussing how much, how long, and when to take the medication.  The patient then replied "No" and "I want to go home." I asked if she could give me a couple minutes to print out her AVS, and she declined.  I counseled her on if the signs/symptoms worsen to go back to the ED.  I also verified that she has MyChart and could find the instructions in the after visit summary.  Patient had a tearful affect as she was walking out of the door.  I followed up with Dr. Deirdre Priest to discuss next steps.  This case was complicated as she is on a high dose of prednisone and has comorbidities of chronic pain syndrome, lupus, and fibromyalgia.  I will attempt to call the patient back from a few days to follow-up on her case. - Take 400 mg ibuprofen and 1000 mg of Tylenol  every 8 hours as needed for 3 to 5 days for pain relief  Patient discussed with Dr.  Brigitte Pulse, MD  [facility] Internal Medicine, PGY-1 Date 12/24/2023 Time 10:56 AM

## 2023-12-24 NOTE — Assessment & Plan Note (Addendum)
 Patient presents for follow-up from virtual acute care visit yesterday.  She was seen yesterday after persistent abdominal pain following a visit to the ED on 12/19/2023.  In the ED, she was seen for pleuritic chest pain and abdominal pain.  At that visit, an incidental 5.5 right ovarian cyst was identified.  She was discharged with stabilization of her pain.  She was counseled on taking ibuprofen and Tylenol for continued pain after discharge.  Yesterday, she was seen for a virtual visit with worsening of the pain.  She had not taken ibuprofen and Tylenol as directed by the ED.  During her visit yesterday, I was concerned but had a low suspicion that she may have a more acute presentation such as a ruptured ovarian cyst, ovarian torsion, or appendicitis.  I recommended that she come in the clinic today for further evaluation and to take naproxen to help with the pain in the meantime, as well as counseling her on returning to the ED if her presentation worsens.  She came in today with persistent pain in alignment with our discussion yesterday. Of note, patient also mentioned that she began her menstruation today.  She did not take the naproxen as discussed yesterday.  On physical examination, the patient had diffuse epigastric pain.  There was no guarding, distention, or other red flag signs.  On physical examination, the patient then endorsed chronic pain localized to her shoulders and back that was chronic.  I discussed the case with Dr. Deirdre Priest, and we agreed that the next [place] course of action would be to trial ibuprofen and Tylenol for pain alleviation.  We had a low suspicion for a more acute presentation, even in the context of her high prednisone dosage.  I discussed the plan with the patient, and the patient replied with "No".  The patient noted that she did not feel comfortable taking Tylenol and ibuprofen together, and I reassured her that this would be for a short duration.  I also noted that I would  provide instructions in her AVS discussing how much, how long, and when to take the medication.  The patient then replied "No" and "I want to go home." I asked if she could give me a couple minutes to print out her AVS, and she declined.  I counseled her on if the signs/symptoms worsen to go back to the ED.  I also verified that she has MyChart and could find the instructions in the after visit summary.  Patient had a tearful affect as she was walking out of the door.  I followed up with Dr. Deirdre Priest to discuss next steps.  This case was complicated as she is on a high dose of prednisone and has comorbidities of chronic pain syndrome, lupus, and fibromyalgia.  I will attempt to call the patient back from a few days to follow-up on her case. - Take 400 mg ibuprofen and 1000 mg of Tylenol every 8 hours as needed for 3 to 5 days for pain relief

## 2023-12-24 NOTE — Patient Instructions (Signed)
 For your abdominal pain, please take 400 mg of ibuprofen and 1000 mg of Tylenol every 8 hours as tolerated. Please take this for a total of three to five days.   If your pain worsens, you develop vomiting, or high fever, please return to the ED for follow-up.   If you have any questions please feel free to the call the clinic at anytime at [phone].   [place], Dr. Rayvon Char

## 2023-12-27 ENCOUNTER — Telehealth: Payer: Self-pay | Admitting: Student

## 2023-12-27 NOTE — Telephone Encounter (Signed)
 Called patient to follow-up from Tuesday's visit. See addendum in note for further info.

## 2023-12-31 NOTE — Progress Notes (Signed)
 Internal Medicine Clinic Attending  Case discussed with the resident at the time of the visit.  We reviewed the resident's history and exam and pertinent patient test results.  I agree with the assessment, diagnosis, and plan of care documented in the resident's note.

## 2024-01-01 NOTE — Progress Notes (Signed)
 Internal Medicine Clinic Attending  Case discussed with the resident at the time of the visit.  We reviewed the resident's history and exam and pertinent patient test results.  I agree with the assessment, diagnosis, and plan of care documented in the resident's note.

## 2024-01-08 DIAGNOSIS — M3219 Other organ or system involvement in systemic lupus erythematosus: Secondary | ICD-10-CM | POA: Diagnosis not present

## 2024-01-27 ENCOUNTER — Telehealth: Payer: Self-pay | Admitting: *Deleted

## 2024-01-27 NOTE — Telephone Encounter (Signed)
 Copied from CRM [id number]. Topic: General - Other >> Jan 27, 2024  8:03 AM Shelby Dessert H wrote: Reason for CRM: Patient is needing to speak with Dr. Verlene Glimpse about a medical source statement form, patient is in the appeal process for social security and her attorney wants to know if provider can support her in the process, callback number is [phone]. Patient needs confirmation by today.

## 2024-02-03 DIAGNOSIS — M3219 Other organ or system involvement in systemic lupus erythematosus: Secondary | ICD-10-CM | POA: Diagnosis not present

## 2024-03-02 ENCOUNTER — Ambulatory Visit: Payer: Self-pay | Admitting: Internal Medicine

## 2024-03-02 ENCOUNTER — Other Ambulatory Visit: Payer: Self-pay

## 2024-03-02 ENCOUNTER — Encounter: Payer: Self-pay | Admitting: Internal Medicine

## 2024-03-02 VITALS — BP 125/63 | HR 79 | Temp 98.0°F | Ht 61.0 in | Wt 224.6 lb

## 2024-03-02 DIAGNOSIS — N83201 Unspecified ovarian cyst, right side: Secondary | ICD-10-CM

## 2024-03-02 DIAGNOSIS — N946 Dysmenorrhea, unspecified: Secondary | ICD-10-CM | POA: Diagnosis not present

## 2024-03-02 DIAGNOSIS — Z6841 Body Mass Index (BMI) 40.0 and over, adult: Secondary | ICD-10-CM | POA: Diagnosis not present

## 2024-03-02 MED ORDER — IBUPROFEN 800 MG PO TABS
800.0000 mg | ORAL_TABLET | Freq: Two times a day (BID) | ORAL | 0 refills | Status: DC | PRN
Start: 2024-03-02 — End: 2024-04-10

## 2024-03-02 NOTE — Progress Notes (Signed)
 Subjective:  CC: cyst on ovaries  HPI:  [name] is a 43 y.o. female with a past medical history of fibromyalgia, SLE, intracranial hypertension and tobacco use disorder who presents today for concerns for cyst on ovaries. She was last seen in clinic 3/24-3/25 for abdominal pain for virtual visit followed by in person visit the following day. She had visited ED 3/20 due to pain and an incidental cyst was found on her ovaries.   Since March, she has noted worsening in menstrual cramping. She takes ibuprofen  while on period without improvement in pain, but has not tried tylenol  as this has never been helpful for her. She also notes an intermittent right lower quadrant pain that feels like a deep pressure. She continues to have episodes during the day of severe sweating and a prior provider had told her this was likely related to hormone changes.   [facility], last seen 12/17/23. At that visit tizanidine was started and flexeril  discontinued.   Please see problem based assessment and plan for additional details.  Past Medical History:  Diagnosis Date   Bronchitis    Fibromyalgia    Lupus    Pericarditis    per pt report   Pleuritis 12/31/2017    MEDICATIONS:  Acetazolamide  500 mg BID Albuterol   amlodipine 5 mg Fioricet 50-325-40 mg Duloxetine  60 mg Famotidine  20 mg Hydroxychloroquine  200 mg Pregabalin 150 mg BID Tizanidine 4 mg bid Saphnelo  infusions   Family History  Problem Relation Age of Onset   Hypertension Mother    Heart murmur Mother    Healthy Father    Thyroid  disease Sister    Breast cancer Paternal Grandmother    Breast cancer Paternal Aunt    Heart failure Sister        has ICD   Cervical cancer Sister     Past Surgical History:  Procedure Laterality Date   ANKLE SURGERY     APPENDECTOMY     CESAREAN SECTION       Social History   Socioeconomic History   Marital status: Married    Spouse name: Not on file    Number of children: Not on file   Years of education: Not on file   Highest education level: Not on file  Occupational History   Not on file  Tobacco Use   Smoking status: Every Day    Current packs/day: 0.00    Types: Cigarettes    Last attempt to quit: 09/20/2018    Years since quitting: 5.4   Smokeless tobacco: Never   Tobacco comments:    4-5 cigs/day  Vaping Use   Vaping status: Former  Substance and Sexual Activity   Alcohol use: Not Currently   Drug use: No   Sexual activity: Yes  Other Topics Concern   Not on file  Social History Narrative   Caffiene coffee 2-4 cups daily.    Working:  none   Husband  with 2 grown kids.    Social Drivers of Corporate investment banker Strain: Low Risk  (09/06/2022)   Overall Financial Resource Strain (CARDIA)    Difficulty of Paying Living Expenses: Not very hard  Food Insecurity: Food Insecurity Present (04/19/2023)   Hunger Vital Sign    Worried About Running Out of Food in the Last Year: Sometimes true    Ran Out of Food in the Last Year: Sometimes true  Transportation Needs: No Transportation Needs (04/19/2023)   PRAPARE - Transportation  Lack of Transportation (Medical): No    Lack of Transportation (Non-Medical): No  Physical Activity: Not on file  Stress: Not on file  Social Connections: Socially Isolated (09/06/2022)   Social Connection and Isolation Panel [NHANES]    Frequency of Communication with Friends and Family: Never    Frequency of Social Gatherings with Friends and Family: Never    Attends Religious Services: Never    Database administrator or Organizations: No    Attends Banker Meetings: Never    Marital Status: Married  Catering manager Violence: Not At Risk (09/06/2022)   Humiliation, Afraid, Rape, and Kick questionnaire    Fear of Current or Ex-Partner: No    Emotionally Abused: No    Physically Abused: No    Sexually Abused: No    Review of Systems: ROS negative except for what is  noted on the assessment and plan.  Objective:   Vitals:   03/02/24 0844 03/02/24 0923  BP: (!) 117/47 125/63  Pulse: 81 79  Temp: 98 F (36.7 C)   TempSrc: Oral   SpO2: 100%   Weight: 224 lb 9.6 oz (101.9 kg)   Height: 5\' 1"  (1.549 m)     Physical Exam: Constitutional: well-appearing, in no acute distress HENT: wearing sunglasses and notes sensitivity to lights in clinic room Cardiovascular: regular rate and rhythm, no m/r/g Pulmonary/Chest: normal work of breathing on room air, lungs clear to auscultation bilaterally Abdominal: soft, TTP to RLQ, no guarding or rebound tenderness, non-distended Skin: warm and dry  Assessment & Plan:  Ovarian cyst Patient with history of  5.5 cm right ovarian cyst found on CT abd/pelvis 3/20 with recommendations for repeat US  in 3-6 months. She does note that her sister was in her 30s when diagnosed with ovarian cancer. Also has grandmother with breast cancer. She denies night sweats or weight loss. She is unsure if pain is the same as in March. A/P: CT from March with cyst. Will need to repeat imaging to make sure size is unchanged. With her family history of ovarian cancer in a first degree relative, I do think it is appropriate for her to be seen by GYN. My question is if tumor markers are needed in her case as well. Additional would want to know if they have other recommendations to help with period cramps as she is not interested in OCP and is already taking NSAIDs.  Dysmenorrhea She reports increased pain with period over last few months. Her periods remain regular but flow seems heavier. During her period she get cramping that is relieved a small amount with ibuprofen .  She has taken birth control pills in the past and she felt like she had side effects from them so would not want to try them again. P: Referral to GYN   Obesity, morbid, BMI 40.0-49.9 (HCC) At end of visit, patient expressed concern for weight. She has not tried increasing  exercise or made any dietary changes. I encouraged her to try the Mediterranean diet to see if this was helpful and also encouraged her to increase physical activity. P: Follow-up in 4 weeks to review dietary and exercise changes. Time limited at this visit. Could consider GLP-1 at follow-up with additional time to discuss.   Patient discussed with Dr. Loman Risk Alontae Chaloux, D.O. [facility]  PGY-3 Pager: [phone]  Phone: [phone] Date 03/02/2024  Time 11:41 AM

## 2024-03-02 NOTE — Assessment & Plan Note (Signed)
 At end of visit, patient expressed concern for weight. She has not tried increasing exercise or made any dietary changes. I encouraged her to try the Mediterranean diet to see if this was helpful and also encouraged her to increase physical activity. P: Follow-up in 4 weeks to review dietary and exercise changes. Time limited at this visit. Could consider GLP-1 at follow-up with additional time to discuss.

## 2024-03-02 NOTE — Assessment & Plan Note (Signed)
 She reports increased pain with period over last few months. Her periods remain regular but flow seems heavier. During her period she get cramping that is relieved a small amount with ibuprofen .  She has taken birth control pills in the past and she felt like she had side effects from them so would not want to try them again. P: Referral to GYN

## 2024-03-02 NOTE — Patient Instructions (Addendum)
 Thank you, [name] for allowing us  to provide your care today.   Ovarian cyst I have ordered a transvaginal ultrasound and referred you to GYN for follow-up.  Cramping Please also discuss this with GYN. Continue taking ibuprofen  for now with cramping.  Referrals ordered today:   Referral Orders         Ambulatory referral to Gynecology      I have ordered the following medication/changed the following medications:   Stop the following medications: Medications Discontinued During This Encounter  Medication Reason   ibuprofen  (ADVIL ) 800 MG tablet Reorder     Start the following medications: Meds ordered this encounter  Medications   ibuprofen  (ADVIL ) 800 MG tablet    Sig: Take 1 tablet (800 mg total) by mouth 2 (two) times daily as needed.    Dispense:  30 tablet    Refill:  0     Follow up: 1 month    We look forward to seeing you next time. Please call our clinic at [phone] if you have any questions or concerns. The [place] time to call is Monday-Friday from 9am-4pm, but there is someone available 24/7. If after hours or the weekend, call the main hospital number and ask for the Internal Medicine Resident On-Call. If you need medication refills, please notify your pharmacy one week in advance and they will send us  a request.   Thank you for trusting me with your care. Wishing you the [place]!   [name], DO [name]

## 2024-03-02 NOTE — Assessment & Plan Note (Signed)
 Patient with history of  5.5 cm right ovarian cyst found on CT abd/pelvis 3/20 with recommendations for repeat US  in 3-6 months. She does note that her sister was in her 30s when diagnosed with ovarian cancer. Also has grandmother with breast cancer. She denies night sweats or weight loss. She is unsure if pain is the same as in March. A/P: CT from March with cyst. Will need to repeat imaging to make sure size is unchanged. With her family history of ovarian cancer in a first degree relative, I do think it is appropriate for her to be seen by GYN. My question is if tumor markers are needed in her case as well. Additional would want to know if they have other recommendations to help with period cramps as she is not interested in OCP and is already taking NSAIDs.

## 2024-03-02 NOTE — Progress Notes (Signed)
 Internal Medicine Clinic Attending  Case discussed with the resident at the time of the visit.  We reviewed the resident's history and exam and pertinent patient test results.  I agree with the assessment, diagnosis, and plan of care documented in the resident's note.

## 2024-03-09 ENCOUNTER — Telehealth: Payer: Self-pay | Admitting: Internal Medicine

## 2024-03-09 NOTE — Telephone Encounter (Unsigned)
 Copied from CRM [id number]. Topic: General - Other >> Mar 09, 2024 10:38 AM Adrianna P wrote: Reason for CRM: Patient has not been seeing any claims from her insurance, so she has not been able to get her rewards. Please call [phone]

## 2024-03-10 ENCOUNTER — Ambulatory Visit: Admitting: Neurology

## 2024-03-10 ENCOUNTER — Encounter: Payer: Self-pay | Admitting: Neurology

## 2024-03-10 VITALS — BP 126/74 | HR 93 | Ht 61.0 in | Wt 221.0 lb

## 2024-03-10 DIAGNOSIS — M797 Fibromyalgia: Secondary | ICD-10-CM

## 2024-03-10 DIAGNOSIS — M7551 Bursitis of right shoulder: Secondary | ICD-10-CM

## 2024-03-10 DIAGNOSIS — M25512 Pain in left shoulder: Secondary | ICD-10-CM

## 2024-03-10 DIAGNOSIS — M3219 Other organ or system involvement in systemic lupus erythematosus: Secondary | ICD-10-CM

## 2024-03-10 DIAGNOSIS — M25511 Pain in right shoulder: Secondary | ICD-10-CM | POA: Diagnosis not present

## 2024-03-10 DIAGNOSIS — M4802 Spinal stenosis, cervical region: Secondary | ICD-10-CM

## 2024-03-10 DIAGNOSIS — G8929 Other chronic pain: Secondary | ICD-10-CM | POA: Diagnosis not present

## 2024-03-10 DIAGNOSIS — M7552 Bursitis of left shoulder: Secondary | ICD-10-CM

## 2024-03-10 DIAGNOSIS — G932 Benign intracranial hypertension: Secondary | ICD-10-CM

## 2024-03-10 MED ORDER — METHYLPREDNISOLONE ACETATE 80 MG/ML IJ SUSP
80.0000 mg | Freq: Once | INTRAMUSCULAR | Status: AC
  Administered 2024-03-10: 80 mg via INTRAMUSCULAR

## 2024-03-10 MED ORDER — BUPIVACAINE HCL 0.5 % IJ SOLN: 4.0000 mL | Freq: Once | INTRAMUSCULAR | Status: AC

## 2024-03-10 MED ORDER — ETODOLAC 400 MG PO TABS: 400.0000 mg | ORAL_TABLET | Freq: Two times a day (BID) | ORAL | 5 refills | Status: DC

## 2024-03-10 MED ORDER — TIZANIDINE HCL 4 MG PO TABS: 4.0000 mg | ORAL_TABLET | Freq: Three times a day (TID) | ORAL | 5 refills | Status: AC

## 2024-03-10 NOTE — Addendum Note (Signed)
 Addended by: Fredi January on: 03/10/2024 04:32 PM   Modules accepted: Orders

## 2024-03-10 NOTE — Telephone Encounter (Signed)
 Attn: Rcom, Hme, CMA    Pt. Diana Huynh is calling back to ask if you had asked the doctor if she could prescribe a medication to help stop smoking.   Please advise

## 2024-03-10 NOTE — Telephone Encounter (Signed)
 I contacted pt back regarding claim. Pt states it's for [employer] visit. Pt states there is no forms for the doctor to complete. It's for a visit that she had with Dr. Jannice Mends on 6/2. Will forward message to Arzella Laurence, our Research officer, political party to check on this for her.

## 2024-03-10 NOTE — Progress Notes (Signed)
 [facility]  PATIENT: Diana Huynh DOB: 02/03/1981  REFERRING DOCTOR OR PCP: Onetha Bile, MD; Rayann Atway, DO; Larue Pock, MD SOURCE: Patient, notes from ophthalmology, imaging and lab reports, MRI images personally reviewed.  _________________________________   HISTORICAL  CHIEF COMPLAINT:  Chief Complaint  Patient presents with   RM10/NEUROPATHY    Pt is here with her Husband. Pt states that she has numbness and tingling from her shoulder down to her hands and fingers. Pt states that she has numbness in her feet and toes. Pt states that the bottom of her foot has a pins and needles feeling. Pt states that she has been on infusions and it is not helping. Pt needs refill on Fioricet.     HISTORY OF PRESENT ILLNESS:  Diana Huynh is a 43 y.o. 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  Pain is worse if she puts pressure on the elbow region.    Symptoms started abut a year ago but have worsened.   She sees Dr. Thomasene Flemings (Rheumatology) for SLE.  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     She also has neck pain.     She has known spinal stenosis at C4-C5  in her cervical spine.   She had a TPI without benefit.    She is on Lyrica and duloxetine  for a while and tizanidine was recently started   A steroid pack had not helped.  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   Acetazolamide  has helped some.  HA worse if she gets up quickly.   LP was performed showing an elevated pressure of 35 cm.   She sees Dr. Mason Sole [facility]).  She saw him on Monday.  She has not noticing any recent pulsatile tinnitus.  She feels vision is doing about the same as the last visit.  The left eye is worse on the right.   She remains on Diamox  500 mg twice a day.   She has tolerated fairly well.    Notes from Dr. Mason Sole earier 2025 showed  Visual fields were similar to the prior  visit.  OCT was read as normal on the right and just slightly abnormal on the left.   History of IIH She noted headaches and visual blurring around April 2024.  Initially, symptoms were mild but she felt that they worsened in May and June.[name]  She wakes up with a bad HA since June 2024.  Headaches are bilateral with a pounding quality.  Initially she was felt to be having a lupus flare.   She saw optometry for her annular Plaquenil  exam (to assess for macular edema) and then was referred to Dr. Larue Pock (Ophthalmology) due to bilateral optic disc edema.   This exam confirmed the optic disc edema.  Additional testing showed she had reduced visual fields bilaterally.   VA was 20/25-1 each eye.   IOP was normal.      She went to the ED for further evaluation.  She had MRI/MRV that were normal.  Of note, no empty sella is noted.   Optic nerve sheaths are normal diameter.   No thrombosis noted.  She was given a prescription for butalbital .   She feels that the butalbital  has helped the headaches.  She had an LP and OP was  elevated at 35 cm.   Diamox  500 mg po bid was started.     Other medical issues: She has SLE with dsDNA Ab = 51 (0-9)    She is on Plaquenil .    She sees Dr. Thomasene Flemings at [facility].     She sees Pain Management in [facility] (Dr. Marion Sicilian) and reports she will be having a cervical ESI.    She snores but has no witnessed OSA.      Imaging: MRI of the brain 05/08/2023 was normal.  MRI of the orbits 05/08/2023 was normal.  MR venogram of the head 05/08/2023 showed mild asymmetry of the transverse sinuses but there did not appear to be any thrombosis.  MRI cervical spine 101/2022 showed disc protrusion at C4-C5 with resultant mild spinal stenosis.   Milder DDD at [facility] and C5C6.   Foci in parotid glands were also seen.      REVIEW OF SYSTEMS: Constitutional: No fevers, chills, sweats, or change in  appetite Eyes: As above\ Ear, nose and throat: No hearing loss, ear pain, nasal congestion, sore throat Cardiovascular: No chest pain, palpitations Respiratory:  No shortness of breath at rest or with exertion.   No wheezes GastrointestinaI: No nausea, vomiting, diarrhea, abdominal pain, fecal incontinence Genitourinary:  No dysuria, urinary retention or frequency.  No nocturia. Musculoskeletal: She reports a lot of joint pain and muscle pain Integumentary: No rash, pruritus, skin lesions Neurological: as above Psychiatric: No depression at this time.  No anxiety Endocrine: No palpitations, diaphoresis, change in appetite, change in weigh or increased thirst Hematologic/Lymphatic:  No anemia, purpura, petechiae. Allergic/Immunologic: No itchy/runny eyes, nasal congestion, recent allergic reactions, rashes  ALLERGIES: No Known Allergies  HOME MEDICATIONS:  Current Outpatient Medications:    acetaZOLAMIDE  ER (DIAMOX ) 500 MG capsule, Take 1 capsule (500 mg total) by mouth 2 (two) times daily., Disp: 180 capsule, Rfl: 3   albuterol  (VENTOLIN  HFA) 108 (90 Base) MCG/ACT inhaler, Inhale 1-2 puffs into the lungs every 6 (six) hours as needed for wheezing or shortness of breath., Disp: 8 g, Rfl: 0   amLODipine (NORVASC) 5 MG tablet, Take 5 mg by mouth daily., Disp: , Rfl:    butalbital -acetaminophen -caffeine  (FIORICET) 50-325-40 MG tablet, Take 1-2 tablets by mouth every 6 (six) hours as needed for headache., Disp: 20 tablet, Rfl: 0   DULoxetine  (CYMBALTA ) 60 MG capsule, Take 60 mg by mouth every evening., Disp: , Rfl:    etodolac (LODINE) 400 MG tablet, Take 1 tablet (400 mg total) by mouth 2 (two) times daily., Disp: 60 tablet, Rfl: 5   famotidine  (PEPCID ) 20 MG tablet, Take 20 mg by mouth daily. , Disp: , Rfl:    hydroxychloroquine  (PLAQUENIL ) 200 MG tablet, Take 400 mg by mouth daily., Disp: , Rfl:    ibuprofen  (ADVIL ) 800 MG tablet, Take 1 tablet (800 mg total) by mouth 2 (two) times daily  as needed., Disp: 30 tablet, Rfl: 0   pregabalin (LYRICA) 150 MG capsule, Take 150 mg by mouth 2 (two) times daily., Disp: , Rfl:    triamcinolone  (KENALOG ) 0.025 % ointment, Apply 1 Application topically 2 (two) times daily., Disp: 30 g, Rfl: 0   tiZANidine (ZANAFLEX) 4 MG tablet, Take 1 tablet (4 mg total) by mouth 3 (three) times daily., Disp: 90 tablet, Rfl: 5  PAST MEDICAL HISTORY: Past Medical History:  Diagnosis Date   Bronchitis    Fibromyalgia    Lupus    Pericarditis    per pt report   Pleuritis  12/31/2017    PAST SURGICAL HISTORY: Past Surgical History:  Procedure Laterality Date   ANKLE SURGERY     APPENDECTOMY     CESAREAN SECTION      FAMILY HISTORY: Family History  Problem Relation Age of Onset   Hypertension Mother    Heart murmur Mother    Healthy Father    Thyroid  disease Sister    Heart failure Sister        has ICD   Cervical cancer Sister    Breast cancer Paternal Aunt    Breast cancer Paternal Grandmother    Neuropathy Neg Hx     SOCIAL HISTORY: Social History   Socioeconomic History   Marital status: Married    Spouse name: Not on file   Number of children: Not on file   Years of education: Not on file   Highest education level: Not on file  Occupational History   Not on file  Tobacco Use   Smoking status: Every Day    Current packs/day: 0.00    Types: Cigarettes    Last attempt to quit: 09/20/2018    Years since quitting: 5.4   Smokeless tobacco: Never   Tobacco comments:    4-5 cigs/day  Vaping Use   Vaping status: Former  Substance and Sexual Activity   Alcohol use: Not Currently   Drug use: No   Sexual activity: Yes  Other Topics Concern   Not on file  Social History Narrative   Caffiene coffee 2-4 cups daily.    Working:  none   Husband  with 2 grown kids.    Social Drivers of Corporate investment banker Strain: Low Risk  (09/06/2022)   Overall Financial Resource Strain (CARDIA)    Difficulty of Paying Living  Expenses: Not very hard  Food Insecurity: Food Insecurity Present (04/19/2023)   Hunger Vital Sign    Worried About Running Out of Food in the Last Year: Sometimes true    Ran Out of Food in the Last Year: Sometimes true  Transportation Needs: No Transportation Needs (04/19/2023)   PRAPARE - Administrator, Civil Service (Medical): No    Lack of Transportation (Non-Medical): No  Physical Activity: Not on file  Stress: Not on file  Social Connections: Socially Isolated (09/06/2022)   Social Connection and Isolation Panel [NHANES]    Frequency of Communication with Friends and Family: Never    Frequency of Social Gatherings with Friends and Family: Never    Attends Religious Services: Never    Database administrator or Organizations: No    Attends Banker Meetings: Never    Marital Status: Married  Catering manager Violence: Not At Risk (09/06/2022)   Humiliation, Afraid, Rape, and Kick questionnaire    Fear of Current or Ex-Partner: No    Emotionally Abused: No    Physically Abused: No    Sexually Abused: No       PHYSICAL EXAM  Vitals:   03/10/24 1325  BP: 126/74  Pulse: 93  Weight: 221 lb (100.2 kg)  Height: 5\' 1"  (1.549 m)    Body mass index is 41.76 kg/m.   General: The patient is well-developed and well-nourished and in no acute distress  HEENT:  Head is [place]/AT.  Sclera are anicteric.  Funduscopic examination was normal today.  Skin: Extremities are without rash or  edema.  Musculoskeletal: She has moderate tenderness over the cervical paraspinal muscles, splenius capitis muscles, pectoralis muscles, proximal legs and other  fibromyalgia tender points.  Additionally, she is tender and has pain reduced range of motion in the shoulders.  Tenderness is over the bursae.  Neurologic Exam  Mental status: The patient is alert and oriented x 3 at the time of the examination. The patient has apparent normal recent and remote memory, with an apparently  normal attention span and concentration ability.   Speech is normal.  Cranial nerves: Extraocular movements are full. Pupils are equal, round, and reactive to light and accomodation.      There is good facial sensation to soft touch bilaterally.Facial strength is normal.   . No obvious hearing deficits are noted.  Motor:  Muscle bulk is normal.   Tone is normal. Strength is  5 / 5 in all 4 extremities.   Sensory: Sensory testing is intact to pinprick, soft touch and vibration sensation in all 4 extremities.  Coordination: Cerebellar testing reveals good finger-nose-finger and heel-to-shin bilaterally.  Gait and station: Station is normal.   Gait is arthritic . Tandem gait is mildly wide. Romberg is negative.   Reflexes: Deep tendon reflexes are symmetric and normal bilaterally.       DIAGNOSTIC DATA (LABS, IMAGING, TESTING) - I reviewed patient records, labs, notes, testing and imaging myself where available.  Lab Results  Component Value Date   WBC 8.5 12/19/2023   HGB 11.6 (L) 12/19/2023   HCT 37.5 12/19/2023   MCV 88.2 12/19/2023   PLT 303 12/19/2023      Component Value Date/Time   NA 135 12/19/2023 1951   NA 137 05/12/2020 1727   K 3.2 (L) 12/19/2023 1951   CL 108 12/19/2023 1951   CO2 18 (L) 12/19/2023 1951   GLUCOSE 110 (H) 12/19/2023 1951   BUN 9 12/19/2023 1951   BUN 9 05/12/2020 1727   CREATININE 1.03 (H) 12/19/2023 1951   CALCIUM 8.9 12/19/2023 1951   PROT 8.4 (H) 05/08/2023 1703   PROT 7.1 01/30/2018 1042   ALBUMIN 4.4 05/08/2023 1703   ALBUMIN 4.4 01/30/2018 1042   AST 20 05/08/2023 1703   ALT 18 05/08/2023 1703   ALKPHOS 47 05/08/2023 1703   BILITOT 0.4 05/08/2023 1703   BILITOT <0.2 01/30/2018 1042   GFRNONAA >60 12/19/2023 1951   GFRAA 96 05/12/2020 1727   Lab Results  Component Value Date   CHOL 119 12/27/2017   HDL 31 (L) 12/27/2017   LDLCALC 80 12/27/2017   TRIG 41 12/27/2017   CHOLHDL 3.8 12/27/2017   Lab Results  Component Value Date    HGBA1C 5.3 12/27/2017   No results found for: "VITAMINB12" Lab Results  Component Value Date   TSH 1.560 02/19/2023       ASSESSMENT AND PLAN  Cervical stenosis of spinal canal - Plan: MR CERVICAL SPINE WO CONTRAST  Chronic pain of both shoulders - Plan: methylPREDNISolone acetate (DEPO-MEDROL) injection 80 mg  Subacromial bursitis of both shoulders - Plan: methylPREDNISolone acetate (DEPO-MEDROL) injection 80 mg  Idiopathic intracranial hypertension  Fibromyalgia  Systemic lupus erythematosus with other organ involvement, unspecified SLE type (HCC)   The optic discs are normal and she will continue acetazolamide . She has pain in the neck, occiput and shoulders.  There is restricted movement in the shoulders due to pain and she is tender over the subacromial bursae bilaterally.  I did bilateral bursa injection with 80 mg Depo-Medrol in 4 cc Marcaine using sterile technique.  She tolerated the procedure well with there were no complications.  Pain was a little bit better afterwards.  She also has pain from fibromyalgia.  She will continue gabapentin  and Cymbalta . Encouraged to lose weight Rtc 8 months, sooner if new or worsening issues   Abbigal Radich A. Godwin Lat, MD, [facility] 03/10/2024, 4:09 PM Certified in Neurology, Clinical Neurophysiology, Sleep Medicine and Neuroimaging  [facility] [address], [address] [place], [place] [address] [phone]

## 2024-03-11 ENCOUNTER — Telehealth: Payer: Self-pay | Admitting: *Deleted

## 2024-03-11 MED ORDER — VARENICLINE TARTRATE 0.5 MG PO TABS: ORAL_TABLET | ORAL | 0 refills | Status: AC

## 2024-03-11 NOTE — Telephone Encounter (Signed)
 Will forward to PCP. Copied from CRM [id number]. Topic: General - Other >> Mar 11, 2024 10:37 AM Diana Huynh wrote: Reason for CRM: Patient is calling because she is wanting the status of her request for medicine to help her stop smoking, I informed the patient that the message was sent to the provider and we are just waiting on a response, patient would like a call back when the provider makes the decision, patients callback number is [phone]

## 2024-03-11 NOTE — Telephone Encounter (Signed)
 Patient wanted provider to call her to discuss smoking cessation. She is interested in starting a medication to help her quit, as she continues to smoke ~0.5 ppd (has smoked cigarettes on and off for about 20 years).   We discussed starting Chantix - she will take 0.5 mg daily days 1-3, 0.5 mg bid days 4-7 and then 1 mg bid thereafter. Could increase to 1 mg in morning and 2 mg at bedtime if no response in 4-6 weeks.  We also discussed that the Farver approach to be successful with tobacco cessation would be to choose a fixed quite date (start chantix today, and quit in 1 week) or a gradual quit date (start chantix today and reduce smoking by 50% by week 2, etc). Also gave the patient information on 1-800-QUIT-NOW. She will call to make a follow up appt in 4 weeks.

## 2024-03-12 ENCOUNTER — Telehealth: Payer: Self-pay | Admitting: *Deleted

## 2024-03-12 ENCOUNTER — Encounter: Payer: Self-pay | Admitting: Neurology

## 2024-03-12 NOTE — Telephone Encounter (Signed)
 Pt said medication needs PA

## 2024-03-13 ENCOUNTER — Telehealth: Payer: Self-pay

## 2024-03-13 ENCOUNTER — Other Ambulatory Visit (HOSPITAL_COMMUNITY): Payer: Self-pay

## 2024-03-13 NOTE — Telephone Encounter (Signed)
 Pharmacy Patient Advocate Encounter   Received notification from Physician's Office that prior authorization for Etodolac  400MG  tablets is required/requested.   Insurance verification completed.   The patient is insured through [facility] .   Per test claim:  see preferred list below from [facility] formulary for 2025 is preferred by the insurance.  If suggested medication is appropriate, Please send in a new RX and discontinue this one. If not, please advise as to why it's not appropriate so that we may request a Prior Authorization. Please note, some preferred medications may still require a PA.  If the suggested medications have not been trialed and there are no contraindications to their use, the PA will not be submitted, as it will not be approved.    I see that PT has tried Ibuprofen  but nothing else on the preferred list per chart. Please advise-Thank!

## 2024-03-16 ENCOUNTER — Other Ambulatory Visit: Payer: Self-pay | Admitting: Neurology

## 2024-03-16 MED ORDER — MELOXICAM 15 MG PO TABS
15.0000 mg | ORAL_TABLET | Freq: Every day | ORAL | 5 refills | Status: DC
Start: 2024-03-16 — End: 2024-03-16

## 2024-03-16 MED ORDER — SULINDAC 200 MG PO TABS: 200.0000 mg | ORAL_TABLET | Freq: Two times a day (BID) | ORAL | 5 refills | Status: DC

## 2024-03-16 NOTE — Telephone Encounter (Signed)
 Dr.Sater please see the preferred drug list below and advise. Thanks!

## 2024-03-16 NOTE — Telephone Encounter (Signed)
Sent mychart to pt with update.

## 2024-03-16 NOTE — Telephone Encounter (Signed)
 Called [phone] and the number states it is no longer in service.  Copied from CRM [id number]. Topic: General - Other >> Mar 16, 2024  2:39 PM Retta Caster wrote: Reason for CRM: Crystal from [facility] [phone]/[email or web address] -Patient is trying to be assigned to PCP be system is not letting them do so. Last visit 03/02/24. Needs to be assigned to new location. They will assign to old location. Fyi for office needs to be updated for them to assign PCP

## 2024-03-17 ENCOUNTER — Other Ambulatory Visit: Payer: Self-pay | Admitting: Family Medicine

## 2024-03-17 ENCOUNTER — Ambulatory Visit (HOSPITAL_COMMUNITY)
Admission: RE | Admit: 2024-03-17 | Discharge: 2024-03-17 | Disposition: A | Discharge status: Home / Self Care | Source: Ambulatory Visit | Attending: Family Medicine | Admitting: Family Medicine

## 2024-03-17 DIAGNOSIS — N83291 Other ovarian cyst, right side: Secondary | ICD-10-CM | POA: Diagnosis not present

## 2024-03-17 DIAGNOSIS — N83201 Unspecified ovarian cyst, right side: Secondary | ICD-10-CM | POA: Insufficient documentation

## 2024-03-17 DIAGNOSIS — N946 Dysmenorrhea, unspecified: Secondary | ICD-10-CM

## 2024-03-20 ENCOUNTER — Ambulatory Visit: Payer: Self-pay | Admitting: Internal Medicine

## 2024-03-23 ENCOUNTER — Telehealth: Payer: Self-pay | Admitting: Neurology

## 2024-03-23 DIAGNOSIS — M7918 Myalgia, other site: Secondary | ICD-10-CM | POA: Diagnosis not present

## 2024-03-23 DIAGNOSIS — G894 Chronic pain syndrome: Secondary | ICD-10-CM | POA: Diagnosis not present

## 2024-03-23 DIAGNOSIS — M328 Other forms of systemic lupus erythematosus: Secondary | ICD-10-CM | POA: Diagnosis not present

## 2024-03-23 DIAGNOSIS — M4802 Spinal stenosis, cervical region: Secondary | ICD-10-CM | POA: Diagnosis not present

## 2024-03-23 NOTE — Telephone Encounter (Signed)
 The cervical MRI is being denied by her insurance stating: Your doctor told us  that you have neck pain. Your doctor ordered an MRI of your neck. An MRI is a way to take pictures of the inside of your body. This test should be used when the pain has not improved after six weeks of treatment by your doctor. Treatment should include medications and other forms of therapy. These need to include home exercises or physical therapy. We reviewed the notes we have. The notes do not show that you have had at least six weeks of such treatment. The notes do not show that there is a reason you cannot have such treatment. Based on the information we have, this test is not medically necessary. We used [employer] Medical Benefits Management Clinical Guideline titled Imaging of the Spine to make this decision. You may view this guideline at www.[employer].com/mbm-guidelines-radiology.  A reconsideration may be requested within seven business days (after 6/19) of adverse determination. To request a reconsideration, please call [employer] at [phone]. Case #[id number]

## 2024-03-26 ENCOUNTER — Other Ambulatory Visit: Payer: Self-pay | Admitting: *Deleted

## 2024-03-26 DIAGNOSIS — M4802 Spinal stenosis, cervical region: Secondary | ICD-10-CM

## 2024-03-26 DIAGNOSIS — G8929 Other chronic pain: Secondary | ICD-10-CM

## 2024-03-26 DIAGNOSIS — M7551 Bursitis of right shoulder: Secondary | ICD-10-CM

## 2024-03-27 ENCOUNTER — Ambulatory Visit (HOSPITAL_COMMUNITY)

## 2024-03-30 ENCOUNTER — Telehealth: Payer: Self-pay | Admitting: Neurology

## 2024-03-30 NOTE — Telephone Encounter (Signed)
 Referral to Physical Therapy Faxed to [facility]  [facility])  [facility]  [facility]) Phone# [phone] Fax# [phone]

## 2024-03-31 DIAGNOSIS — M3219 Other organ or system involvement in systemic lupus erythematosus: Secondary | ICD-10-CM | POA: Diagnosis not present

## 2024-03-31 NOTE — Telephone Encounter (Signed)
 [facility] faxed a request for neurologist signature on referral on 03/30/24. Put neurologist mailbox with the nurse. Have faxed back with neurologist's signature on 03/31/24

## 2024-04-10 ENCOUNTER — Ambulatory Visit: Payer: Self-pay | Admitting: Student

## 2024-04-10 VITALS — BP 126/82 | HR 99 | Ht 61.0 in | Wt 217.8 lb

## 2024-04-10 DIAGNOSIS — Z6841 Body Mass Index (BMI) 40.0 and over, adult: Secondary | ICD-10-CM

## 2024-04-10 DIAGNOSIS — M3219 Other organ or system involvement in systemic lupus erythematosus: Secondary | ICD-10-CM

## 2024-04-10 DIAGNOSIS — M797 Fibromyalgia: Secondary | ICD-10-CM | POA: Diagnosis not present

## 2024-04-10 DIAGNOSIS — G4733 Obstructive sleep apnea (adult) (pediatric): Secondary | ICD-10-CM

## 2024-04-10 NOTE — Patient Instructions (Signed)
 Thank you, [name] for allowing us  to provide your care today. Today we discussed:  - I sent a referral to sleep study   - You can take Tylenol  500 mg, 2 tablets every 6-8 hours   I have ordered the following labs for you:  Lab Orders  No laboratory test(s) ordered today     Tests ordered today:  None   Referrals ordered today:   Referral Orders  No referral(s) requested today     I have ordered the following medication/changed the following medications:   Stop the following medications: Medications Discontinued During This Encounter  Medication Reason   ibuprofen  (ADVIL ) 800 MG tablet      Start the following medications: No orders of the defined types were placed in this encounter.    Follow up: 2 months Weight management    Remember:   Should you have any questions or concerns please call the internal medicine clinic at [phone].     Diana Huynh, D.O. [facility]

## 2024-04-10 NOTE — Progress Notes (Unsigned)
   Established Patient Office Visit  Subjective   Patient ID: Diana Huynh, female    DOB: Jan 04, 1981  Age: 43 y.o. MRN: [id number]  Chief Complaint  Patient presents with  . Pain    Chronic pain multiple sites (legs back, arms) Tingling in fingers   . unexplained bruising    HPI  This is a 43 year old female with past medical history of fibromyalgia, SLE, intracranial hypertension, tobacco use disorder, 2 simple cysts on right ovaries, menorrhagia, presents today for a 1 month follow-up on medication review.  Last office visit 03/02/2024.  ROS   As per assessment and plan Objective:     BP 126/82 (BP Location: Left Arm, Patient Position: Sitting, Cuff Size: Normal)   Pulse 99   Ht 5' 1 (1.549 m)   Wt 217 lb 12.8 oz (98.8 kg)   SpO2 98%   BMI 41.15 kg/m  BP Readings from Last 3 Encounters:  04/10/24 126/82  03/10/24 126/74  03/02/24 125/63   Wt Readings from Last 3 Encounters:  04/10/24 217 lb 12.8 oz (98.8 kg)  03/10/24 221 lb (100.2 kg)  03/02/24 224 lb 9.6 oz (101.9 kg)      Physical Exam   No results found for any visits on 04/10/24.  {Labs (Optional):23779}  The ASCVD Risk score (Arnett DK, et al., 2019) failed to calculate for the following reasons:   Cannot find a previous HDL lab   Cannot find a previous total cholesterol lab    Assessment & Plan:  Fibromyalgia HX of SLE  Reports flare, - Reports normally 6-8 pain level,  - 2 days ago, her pain is now 10/10. Reports muscle spasms and aches in her bilateral legs, toes and hands, shoulders and back. Reports tingling in her fingers.  - No recent illness, cough, congestion, sore throat, nausea or vomiting. No new rashes.  - Takes Lyrica 150 TID, Tizanidine  4 mg TID, Sulindac  200 BID- helps with inflammation and headache. Cymbalta  60 mg daily.  - Pain management clinic 03/23/2024 - cervicalgia and cervical stenosis   SLE - Tafinalo infusion, last infusion July 1st, every 4 weeks and Plaquenil  400 mg a  day  - Follows Dr Mai, rheumatologist  Obesity Morbid obesity with BMI Per the last office visit, she was encouraged on dietary - mediterranean diet and leafy vegetables, small portion and exercise changes - None, limited by body aches and leg pain. - ?OSA - snores. Never sleep apnea.  - Zepbound (GLP 1 medication)   Problem List Items Addressed This Visit   None   No follow-ups on file.    Toma Edwards, DO

## 2024-04-13 NOTE — Assessment & Plan Note (Signed)
 Follows Dr. Mai, rheumatologist.  Reports that she gets infusion every 4 weeks, last infusion July 1 and currently taking daily Plaquenil  400 mg. -Advised to continue following up with rheumatologist

## 2024-04-13 NOTE — Assessment & Plan Note (Signed)
 Patient reports that she has tried dietary modification with Mediterranean diet, leafy vegetables, eating small portions of meals throughout the day.  Reports that she has been unable to exercise due to body aches and pain in her lower extremity.  States that she is interested in starting medication for weight loss.  She also notes that she snores at night, has never been evaluated for sleep apnea. -Referral for sleep study sent, will await result, if qualifies for sleep apnea then will consider adding Zepbound

## 2024-04-13 NOTE — Assessment & Plan Note (Signed)
 Patient has a well-known history of fibromyalgia and lupus, reports that she is having a flareup.  Patient reports her symptoms started about 2 days ago, normally her pain level is between 6-8, now 10 out of 10.  Reports muscle spasm and aches in her bilateral legs, toes, hands, shoulders, back (everywhere).  Reports tingling sensation in her fingers.  Denies any recent illnesses, cough, congestion, sore throat, nausea, vomiting.  No new rashes.  Patient reports that she is taking Lyrica 150 mg 3 times daily, tizanidine  4 mg 3 times daily, sulindac  200 mg twice daily, Cymbalta  60 mg daily.  Reports that she follows pain management clinic, last office visit 03/23/2024 for cervicalgia and cervical stenosis.  Patient reports that even with these medications, she is having generalized body aches.  - Counseling provided -Advised to follow-up with rheumatologist and plain clinic -Advised to continue current regimen -Counseled on adding Tylenol  500 mg, 2 tablets every 6-8 hours in addition to current medication.

## 2024-04-14 ENCOUNTER — Ambulatory Visit
Admission: RE | Admit: 2024-04-14 | Discharge: 2024-04-14 | Disposition: A | Discharge status: Home / Self Care | Source: Ambulatory Visit | Attending: Internal Medicine | Admitting: Internal Medicine

## 2024-04-14 DIAGNOSIS — R92323 Mammographic fibroglandular density, bilateral breasts: Secondary | ICD-10-CM | POA: Diagnosis not present

## 2024-04-14 DIAGNOSIS — N6325 Unspecified lump in the left breast, overlapping quadrants: Secondary | ICD-10-CM | POA: Diagnosis not present

## 2024-04-14 DIAGNOSIS — N6320 Unspecified lump in the left breast, unspecified quadrant: Secondary | ICD-10-CM

## 2024-04-21 ENCOUNTER — Other Ambulatory Visit: Payer: Self-pay

## 2024-04-21 ENCOUNTER — Other Ambulatory Visit: Payer: Self-pay | Admitting: Neurology

## 2024-04-21 ENCOUNTER — Encounter: Payer: Self-pay | Admitting: Neurology

## 2024-04-21 MED ORDER — BUTALBITAL-APAP-CAFFEINE 50-325-40 MG PO TABS: 1.0000 | ORAL_TABLET | Freq: Four times a day (QID) | ORAL | 0 refills | Status: DC | PRN

## 2024-04-21 NOTE — Progress Notes (Signed)
 Internal Medicine Clinic Attending  Case discussed with the resident at the time of the visit.  We reviewed the resident's history and exam and pertinent patient test results.  I agree with the assessment, diagnosis, and plan of care documented in the resident's note.

## 2024-04-22 DIAGNOSIS — M328 Other forms of systemic lupus erythematosus: Secondary | ICD-10-CM | POA: Diagnosis not present

## 2024-04-22 DIAGNOSIS — M797 Fibromyalgia: Secondary | ICD-10-CM | POA: Diagnosis not present

## 2024-04-22 DIAGNOSIS — G629 Polyneuropathy, unspecified: Secondary | ICD-10-CM | POA: Diagnosis not present

## 2024-04-22 DIAGNOSIS — Z79624 Long term (current) use of inhibitors of nucleotide synthesis: Secondary | ICD-10-CM | POA: Diagnosis not present

## 2024-04-22 DIAGNOSIS — I7300 Raynaud's syndrome without gangrene: Secondary | ICD-10-CM | POA: Diagnosis not present

## 2024-04-22 DIAGNOSIS — G932 Benign intracranial hypertension: Secondary | ICD-10-CM | POA: Diagnosis not present

## 2024-04-22 DIAGNOSIS — Z79620 Long term (current) use of immunosuppressive biologic: Secondary | ICD-10-CM | POA: Diagnosis not present

## 2024-04-22 DIAGNOSIS — Z79899 Other long term (current) drug therapy: Secondary | ICD-10-CM | POA: Diagnosis not present

## 2024-04-24 DIAGNOSIS — Z79620 Long term (current) use of immunosuppressive biologic: Secondary | ICD-10-CM | POA: Diagnosis not present

## 2024-04-24 DIAGNOSIS — Z79899 Other long term (current) drug therapy: Secondary | ICD-10-CM | POA: Diagnosis not present

## 2024-04-24 DIAGNOSIS — M328 Other forms of systemic lupus erythematosus: Secondary | ICD-10-CM | POA: Diagnosis not present

## 2024-04-24 DIAGNOSIS — Z79624 Long term (current) use of inhibitors of nucleotide synthesis: Secondary | ICD-10-CM | POA: Diagnosis not present

## 2024-04-28 DIAGNOSIS — M3219 Other organ or system involvement in systemic lupus erythematosus: Secondary | ICD-10-CM | POA: Diagnosis not present

## 2024-05-26 DIAGNOSIS — M3219 Other organ or system involvement in systemic lupus erythematosus: Secondary | ICD-10-CM | POA: Diagnosis not present

## 2024-06-12 ENCOUNTER — Encounter: Payer: Self-pay | Admitting: Student

## 2024-06-12 ENCOUNTER — Telehealth: Payer: Self-pay

## 2024-06-12 ENCOUNTER — Ambulatory Visit: Admitting: Student

## 2024-06-12 VITALS — BP 124/72 | HR 77 | Temp 98.3°F | Ht 61.0 in | Wt 216.0 lb

## 2024-06-12 DIAGNOSIS — R7302 Impaired glucose tolerance (oral): Secondary | ICD-10-CM | POA: Diagnosis not present

## 2024-06-12 DIAGNOSIS — F17210 Nicotine dependence, cigarettes, uncomplicated: Secondary | ICD-10-CM

## 2024-06-12 DIAGNOSIS — M3219 Other organ or system involvement in systemic lupus erythematosus: Secondary | ICD-10-CM

## 2024-06-12 DIAGNOSIS — M329 Systemic lupus erythematosus, unspecified: Secondary | ICD-10-CM

## 2024-06-12 DIAGNOSIS — G932 Benign intracranial hypertension: Secondary | ICD-10-CM | POA: Diagnosis not present

## 2024-06-12 DIAGNOSIS — G4733 Obstructive sleep apnea (adult) (pediatric): Secondary | ICD-10-CM

## 2024-06-12 DIAGNOSIS — Z79620 Long term (current) use of immunosuppressive biologic: Secondary | ICD-10-CM

## 2024-06-12 DIAGNOSIS — Z13220 Encounter for screening for lipoid disorders: Secondary | ICD-10-CM

## 2024-06-12 DIAGNOSIS — G629 Polyneuropathy, unspecified: Secondary | ICD-10-CM | POA: Diagnosis not present

## 2024-06-12 DIAGNOSIS — Z79899 Other long term (current) drug therapy: Secondary | ICD-10-CM | POA: Diagnosis not present

## 2024-06-12 DIAGNOSIS — Z0000 Encounter for general adult medical examination without abnormal findings: Secondary | ICD-10-CM

## 2024-06-12 DIAGNOSIS — Z131 Encounter for screening for diabetes mellitus: Secondary | ICD-10-CM

## 2024-06-12 DIAGNOSIS — Z6841 Body Mass Index (BMI) 40.0 and over, adult: Secondary | ICD-10-CM

## 2024-06-12 MED ORDER — TIRZEPATIDE-WEIGHT MANAGEMENT 2.5 MG/0.5ML ~~LOC~~ SOLN: 2.5000 mg | SUBCUTANEOUS | 3 refills | Status: DC

## 2024-06-12 NOTE — Assessment & Plan Note (Addendum)
 patient has been committed to lifestyle changes and will continue to exercise and make diet modifications. She has a history of SLE, fribromyalgia, and servical stenosis. Would benefit of medical therapies known to help with weight loss to reduce morbidity and mortality.  Patient willing to try Tizerpatide today - Start with 2.5 mg weekly - Precautions given - RTC in one month after starting therapy - Will follow up PA authorization from [facility].

## 2024-06-12 NOTE — Telephone Encounter (Signed)
 Rec'd PA request for patients Zepbound .   Patient will need documentation of a sleep study to submit with PA.    Per medicaid:

## 2024-06-12 NOTE — Assessment & Plan Note (Signed)
 Patient reports worsening of glove stocking pattern neuropathic type pain. This is worst in her feet. Some of it is positional (felt on both of her heels, which goes away), but the glove and stocking like pain is present. Unlike her raynauds pain.  Reviewed medicati: Noons. At risk with plaquenil ; this is a chronic medication. No recent increases. No recent A1c or B12 screening. Unable to do B6 today ([facility] not in house); will need to check on follow up  Patient on Lyrica and Cymbalta  already. Will check: B12 and A1c today; will treat if abnormal Recommended to discusse with rheumatologist

## 2024-06-12 NOTE — Progress Notes (Signed)
 Internal Medicine Clinic Attending  Case discussed with the resident at the time of the visit.  We reviewed the resident's history and exam and pertinent patient test results.  I agree with the assessment, diagnosis, and plan of care documented in the resident's note.

## 2024-06-12 NOTE — Assessment & Plan Note (Signed)
 Has been well controlled. Currently with Anifrolumab -fnia infusions every 4 weeks.  Interferon alpha inhibitor. Added Azathioprine 50 mg daily. Also on Plaquenil  40 mg daily. Had an eye exam yesterday without overt abnormalities.  - No changes

## 2024-06-12 NOTE — Assessment & Plan Note (Signed)
 Managed by Neurology. On Diamox . Eye exam with reversal of optic nerve edema per patient; will await documentation fax from ophthalmology.   Discussed weight management. Being treated today

## 2024-06-12 NOTE — Assessment & Plan Note (Signed)
A1c and Lipid panel today

## 2024-06-12 NOTE — Progress Notes (Signed)
 Subjective:  CC: Chronic condition follow up  HPI:  [name] is a 43 y.o. female with a past medical history stated below and presents today for chronic condition follow up. Please see problem based assessment and plan for additional details.  Past Medical History:  Diagnosis Date   Bronchitis    Fibromyalgia    Lupus    Pericarditis    per pt report   Pleuritis 12/31/2017    Current Outpatient Medications on File Prior to Visit  Medication Sig Dispense Refill   acetaZOLAMIDE  ER (DIAMOX ) 500 MG capsule Take 1 capsule (500 mg total) by mouth 2 (two) times daily. 180 capsule 3   albuterol  (VENTOLIN  HFA) 108 (90 Base) MCG/ACT inhaler Inhale 1-2 puffs into the lungs every 6 (six) hours as needed for wheezing or shortness of breath. 8 g 0   amLODipine (NORVASC) 5 MG tablet Take 5 mg by mouth daily.     butalbital -acetaminophen -caffeine  (FIORICET) 50-325-40 MG tablet TAKE ONE TO TWO TABLETS BY MOUTH EVERY 6 HOURS AS NEEDED FOR HEADACHE 20 tablet 0   butalbital -acetaminophen -caffeine  (FIORICET) 50-325-40 MG tablet Take 1-2 tablets by mouth every 6 (six) hours as needed for headache. 20 tablet 0   DULoxetine  (CYMBALTA ) 60 MG capsule Take 60 mg by mouth every evening.     famotidine  (PEPCID ) 20 MG tablet Take 20 mg by mouth daily.      hydroxychloroquine  (PLAQUENIL ) 200 MG tablet Take 400 mg by mouth daily.     pregabalin (LYRICA) 150 MG capsule Take 150 mg by mouth 2 (two) times daily.     sulindac  (CLINORIL ) 200 MG tablet Take 1 tablet (200 mg total) by mouth 2 (two) times daily. 60 tablet 5   tiZANidine  (ZANAFLEX ) 4 MG tablet Take 1 tablet (4 mg total) by mouth 3 (three) times daily. 90 tablet 5   triamcinolone  (KENALOG ) 0.025 % ointment Apply 1 Application topically 2 (two) times daily. 30 g 0   Current Facility-Administered Medications on File Prior to Visit  Medication Dose Route Frequency Provider Last Rate Last Admin   bupivacaine  (MARCAINE ) 0.5 % (with pres) injection 4  mL  4 mL Infiltration Once Sater, Charlie LABOR, MD        Family History  Problem Relation Age of Onset   Hypertension Mother    Heart murmur Mother    Healthy Father    Thyroid  disease Sister    Heart failure Sister        has ICD   Cervical cancer Sister    Breast cancer Paternal Aunt    Breast cancer Paternal Grandmother    Neuropathy Neg Hx     Social History   Socioeconomic History   Marital status: Married    Spouse name: Not on file   Number of children: Not on file   Years of education: Not on file   Highest education level: Associate degree: academic program  Occupational History   Not on file  Tobacco Use   Smoking status: Every Day    Current packs/day: 0.00    Types: Cigarettes    Last attempt to quit: 09/20/2018    Years since quitting: 5.7   Smokeless tobacco: Never   Tobacco comments:    4-5 cigs/day  Vaping Use   Vaping status: Former  Substance and Sexual Activity   Alcohol use: Not Currently   Drug use: No   Sexual activity: Yes  Other Topics Concern   Not on file  Social History Narrative  Caffiene coffee 2-4 cups daily.    Working:  none   Husband  with 2 grown kids.    Social Drivers of Health   Financial Resource Strain: Medium Risk (04/10/2024)   Overall Financial Resource Strain (CARDIA)    Difficulty of Paying Living Expenses: Somewhat hard  Food Insecurity: Food Insecurity Present (04/10/2024)   Hunger Vital Sign    Worried About Running Out of Food in the Last Year: Often true    Ran Out of Food in the Last Year: Sometimes true  Transportation Needs: No Transportation Needs (04/10/2024)   PRAPARE - Administrator, Civil Service (Medical): No    Lack of Transportation (Non-Medical): No  Physical Activity: Inactive (04/10/2024)   Exercise Vital Sign    Days of Exercise per Week: 0 days    Minutes of Exercise per Session: Not on file  Stress: Stress Concern Present (04/10/2024)   [employer] of Occupational Health -  Occupational Stress Questionnaire    Feeling of Stress: Very much  Social Connections: Socially Isolated (04/10/2024)   Social Connection and Isolation Panel    Frequency of Communication with Friends and Family: Once a week    Frequency of Social Gatherings with Friends and Family: Never    Attends Religious Services: Never    Database administrator or Organizations: No    Attends Engineer, structural: Not on file    Marital Status: Married  Catering manager Violence: Not At Risk (09/06/2022)   Humiliation, Afraid, Rape, and Kick questionnaire    Fear of Current or Ex-Partner: No    Emotionally Abused: No    Physically Abused: No    Sexually Abused: No    Review of Systems: ROS negative except for what is noted on the assessment and plan.  Objective:   Vitals:   06/12/24 0852  BP: 124/72  Pulse: 77  Temp: 98.3 F (36.8 C)  TempSrc: Oral  SpO2: 100%  Weight: 216 lb (98 kg)  Height: 5' 1 (1.549 m)    Physical Exam: Constitutional: well-appearing woman sitting in chair, in no acute distress HENT: normocephalic atraumatic, mucous membranes moist Eyes: conjunctiva non-erythematous Neck: supple, increased girth Cardiovascular: regular rate and rhythm, no m/r/g, no LE edema, present and symmetric DP and PT pulses Pulmonary/Chest: normal work of breathing on room air, lungs clear to auscultation bilaterally Abdominal: soft, non-tender, protuberant MSK: normal bulk and tone, point tenderness throughout Neurological: alert & oriented x 3 Skin: warm and dry Psych: Pleasant mood and affect     Assessment & Plan:   SLE (systemic lupus erythematosus) (HCC) Has been well controlled. Currently with Anifrolumab -fnia infusions every 4 weeks.  Interferon alpha inhibitor. Added Azathioprine 50 mg daily. Also on Plaquenil  40 mg daily. Had an eye exam yesterday without overt abnormalities.  - No changes   Neuropathy Patient reports worsening of glove stocking pattern  neuropathic type pain. This is worst in her feet. Some of it is positional (felt on both of her heels, which goes away), but the glove and stocking like pain is present. Unlike her raynauds pain.  Reviewed medicati: Noons. At risk with plaquenil ; this is a chronic medication. No recent increases. No recent A1c or B12 screening. Unable to do B6 today ([facility] not in house); will need to check on follow up  Patient on Lyrica and Cymbalta  already. Will check: B12 and A1c today; will treat if abnormal Recommended to discusse with rheumatologist  Idiopathic intracranial hypertension Managed by Neurology. On  Diamox . Eye exam with reversal of optic nerve edema per patient; will await documentation fax from ophthalmology.   Discussed weight management. Being treated today  Obesity, morbid, BMI 40.0-49.9 (HCC) patient has been committed to lifestyle changes and will continue to exercise and make diet modifications. She has a history of SLE, fribromyalgia, and servical stenosis. Would benefit of medical therapies known to help with weight loss to reduce morbidity and mortality.  Patient willing to try Tizerpatide today - Start with 2.5 mg weekly - Precautions given - RTC in one month after starting therapy - Will follow up PA authorization from [facility].  Healthcare maintenance A1c and Lipid panel today  OSA (obstructive sleep apnea) Historic diagnosis. No sleep studies on file. STOP BANG score of 6.  - referral to sleep studies    Return in about 3 months (around 09/11/2024) for one month after starting GLP1 (call) and 3 months for chronic medical conditions.  Patient discussed with Dr. Shawn Hadassah Kristy Rosario, MD [facility]  06/12/2024, 10:50 AM

## 2024-06-12 NOTE — Assessment & Plan Note (Signed)
 Historic diagnosis. No sleep studies on file. STOP BANG score of 6.  - referral to sleep studies

## 2024-06-12 NOTE — Patient Instructions (Addendum)
 Thank you, Ms.Isbella Tabone for allowing us  to provide your care today. Today we discussed   Your lupus, your blood pressure, screening for medical conditions, sleep apnea, and weight  You have been started on a medications called Tizerpatide. You should inject 2.5 mg weekly. Please stay hydrated and eat smaller meals while you are on this medication. Monitor yourself for signs of: -Nausea, vomiting, bloating, diarrhea, or abdominal discomfort.  -Most of the effects should get better as you continue using this medication -If it does not improve in 2-3 days or you have poor oral intake, experience lightheadedness, or feel sick, please STOP the medication and call our office.   If you experience no side effects/tolerate this therapy, we may be able to increase your dose at the next follow up visit   It may take a while for the insurance to approve it. WE will be in touch if we need to change the medication for insurance approval   Referrals: - sleep studies  - eye doctor    I have ordered the following labs for you:  Lab Orders         Lipid Profile         Hemoglobin A1c         Vitamin B12      I will call if any are abnormal. All of your labs can be accessed through My Chart.   My Chart Access: [email or web address]  Please follow-up in: one month once you have your weight loss medication    We look forward to seeing you next time. Please call our clinic at [phone] if you have any questions or concerns. The [place] time to call is Monday-Friday from 9am-4pm, but there is someone available 24/7. If after hours or the weekend, call the main hospital number and ask for the Internal Medicine Resident On-Call. If you need medication refills, please notify your pharmacy one week in advance and they will send us  a request.   Thank you for letting us  take part in your care. Wishing you the [place]!  Elnora Ip, MD 06/12/2024, 9:32  AM [facility]

## 2024-06-13 LAB — LIPID PANEL
Chol/HDL Ratio: 4 ratio (ref 0.0–4.4)
Cholesterol, Total: 171 mg/dL (ref 100–199)
HDL: 43 mg/dL (ref >39)
LDL Chol Calc (NIH): 116 mg/dL — ABNORMAL HIGH (ref 0–99)
Triglycerides: 64 mg/dL (ref 0–149)
VLDL Cholesterol Cal: 12 mg/dL (ref 5–40)

## 2024-06-13 LAB — VITAMIN B12: Vitamin B-12: 102 pg/mL — ABNORMAL LOW (ref 232–1245)

## 2024-06-13 LAB — HEMOGLOBIN A1C
Est. average glucose Bld gHb Est-mCnc: 114 mg/dL
Hgb A1c MFr Bld: 5.6 % (ref 4.8–5.6)

## 2024-06-14 ENCOUNTER — Other Ambulatory Visit: Payer: Self-pay

## 2024-06-14 ENCOUNTER — Emergency Department (HOSPITAL_COMMUNITY)

## 2024-06-14 ENCOUNTER — Inpatient Hospital Stay (HOSPITAL_COMMUNITY)
Admission: EM | Admit: 2024-06-14 | Discharge: 2024-06-17 | DRG: 392 | Disposition: A | Discharge status: Home / Self Care | Attending: Internal Medicine | Admitting: Internal Medicine

## 2024-06-14 DIAGNOSIS — E66813 Obesity, class 3: Secondary | ICD-10-CM | POA: Diagnosis present

## 2024-06-14 DIAGNOSIS — Z720 Tobacco use: Secondary | ICD-10-CM | POA: Diagnosis present

## 2024-06-14 DIAGNOSIS — E876 Hypokalemia: Secondary | ICD-10-CM | POA: Diagnosis present

## 2024-06-14 DIAGNOSIS — G932 Benign intracranial hypertension: Secondary | ICD-10-CM | POA: Diagnosis present

## 2024-06-14 DIAGNOSIS — J189 Pneumonia, unspecified organism: Principal | ICD-10-CM | POA: Insufficient documentation

## 2024-06-14 DIAGNOSIS — M329 Systemic lupus erythematosus, unspecified: Secondary | ICD-10-CM | POA: Diagnosis present

## 2024-06-14 DIAGNOSIS — A084 Viral intestinal infection, unspecified: Principal | ICD-10-CM | POA: Diagnosis present

## 2024-06-14 DIAGNOSIS — G629 Polyneuropathy, unspecified: Secondary | ICD-10-CM | POA: Diagnosis present

## 2024-06-14 DIAGNOSIS — Z803 Family history of malignant neoplasm of breast: Secondary | ICD-10-CM

## 2024-06-14 DIAGNOSIS — R0902 Hypoxemia: Secondary | ICD-10-CM | POA: Diagnosis not present

## 2024-06-14 DIAGNOSIS — R0602 Shortness of breath: Secondary | ICD-10-CM | POA: Diagnosis not present

## 2024-06-14 DIAGNOSIS — Z7985 Long-term (current) use of injectable non-insulin antidiabetic drugs: Secondary | ICD-10-CM

## 2024-06-14 DIAGNOSIS — F17210 Nicotine dependence, cigarettes, uncomplicated: Secondary | ICD-10-CM | POA: Diagnosis present

## 2024-06-14 DIAGNOSIS — Z8349 Family history of other endocrine, nutritional and metabolic diseases: Secondary | ICD-10-CM

## 2024-06-14 DIAGNOSIS — Z6841 Body Mass Index (BMI) 40.0 and over, adult: Secondary | ICD-10-CM

## 2024-06-14 DIAGNOSIS — R911 Solitary pulmonary nodule: Secondary | ICD-10-CM | POA: Diagnosis present

## 2024-06-14 DIAGNOSIS — I10 Essential (primary) hypertension: Secondary | ICD-10-CM | POA: Diagnosis present

## 2024-06-14 DIAGNOSIS — Z8249 Family history of ischemic heart disease and other diseases of the circulatory system: Secondary | ICD-10-CM

## 2024-06-14 DIAGNOSIS — R079 Chest pain, unspecified: Secondary | ICD-10-CM | POA: Diagnosis not present

## 2024-06-14 DIAGNOSIS — R112 Nausea with vomiting, unspecified: Secondary | ICD-10-CM | POA: Diagnosis present

## 2024-06-14 DIAGNOSIS — I7300 Raynaud's syndrome without gangrene: Secondary | ICD-10-CM | POA: Diagnosis present

## 2024-06-14 DIAGNOSIS — Z79899 Other long term (current) drug therapy: Secondary | ICD-10-CM

## 2024-06-14 DIAGNOSIS — M797 Fibromyalgia: Secondary | ICD-10-CM | POA: Diagnosis present

## 2024-06-14 DIAGNOSIS — Z1152 Encounter for screening for COVID-19: Secondary | ICD-10-CM

## 2024-06-14 DIAGNOSIS — R1110 Vomiting, unspecified: Secondary | ICD-10-CM | POA: Diagnosis not present

## 2024-06-14 DIAGNOSIS — Z8049 Family history of malignant neoplasm of other genital organs: Secondary | ICD-10-CM

## 2024-06-14 LAB — CBC
HCT: 38.3 % (ref 36.0–46.0)
Hemoglobin: 12.5 g/dL (ref 12.0–15.0)
MCH: 28.9 pg (ref 26.0–34.0)
MCHC: 32.6 g/dL (ref 30.0–36.0)
MCV: 88.5 fL (ref 80.0–100.0)
Platelets: 348 K/uL (ref 150–400)
RBC: 4.33 MIL/uL (ref 3.87–5.11)
RDW: 19.2 % — ABNORMAL HIGH (ref 11.5–15.5)
WBC: 9.2 K/uL (ref 4.0–10.5)
nRBC: 0 % (ref 0.0–0.2)

## 2024-06-14 LAB — HEPATIC FUNCTION PANEL
ALT: 10 U/L (ref 0–44)
AST: 15 U/L (ref 15–41)
Albumin: 4.6 g/dL (ref 3.5–5.0)
Alkaline Phosphatase: 87 U/L (ref 38–126)
Bilirubin, Direct: 0.2 mg/dL (ref 0.0–0.2)
Indirect Bilirubin: 0.3 mg/dL (ref 0.3–0.9)
Total Bilirubin: 0.5 mg/dL (ref 0.0–1.2)
Total Protein: 8.2 g/dL — ABNORMAL HIGH (ref 6.5–8.1)

## 2024-06-14 LAB — LIPASE, BLOOD: Lipase: 29 U/L (ref 11–51)

## 2024-06-14 LAB — BASIC METABOLIC PANEL WITH GFR
Anion gap: 14 (ref 5–15)
BUN: 8 mg/dL (ref 6–20)
CO2: 17 mmol/L — ABNORMAL LOW (ref 22–32)
Calcium: 9.5 mg/dL (ref 8.9–10.3)
Chloride: 109 mmol/L (ref 98–111)
Creatinine, Ser: 0.78 mg/dL (ref 0.44–1.00)
GFR, Estimated: >60 mL/min (ref >60)
Glucose, Bld: 113 mg/dL — ABNORMAL HIGH (ref 70–99)
Potassium: 3.2 mmol/L — ABNORMAL LOW (ref 3.5–5.1)
Sodium: 140 mmol/L (ref 135–145)

## 2024-06-14 LAB — TROPONIN T, HIGH SENSITIVITY: Troponin T High Sensitivity: <15 ng/L (ref 0–19)

## 2024-06-14 LAB — HCG, SERUM, QUALITATIVE: Preg, Serum: NEGATIVE

## 2024-06-14 LAB — RESP PANEL BY RT-PCR (RSV, FLU A&B, COVID)  RVPGX2
Influenza A by PCR: NEGATIVE
Influenza B by PCR: NEGATIVE
Resp Syncytial Virus by PCR: NEGATIVE
SARS Coronavirus 2 by RT PCR: NEGATIVE

## 2024-06-14 MED ORDER — HYDROMORPHONE HCL 1 MG/ML IJ SOLN
1.0000 mg | Freq: Once | INTRAMUSCULAR | Status: AC
  Administered 2024-06-15: 1 mg via INTRAVENOUS
  Filled 2024-06-14: qty 1

## 2024-06-14 MED ORDER — SODIUM CHLORIDE 0.9 % IV BOLUS
1000.0000 mL | Freq: Once | INTRAVENOUS | Status: AC
Start: 2024-06-14 — End: 2024-06-15
  Administered 2024-06-14: 1000 mL via INTRAVENOUS

## 2024-06-14 MED ORDER — FENTANYL CITRATE PF 50 MCG/ML IJ SOSY
50.0000 ug | PREFILLED_SYRINGE | Freq: Once | INTRAMUSCULAR | Status: AC
  Administered 2024-06-14: 50 ug via INTRAVENOUS
  Filled 2024-06-14: qty 1

## 2024-06-14 MED ORDER — IOHEXOL 350 MG/ML SOLN
100.0000 mL | Freq: Once | INTRAVENOUS | Status: AC | PRN
  Administered 2024-06-15: 100 mL via INTRAVENOUS

## 2024-06-14 MED ORDER — ONDANSETRON HCL 4 MG/2ML IJ SOLN
4.0000 mg | Freq: Once | INTRAMUSCULAR | Status: AC
  Administered 2024-06-14: 4 mg via INTRAVENOUS
  Filled 2024-06-14: qty 2

## 2024-06-14 NOTE — ED Provider Notes (Signed)
  EMERGENCY DEPARTMENT AT [facility] Provider Note   CSN: [id number] Arrival date & time: 06/14/24  2044     Patient presents with: Shortness of Breath, Vomiting, and Chest Pain   Diana Huynh is a 43 y.o. female.    Shortness of Breath Associated symptoms: chest pain   Chest Pain Associated symptoms: shortness of breath   Patient presents with vomiting.  Shortness of breath.  Chest pain.  Began yesterday around 230.  Body aches.  Is a history of lupus and fibromyalgia and chronic neck pain.  States vomiting and medicines may not be staying down.  Also has headache.  Has had that for a few days.  History of intracranial hypertension.  Also states history of pericarditis and the chest pain feels somewhat the same.    Past Medical History:  Diagnosis Date   Bronchitis    Fibromyalgia    Lupus    Pericarditis    per pt report   Pleuritis 12/31/2017    Prior to Admission medications   Medication Sig Start Date End Date Taking? Authorizing Provider  acetaZOLAMIDE  ER (DIAMOX ) 500 MG capsule Take 1 capsule (500 mg total) by mouth 2 (two) times daily. 12/18/23   Sater, Charlie LABOR, MD  albuterol  (VENTOLIN  HFA) 108 (90 Base) MCG/ACT inhaler Inhale 1-2 puffs into the lungs every 6 (six) hours as needed for wheezing or shortness of breath. 05/07/20   Babara, Amy V, PA-C  amLODipine (NORVASC) 5 MG tablet Take 5 mg by mouth daily. 08/02/22   [provider]  butalbital -acetaminophen -caffeine  (FIORICET) 50-325-40 MG tablet TAKE ONE TO TWO TABLETS BY MOUTH EVERY 6 HOURS AS NEEDED FOR HEADACHE 04/23/24   Rosemarie Eather RAMAN, MD  butalbital -acetaminophen -caffeine  (FIORICET) 50-325-40 MG tablet Take 1-2 tablets by mouth every 6 (six) hours as needed for headache. 04/21/24 04/21/25  Sater, Charlie LABOR, MD  DULoxetine  (CYMBALTA ) 60 MG capsule Take 60 mg by mouth every evening.    [provider]  famotidine  (PEPCID ) 20 MG tablet Take 20 mg by mouth daily.      [provider]  hydroxychloroquine  (PLAQUENIL ) 200 MG tablet Take 400 mg by mouth daily.    [provider]  pregabalin (LYRICA) 150 MG capsule Take 150 mg by mouth 2 (two) times daily.    [provider]  sulindac  (CLINORIL ) 200 MG tablet Take 1 tablet (200 mg total) by mouth 2 (two) times daily. 03/16/24   Sater, Charlie LABOR, MD  tirzepatide  (ZEPBOUND ) 2.5 MG/0.5ML injection vial Inject 2.5 mg into the skin once a week. 06/12/24   Elnora Ip, MD  tiZANidine  (ZANAFLEX ) 4 MG tablet Take 1 tablet (4 mg total) by mouth 3 (three) times daily. 03/10/24 09/06/24  Sater, Charlie LABOR, MD  triamcinolone  (KENALOG ) 0.025 % ointment Apply 1 Application topically 2 (two) times daily. 02/19/23   Lemon Raisin, MD    Allergies: Patient has no known allergies.    Review of Systems  Respiratory:  Positive for shortness of breath.   Cardiovascular:  Positive for chest pain.    Updated Vital Signs BP (!) 159/72 (BP Location: Right Arm)   Pulse (!) 53   Temp 98.9 F (37.2 C) (Oral)   Resp 20   SpO2 100%   Physical Exam Vitals and nursing note reviewed.  HENT:     Head: Normocephalic.  Cardiovascular:     Rate and Rhythm: Regular rhythm.  Pulmonary:     Breath sounds: No wheezing or rhonchi.  Chest:  Chest wall: Tenderness present.     Comments: Tenderness to anterior chest wall. Abdominal:     Tenderness: There is abdominal tenderness.     Comments: Mild upper abdominal tenderness without rebound or guarding.  No hernia palpated.  Skin:    General: Skin is warm.  Neurological:     Mental Status: She is alert and oriented to person, place, and time.     (all labs ordered are listed, but only abnormal results are displayed) Labs Reviewed  CBC - Abnormal; Notable for the following components:      Result Value   RDW 19.2 (*)    All other components within normal limits  HCG, SERUM, QUALITATIVE  BASIC METABOLIC PANEL WITH GFR  HEPATIC FUNCTION PANEL   LIPASE, BLOOD  TROPONIN T, HIGH SENSITIVITY    EKG: EKG Interpretation Date/Time:  Sunday June 14 2024 20:59:10 EDT Ventricular Rate:  106 PR Interval:  162 QRS Duration:  94 QT Interval:  361 QTC Calculation: 401 R Axis:   83  Text Interpretation: Sinus tachycardia Ventricular bigeminy Consider right atrial enlargement Borderline T abnormalities, diffuse leads Confirmed by Patsey Lot [id number]) on 06/14/2024 9:56:39 PM  Radiology: [facility] Chest 2 View Result Date: 06/14/2024 CLINICAL DATA:  Shortness of breath, chest pain, vomiting EXAM: CHEST - 2 VIEW COMPARISON:  Chest x-ray 12/19/2023, CT chest 12/19/2023 FINDINGS: The heart and mediastinal contours are within normal limits. No focal consolidation. No pulmonary edema. No pleural effusion. No pneumothorax. No acute osseous abnormality. IMPRESSION: No active cardiopulmonary disease. Electronically Signed   By: Morgane  Naveau M.D.   On: 06/14/2024 22:01     Procedures   Medications Ordered in the ED  ondansetron  (ZOFRAN ) injection 4 mg (has no administration in time range)  fentaNYL  (SUBLIMAZE ) injection 50 mcg (has no administration in time range)  sodium chloride  0.9 % bolus 1,000 mL (has no administration in time range)                                    Medical Decision Making Amount and/or Complexity of Data Reviewed Labs: ordered. Radiology: ordered.  Risk Prescription drug management.   Patient presents with multiple complaints.  Headache chest pain nausea and vomiting myalgias.  History of lupus.  History of pericarditis.  EKG does show ventricular bigeminy.  Will get blood work.  Will give antiemetics.  Will give fluids.  Will get chest x-ray.  Reviewed recent PCP note.  Care will be turned over to oncoming provider.     Final diagnoses:  None    ED Discharge Orders     None          Patsey Lot, MD 06/14/24 2249

## 2024-06-14 NOTE — ED Triage Notes (Addendum)
 Patient c/o SOB with vomiting (yesterday about 1430) body aches, pains, constant headache, chest pain throughout the day. Patient also complains of not able to take her medications for her lupus or fibromyalgia or her cervical stenosis and now she is having stiffness body ache and chills. Patient c/o chest pain and left arm pain and numbness off and on lately.

## 2024-06-15 ENCOUNTER — Ambulatory Visit: Payer: Self-pay | Admitting: Student

## 2024-06-15 ENCOUNTER — Observation Stay (HOSPITAL_BASED_OUTPATIENT_CLINIC_OR_DEPARTMENT_OTHER)

## 2024-06-15 ENCOUNTER — Encounter (HOSPITAL_COMMUNITY): Payer: Self-pay | Admitting: Internal Medicine

## 2024-06-15 DIAGNOSIS — R112 Nausea with vomiting, unspecified: Secondary | ICD-10-CM | POA: Diagnosis not present

## 2024-06-15 DIAGNOSIS — R519 Headache, unspecified: Secondary | ICD-10-CM | POA: Diagnosis not present

## 2024-06-15 DIAGNOSIS — R109 Unspecified abdominal pain: Secondary | ICD-10-CM | POA: Diagnosis not present

## 2024-06-15 DIAGNOSIS — M3219 Other organ or system involvement in systemic lupus erythematosus: Secondary | ICD-10-CM

## 2024-06-15 DIAGNOSIS — G932 Benign intracranial hypertension: Secondary | ICD-10-CM | POA: Diagnosis not present

## 2024-06-15 DIAGNOSIS — R918 Other nonspecific abnormal finding of lung field: Secondary | ICD-10-CM | POA: Diagnosis not present

## 2024-06-15 DIAGNOSIS — R079 Chest pain, unspecified: Secondary | ICD-10-CM | POA: Diagnosis not present

## 2024-06-15 DIAGNOSIS — J189 Pneumonia, unspecified organism: Secondary | ICD-10-CM | POA: Diagnosis not present

## 2024-06-15 DIAGNOSIS — N83291 Other ovarian cyst, right side: Secondary | ICD-10-CM | POA: Diagnosis not present

## 2024-06-15 DIAGNOSIS — R29818 Other symptoms and signs involving the nervous system: Secondary | ICD-10-CM | POA: Diagnosis not present

## 2024-06-15 DIAGNOSIS — I7300 Raynaud's syndrome without gangrene: Secondary | ICD-10-CM

## 2024-06-15 LAB — BLOOD CULTURE ID PANEL (REFLEXED) - BCID2

## 2024-06-15 LAB — BASIC METABOLIC PANEL WITH GFR
Anion gap: 11 (ref 5–15)
BUN: 6 mg/dL (ref 6–20)
CO2: 19 mmol/L — ABNORMAL LOW (ref 22–32)
Calcium: 8.8 mg/dL — ABNORMAL LOW (ref 8.9–10.3)
Chloride: 113 mmol/L — ABNORMAL HIGH (ref 98–111)
Creatinine, Ser: 0.75 mg/dL (ref 0.44–1.00)
GFR, Estimated: >60 mL/min (ref >60)
Glucose, Bld: 139 mg/dL — ABNORMAL HIGH (ref 70–99)
Potassium: 3.5 mmol/L (ref 3.5–5.1)
Sodium: 143 mmol/L (ref 135–145)

## 2024-06-15 LAB — CBC WITH DIFFERENTIAL/PLATELET
Abs Immature Granulocytes: 0.02 K/uL (ref 0.00–0.07)
Basophils Absolute: 0.1 K/uL (ref 0.0–0.1)
Basophils Relative: 1 %
Eosinophils Absolute: 0.1 K/uL (ref 0.0–0.5)
Eosinophils Relative: 1 %
HCT: 34.6 % — ABNORMAL LOW (ref 36.0–46.0)
Hemoglobin: 10.9 g/dL — ABNORMAL LOW (ref 12.0–15.0)
Immature Granulocytes: 0 %
Lymphocytes Relative: 25 %
Lymphs Abs: 2 K/uL (ref 0.7–4.0)
MCH: 28.2 pg (ref 26.0–34.0)
MCHC: 31.5 g/dL (ref 30.0–36.0)
MCV: 89.6 fL (ref 80.0–100.0)
Monocytes Absolute: 0.8 K/uL (ref 0.1–1.0)
Monocytes Relative: 10 %
Neutro Abs: 5 K/uL (ref 1.7–7.7)
Neutrophils Relative %: 63 %
Platelets: 289 K/uL (ref 150–400)
RBC: 3.86 MIL/uL — ABNORMAL LOW (ref 3.87–5.11)
RDW: 19 % — ABNORMAL HIGH (ref 11.5–15.5)
WBC: 8 K/uL (ref 4.0–10.5)
nRBC: 0 % (ref 0.0–0.2)

## 2024-06-15 LAB — TROPONIN T, HIGH SENSITIVITY
Troponin T High Sensitivity: <15 ng/L (ref 0–19)
Troponin T High Sensitivity: <15 ng/L (ref 0–19)

## 2024-06-15 LAB — HEPATIC FUNCTION PANEL
ALT: 7 U/L (ref 0–44)
AST: 15 U/L (ref 15–41)
Albumin: 4.1 g/dL (ref 3.5–5.0)
Alkaline Phosphatase: 73 U/L (ref 38–126)
Bilirubin, Direct: 0.2 mg/dL (ref 0.0–0.2)
Indirect Bilirubin: 0.2 mg/dL — ABNORMAL LOW (ref 0.3–0.9)
Total Bilirubin: 0.4 mg/dL (ref 0.0–1.2)
Total Protein: 6.9 g/dL (ref 6.5–8.1)

## 2024-06-15 LAB — HIV ANTIBODY (ROUTINE TESTING W REFLEX): HIV Screen 4th Generation wRfx: NONREACTIVE

## 2024-06-15 LAB — C-REACTIVE PROTEIN: CRP: <0.5 mg/dL (ref <1.0)

## 2024-06-15 LAB — ECHOCARDIOGRAM COMPLETE
Area-P 1/2: 3.33 cm2
Height: 61 in
S' Lateral: 3.4 cm
Weight: 3406.4 [oz_av]

## 2024-06-15 LAB — SEDIMENTATION RATE: Sed Rate: 5 mm/h (ref 0–22)

## 2024-06-15 LAB — LACTIC ACID, PLASMA
Lactic Acid, Venous: 0.7 mmol/L (ref 0.5–1.9)
Lactic Acid, Venous: 0.8 mmol/L (ref 0.5–1.9)

## 2024-06-15 MED ORDER — DOCUSATE SODIUM 100 MG PO CAPS
100.0000 mg | ORAL_CAPSULE | Freq: Once | ORAL | Status: AC
  Administered 2024-06-15: 100 mg via ORAL
  Filled 2024-06-15: qty 1

## 2024-06-15 MED ORDER — IBUPROFEN 200 MG PO TABS
400.0000 mg | ORAL_TABLET | Freq: Three times a day (TID) | ORAL | Status: DC
  Administered 2024-06-15 – 2024-06-16 (×4): 400 mg via ORAL
  Filled 2024-06-15 (×4): qty 2

## 2024-06-15 MED ORDER — CEFTRIAXONE SODIUM 1 G IJ SOLR
1.0000 g | Freq: Once | INTRAMUSCULAR | Status: AC
  Administered 2024-06-15: 1 g via INTRAVENOUS
  Filled 2024-06-15: qty 10

## 2024-06-15 MED ORDER — ACETAMINOPHEN 650 MG RE SUPP: 650.0000 mg | Freq: Four times a day (QID) | RECTAL | Status: DC | PRN

## 2024-06-15 MED ORDER — DULOXETINE HCL 60 MG PO CPEP
60.0000 mg | ORAL_CAPSULE | Freq: Every evening | ORAL | Status: DC
  Administered 2024-06-15 – 2024-06-16 (×2): 60 mg via ORAL
  Filled 2024-06-15 (×2): qty 1

## 2024-06-15 MED ORDER — AMLODIPINE BESYLATE 5 MG PO TABS
5.0000 mg | ORAL_TABLET | Freq: Every day | ORAL | Status: DC
  Administered 2024-06-15 – 2024-06-16 (×2): 5 mg via ORAL
  Filled 2024-06-15 (×2): qty 1

## 2024-06-15 MED ORDER — SODIUM CHLORIDE 0.9 % IV SOLN
1.0000 g | INTRAVENOUS | Status: DC
  Administered 2024-06-16 – 2024-06-17 (×2): 1 g via INTRAVENOUS
  Filled 2024-06-15 (×2): qty 10

## 2024-06-15 MED ORDER — AZITHROMYCIN 500 MG IV SOLR
500.0000 mg | Freq: Once | INTRAVENOUS | Status: AC
  Administered 2024-06-15: 500 mg via INTRAVENOUS
  Filled 2024-06-15: qty 5

## 2024-06-15 MED ORDER — HYDROXYCHLOROQUINE SULFATE 200 MG PO TABS
400.0000 mg | ORAL_TABLET | Freq: Every day | ORAL | Status: DC
  Administered 2024-06-15 – 2024-06-17 (×3): 400 mg via ORAL
  Filled 2024-06-15 (×3): qty 2

## 2024-06-15 MED ORDER — ONDANSETRON HCL 4 MG/2ML IJ SOLN
4.0000 mg | Freq: Four times a day (QID) | INTRAMUSCULAR | Status: DC | PRN
  Administered 2024-06-15 (×2): 4 mg via INTRAVENOUS
  Filled 2024-06-15 (×2): qty 2

## 2024-06-15 MED ORDER — ACETAMINOPHEN 325 MG PO TABS
650.0000 mg | ORAL_TABLET | Freq: Four times a day (QID) | ORAL | Status: DC | PRN
  Administered 2024-06-15 – 2024-06-17 (×6): 650 mg via ORAL
  Filled 2024-06-15 (×6): qty 2

## 2024-06-15 MED ORDER — SODIUM CHLORIDE 0.9 % IV SOLN
500.0000 mg | INTRAVENOUS | Status: DC
  Administered 2024-06-16 – 2024-06-17 (×2): 500 mg via INTRAVENOUS
  Filled 2024-06-15 (×2): qty 5

## 2024-06-15 MED ORDER — ACETAZOLAMIDE ER 500 MG PO CP12
500.0000 mg | ORAL_CAPSULE | Freq: Two times a day (BID) | ORAL | Status: DC
  Administered 2024-06-15 – 2024-06-17 (×4): 500 mg via ORAL
  Filled 2024-06-15 (×5): qty 1

## 2024-06-15 MED ORDER — PREGABALIN 75 MG PO CAPS
150.0000 mg | ORAL_CAPSULE | Freq: Every day | ORAL | Status: DC
  Administered 2024-06-15 – 2024-06-17 (×3): 150 mg via ORAL
  Filled 2024-06-15 (×3): qty 2

## 2024-06-15 MED ORDER — POTASSIUM CHLORIDE 20 MEQ PO PACK
20.0000 meq | PACK | Freq: Once | ORAL | Status: AC
  Administered 2024-06-15: 20 meq via ORAL
  Filled 2024-06-15: qty 1

## 2024-06-15 MED ORDER — AZATHIOPRINE 50 MG PO TABS
50.0000 mg | ORAL_TABLET | Freq: Every day | ORAL | Status: DC
  Administered 2024-06-15 – 2024-06-16 (×2): 50 mg via ORAL
  Filled 2024-06-15 (×3): qty 1

## 2024-06-15 MED ORDER — ONDANSETRON HCL 4 MG/2ML IJ SOLN
4.0000 mg | Freq: Once | INTRAMUSCULAR | Status: AC
Start: 2024-06-15 — End: 2024-06-15
  Administered 2024-06-15: 4 mg via INTRAVENOUS
  Filled 2024-06-15: qty 2

## 2024-06-15 MED ORDER — TIZANIDINE HCL 4 MG PO TABS
4.0000 mg | ORAL_TABLET | Freq: Three times a day (TID) | ORAL | Status: DC | PRN
  Administered 2024-06-15 – 2024-06-17 (×6): 4 mg via ORAL
  Filled 2024-06-15 (×7): qty 1

## 2024-06-15 MED ORDER — LACTATED RINGERS IV SOLN: INTRAVENOUS | Status: DC

## 2024-06-15 NOTE — Progress Notes (Signed)
 PHARMACY - PHYSICIAN COMMUNICATION CRITICAL VALUE ALERT - BLOOD CULTURE IDENTIFICATION (BCID)  Diana Huynh is an 43 y.o. female who presented to [facility] on 06/14/2024 with a chief complaint of N/V/HA.  Assessment:  2/4 GP cocci, staph species  Name of physician (or Provider) Contacted: JINNY Kipper  Current antibiotics: Azith/CTX  Changes to prescribed antibiotics recommended:  none  Results for orders placed or performed during the hospital encounter of 06/14/24  Blood Culture ID Panel (Reflexed) (Collected: 06/15/2024  2:44 AM)  Result Value Ref Range   Enterococcus faecalis NOT DETECTED NOT DETECTED   Enterococcus Faecium NOT DETECTED NOT DETECTED   Listeria monocytogenes NOT DETECTED NOT DETECTED   Staphylococcus species DETECTED (A) NOT DETECTED   Staphylococcus aureus (BCID) NOT DETECTED NOT DETECTED   Staphylococcus epidermidis NOT DETECTED NOT DETECTED   Staphylococcus lugdunensis NOT DETECTED NOT DETECTED   Streptococcus species NOT DETECTED NOT DETECTED   Streptococcus agalactiae NOT DETECTED NOT DETECTED   Streptococcus pneumoniae NOT DETECTED NOT DETECTED   Streptococcus pyogenes NOT DETECTED NOT DETECTED   A.calcoaceticus-baumannii NOT DETECTED NOT DETECTED   Bacteroides fragilis NOT DETECTED NOT DETECTED   Enterobacterales NOT DETECTED NOT DETECTED   Enterobacter cloacae complex NOT DETECTED NOT DETECTED   Escherichia coli NOT DETECTED NOT DETECTED   Klebsiella aerogenes NOT DETECTED NOT DETECTED   Klebsiella oxytoca NOT DETECTED NOT DETECTED   Klebsiella pneumoniae NOT DETECTED NOT DETECTED   Proteus species NOT DETECTED NOT DETECTED   Salmonella species NOT DETECTED NOT DETECTED   Serratia marcescens NOT DETECTED NOT DETECTED   Haemophilus influenzae NOT DETECTED NOT DETECTED   Neisseria meningitidis NOT DETECTED NOT DETECTED   Pseudomonas aeruginosa NOT DETECTED NOT DETECTED   Stenotrophomonas maltophilia NOT DETECTED NOT DETECTED   Candida albicans NOT  DETECTED NOT DETECTED   Candida auris NOT DETECTED NOT DETECTED   Candida glabrata NOT DETECTED NOT DETECTED   Candida krusei NOT DETECTED NOT DETECTED   Candida parapsilosis NOT DETECTED NOT DETECTED   Candida tropicalis NOT DETECTED NOT DETECTED   Cryptococcus neoformans/gattii NOT DETECTED NOT DETECTED    Leeroy Mace RPh 06/15/2024, 10:50 PM

## 2024-06-15 NOTE — Progress Notes (Signed)
  Progress Note   Patient: Diana Huynh FMW:[id number] DOB: January 04, 1981 DOA: 06/14/2024     0 DOS: the patient was seen and examined on 06/15/2024   Brief hospital course: 43 year old woman PMH including SLE, idiopathic intracranial hypertension, presented with nausea, vomiting, headache.  Consultants None   Procedures/Events None   Assessment and Plan: Intractable nausea and vomiting Resolved.  Consideration given to viral gastroenteritis.  LFTs were unremarkable. Tolerating diet.  Follow clinically.  Possible pneumonia Inflammatory or infectious nodule on chest CT Empiric treatment  Idiopathic intracranial hypertension  Continue acetazolamide  Recently seen by ophthalmologist, no papilledema Headache improved, close to baseline  SLE on Plaquenil  and Imuran  was recently started.   Patient also takes Saphnelo .  No pericardial effusion on echo  Raynaud's disease  Hypertension Amlodipine   Neuropathy and fibromyalgia on Cymbalta  and Lyrica .  Obesity - patient's primary care physician was planning to start Mounjaro  but patient has still not started taking it. Body mass index is 40.23 kg/m.  If continues to improve possible discharge tomorrow.    Subjective:  Feels better Still has some headache, but chronic, though worse with movement N/v resolved Tolerating diet  Physical Exam: Vitals:   06/15/24 0050 06/15/24 0344 06/15/24 0741 06/15/24 1202  BP: 130/67 (!) 143/79 (!) 147/79 114/70  Pulse: 86 73 72 73  Resp: 18 (!) 22 18 17   Temp: 98 F (36.7 C) 97.8 F (36.6 C) 99.3 F (37.4 C) 98.2 F (36.8 C)  TempSrc: Oral Oral Oral Oral  SpO2: 100% 100% 100% 99%  Weight:  96.6 kg    Height:  5' 1 (1.549 m)     Physical Exam Vitals reviewed.  Constitutional:      General: She is not in acute distress.    Appearance: She is not ill-appearing or toxic-appearing.  Cardiovascular:     Rate and Rhythm: Normal rate and regular rhythm.     Heart sounds: No murmur  heard. Pulmonary:     Effort: Pulmonary effort is normal. No respiratory distress.     Breath sounds: No wheezing, rhonchi or rales.  Neurological:     Mental Status: She is alert.  Psychiatric:        Mood and Affect: Mood normal.        Behavior: Behavior normal.     Data Reviewed: CMP noted Troponins negative CRP normal Lactic acid within normal limits CBC within normal limits 2D echocardiogram reassuring CT abdomen pelvis nodular infiltrate right lower lobe with inflammation, could be inflammatory, asymptomatic CT head no acute abnormality  Family Communication: none  Disposition: Status is: Observation      Time spent: 45 minutes  Author: Toribio Door, MD 06/15/2024 7:42 PM  For on call review [email or web address].

## 2024-06-15 NOTE — Progress Notes (Signed)
 Patient currently hospitalized for intractable nausea and vomiting thought to be secondary to gastroenteritis. has not received Zepbound .  Discussed how if she received this medication to wait until this viral illness is resolved.  Low ASCVD risk; will monitor with life style modifications. Low B12; will likey receive treatment while inpatient. Will need to make sure has medication at follow up after hospitalization.

## 2024-06-15 NOTE — Progress Notes (Signed)
  Echocardiogram 2D Echocardiogram has been performed.  Tinnie FORBES Gosling RDCS 06/15/2024, 9:25 AM

## 2024-06-15 NOTE — ED Provider Notes (Signed)
 Care assumed from Dr. Patsey.  Patient with a history of lupus, fibromyalgia, chronic pain here with nausea, vomiting, shortness of breath, headache and cough.  Pending imaging.  CTA shows no PE but does show infiltrate right lower lobe concerning for pneumonia.  Right ovarian cyst.  CT head negative.  No respiratory distress.  No hypoxia or increased work of breathing.  Given ongoing pain with nausea and vomiting applied admission for IV antibiotics.  Admission discussed with Dr. Franky.   Carita Senior, MD 06/15/24 [phone]

## 2024-06-15 NOTE — TOC Initial Note (Signed)
 Transition of Care [facility]) - Initial/Assessment Note    Patient Details  Name: Diana Huynh MRN: [id number] Date of Birth: September 03, 1981  Transition of Care [facility] [place]) CM/SW Contact:    Bascom Service, RN Phone Number: 06/15/2024, 3:36 PM  Clinical Narrative: d/c plan home.                  Expected Discharge Plan: Home/Self Care Barriers to Discharge: Continued Medical Work up   Patient Goals and CMS Choice Patient states their goals for this hospitalization and ongoing recovery are:: Home CMS Medicare.gov Compare Post Acute Care list provided to:: Patient Choice offered to / list presented to : Patient [place] ownership interest in [facility].provided to:: Patient    Expected Discharge Plan and Services                                              Prior Living Arrangements/Services                       Activities of Daily Living   ADL Screening (condition at time of admission) Independently performs ADLs?: Yes (appropriate for developmental age) Is the patient deaf or have difficulty hearing?: No Does the patient have difficulty seeing, even when wearing glasses/contacts?: No Does the patient have difficulty concentrating, remembering, or making decisions?: No  Permission Sought/Granted                  Emotional Assessment              Admission diagnosis:  Nausea & vomiting [R11.2] Community acquired pneumonia of right lower lobe of lung [J18.9] Patient Active Problem List   Diagnosis Date Noted   Nausea & vomiting 06/15/2024   Pneumonia 06/15/2024   Neuropathy 06/12/2024   OSA (obstructive sleep apnea) 04/10/2024   Dysmenorrhea 03/02/2024   Ovarian cyst 12/23/2023   Raynaud disease 12/04/2023   Idiopathic intracranial hypertension 06/24/2023   Cervical stenosis of spinal canal 05/16/2023   Papular rash 02/20/2023   Folliculitis of axilla 02/20/2023   Mammary duct ectasia of breast, 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   Obesity, morbid,  BMI 40.0-49.9 (HCC) 02/19/2023   Encounter for counseling regarding contraception 09/07/2022   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 [place] [address] Phone: [phone] Fax: [phone]     Social Drivers of Health (SDOH) Social History: SDOH Screenings   Food Insecurity: Food Insecurity Present (06/15/2024)  Housing: Low Risk  (06/15/2024)  Transportation Needs: No Transportation Needs (06/15/2024)  Utilities: Not At Risk (06/15/2024)  Depression (PHQ2-9): Medium Risk (03/02/2024)  Financial Resource Strain: Medium Risk (04/10/2024)  Physical Activity: Inactive (04/10/2024)  Social Connections: Socially Isolated (04/10/2024)  Stress: Stress Concern Present (04/10/2024)  Tobacco Use: High Risk (06/15/2024)   SDOH Interventions:     Readmission Risk Interventions     No data to display

## 2024-06-15 NOTE — Telephone Encounter (Signed)
 Dear Dr. Hadassah Ala: [facility] reviewed your ZEPBOUND  2.5 MG/0.5 ML PEN request for the above-identified  member, and it is denied for the following reason: because we did not see what we need to  approve the drug you asked for, (Zepbound  2.5 milligram per 0.5 milliliter pen). We may be  able to approve this drug in certain situations (when you have had a trial and failure of  Wegovy, including completion of 3 to 6 months of Wegovy to complete dose titration and  determine your side effect profile; or when you have a documented contraindication to  [facility]). We do not see that this applies to you. If this applies to you, we may need more  information (records that you are currently on and will continue lifestyle modifications [including  structured nutrition and physical activity, unless physical activity is not appropriate when  starting the requested drug]; if you will use the requested drug with another similar drug; if  there are reasons you should not use the requested drug [Food and Drug Administrationlabeled contraindications]). We based this decision on your health plan's prior authorization  clinical criteria named GLP1s (glucagon-like peptide 1s) for Weight Management.

## 2024-06-15 NOTE — H&P (Addendum)
 History and Physical    Diana Huynh FMW:[id number] DOB: 09/12/1981 DOA: 06/14/2024  Patient coming from: Home.  Chief Complaint: Nausea vomiting.  HPI: Diana Huynh is a 43 y.o. female with history of SLE, idiopathic intracranial hypertension, neuropathy, fibromyalgia prior history of pericarditis and pericardial effusion presents to the ER with intractable nausea vomiting ongoing for last 24 hours.  Denies any diarrhea or any fever or chills.  No recent travel or sick contacts.  Patient had recently followed up with ophthalmologist about 4 days ago on Thursday, June 11, 2024 when patient's ophthalmologist as per the patient stated that she did not have any papilledema.  Patient states she has been compliant with her medications and her rheumatologist had recently placed her on Imuran  and has been taking it for last 1 month along with her Plaquenil  and Saphnelo  infusion.  Patient has some nonproductive cough for last few days.  When she is throwing up she does get some chest pain.  ED Course: In the ER patient had CT head which did not show anything acute.  CT abdomen pelvis also did not show anything acute.  CT angiogram of the chest was negative for PE but did show signs of bronchopneumonia.  Was started on antibiotics admitted for further observation.  Labs show mild hypokalemia.  WBCs 9.2.  EKG shows sinus rhythm with bigeminy.  Review of Systems: As per HPI, rest all negative.   Past Medical History:  Diagnosis Date   Bronchitis    Fibromyalgia    Lupus    Pericarditis    per pt report   Pleuritis 12/31/2017    Past Surgical History:  Procedure Laterality Date   ANKLE SURGERY     APPENDECTOMY     CESAREAN SECTION       reports that she has been smoking cigarettes. She has never used smokeless tobacco. She reports that she does not currently use alcohol. She reports that she does not use drugs.  No Known Allergies  Family History  Problem Relation Age of Onset    Hypertension Mother    Heart murmur Mother    Healthy Father    Thyroid  disease Sister    Heart failure Sister        has ICD   Cervical cancer Sister    Breast cancer Paternal Aunt    Breast cancer Paternal Grandmother    Neuropathy Neg Hx     Prior to Admission medications   Medication Sig Start Date End Date Taking? Authorizing Provider  acetaZOLAMIDE  ER (DIAMOX ) 500 MG capsule Take 1 capsule (500 mg total) by mouth 2 (two) times daily. 12/18/23  Yes Sater, Charlie LABOR, MD  albuterol  (VENTOLIN  HFA) 108 (90 Base) MCG/ACT inhaler Inhale 1-2 puffs into the lungs every 6 (six) hours as needed for wheezing or shortness of breath. 05/07/20  Yes Yu, Amy V, PA-C  amLODipine  (NORVASC ) 5 MG tablet Take 5 mg by mouth daily. 08/02/22  Yes [provider]  azaTHIOprine  (IMURAN ) 50 MG tablet Take 50 mg by mouth daily. 06/02/24  Yes [provider]  butalbital -acetaminophen -caffeine  (FIORICET ) 50-325-40 MG tablet TAKE ONE TO TWO TABLETS BY MOUTH EVERY 6 HOURS AS NEEDED FOR HEADACHE 04/23/24  Yes Rosemarie Eather RAMAN, MD  DULoxetine  (CYMBALTA ) 60 MG capsule Take 60 mg by mouth every evening.   Yes [provider]  famotidine  (PEPCID ) 20 MG tablet Take 20 mg by mouth daily.    Yes [provider]  hydroxychloroquine  (PLAQUENIL ) 200 MG tablet Take 400 mg  by mouth daily.   Yes [provider]  pregabalin  (LYRICA ) 150 MG capsule Take 150 mg by mouth in the morning, at noon, and at bedtime.   Yes [provider]  sulindac  (CLINORIL ) 200 MG tablet Take 1 tablet (200 mg total) by mouth 2 (two) times daily. 03/16/24  Yes Sater, Charlie LABOR, MD  tiZANidine  (ZANAFLEX ) 4 MG tablet Take 1 tablet (4 mg total) by mouth 3 (three) times daily. 03/10/24 09/06/24 Yes Sater, Charlie LABOR, MD  butalbital -acetaminophen -caffeine  (FIORICET ) 50-325-40 MG tablet Take 1-2 tablets by mouth every 6 (six) hours as needed for headache. Patient not taking: Reported on 06/14/2024 04/21/24 04/21/25  Vear Charlie LABOR, MD  tirzepatide  (ZEPBOUND ) 2.5 MG/0.5ML injection vial Inject 2.5 mg into the skin once a week. 06/12/24   Elnora Ip, MD  triamcinolone  (KENALOG ) 0.025 % ointment Apply 1 Application topically 2 (two) times daily. Patient not taking: Reported on 06/14/2024 02/19/23   Lemon Raisin, MD    Physical Exam: Constitutional: Moderately built and nourished. Vitals:   06/14/24 2054 06/14/24 2116 06/15/24 0050 06/15/24 0344  BP: (!) 159/72  130/67 (!) 143/79  Pulse: (!) 53  86 73  Resp: 20  18 (!) 22  Temp: 98.9 F (37.2 C)  98 F (36.7 C) 97.8 F (36.6 C)  TempSrc: Oral  Oral Oral  SpO2: 100% 100% 100% 100%  Weight:    96.6 kg  Height:    5' 1 (1.549 m)   Eyes: Anicteric no pallor. ENMT: No discharge from the ears eyes nose or mouth Neck: No neck rigidity no mass felt. Respiratory: No rhonchi or crepitations. Cardiovascular: S1-S2 heard. Abdomen: Soft nontender bowel sound present. Musculoskeletal: No edema. Skin: No rash. Neurologic: Alert awake oriented time place and person.  Moves all extremities. Psychiatric: Appears normal.  Normal affect.   Labs on Admission: I have personally reviewed following labs and imaging studies  CBC: Recent Labs  Lab 06/14/24 2124  WBC 9.2  HGB 12.5  HCT 38.3  MCV 88.5  PLT 348   Basic Metabolic Panel: Recent Labs  Lab 06/14/24 2124  NA 140  K 3.2*  CL 109  CO2 17*  GLUCOSE 113*  BUN 8  CREATININE 0.78  CALCIUM 9.5   GFR: Estimated Creatinine Clearance: 97.3 mL/min (by C-G formula based on SCr of 0.78 mg/dL). Liver Function Tests: Recent Labs  Lab 06/14/24 2124  AST 15  ALT 10  ALKPHOS 87  BILITOT 0.5  PROT 8.2*  ALBUMIN 4.6   Recent Labs  Lab 06/14/24 2124  LIPASE 29   No results for input(s): AMMONIA in the last 168 hours. Coagulation Profile: No results for input(s): INR, PROTIME in the last 168 hours. Cardiac Enzymes: No results for input(s): CKTOTAL, CKMB, CKMBINDEX,  TROPONINI in the last 168 hours. BNP (last 3 results) No results for input(s): PROBNP in the last 8760 hours. HbA1C: Recent Labs    06/12/24 0934  HGBA1C 5.6   CBG: No results for input(s): GLUCAP in the last 168 hours. Lipid Profile: Recent Labs    06/12/24 0934  CHOL 171  HDL 43  LDLCALC 116*  TRIG 64  CHOLHDL 4.0   Thyroid  Function Tests: No results for input(s): TSH, T4TOTAL, FREET4, T3FREE, THYROIDAB in the last 72 hours. Anemia Panel: Recent Labs    06/12/24 0934  VITAMINB12 102*   Urine analysis:    Component Value Date/Time   COLORURINE YELLOW 12/19/2023 2259   APPEARANCEUR CLEAR 12/19/2023 2259   APPEARANCEUR Clear 05/12/2020  1717   LABSPEC 1.017 12/19/2023 2259   PHURINE 7.0 12/19/2023 2259   GLUCOSEU NEGATIVE 12/19/2023 2259   HGBUR NEGATIVE 12/19/2023 2259   BILIRUBINUR NEGATIVE 12/19/2023 2259   BILIRUBINUR Negative 05/12/2020 1717   KETONESUR NEGATIVE 12/19/2023 2259   PROTEINUR NEGATIVE 12/19/2023 2259   UROBILINOGEN 1.0 08/20/2012 2046   NITRITE POSITIVE (A) 12/19/2023 2259   LEUKOCYTESUR NEGATIVE 12/19/2023 2259   Sepsis Labs: @LABRCNTIP (procalcitonin:4,lacticidven:4) ) Recent Results (from the past 240 hours)  Resp panel by RT-PCR (RSV, Flu A&B, Covid) Anterior Nasal Swab     Status: None   Collection Time: 06/14/24 10:38 PM   Specimen: Anterior Nasal Swab  Result Value Ref Range Status   SARS Coronavirus 2 by RT PCR NEGATIVE NEGATIVE Final    Comment: (NOTE) SARS-CoV-2 target nucleic acids are NOT DETECTED.  The SARS-CoV-2 RNA is generally detectable in upper respiratory specimens during the acute phase of infection. The lowest concentration of SARS-CoV-2 viral copies this assay can detect is 138 copies/mL. A negative result does not preclude SARS-Cov-2 infection and should not be used as the sole basis for treatment or other patient management decisions. A negative result may occur with  improper specimen  collection/handling, submission of specimen other than nasopharyngeal swab, presence of viral mutation(s) within the areas targeted by this assay, and inadequate number of viral copies(<138 copies/mL). A negative result must be combined with clinical observations, patient history, and epidemiological information. The expected result is Negative.  Fact Sheet for Patients:  BloggerCourse.com  Fact Sheet for Healthcare Providers:  SeriousBroker.it  This test is no t yet approved or cleared by the [place]  FDA and  has been authorized for detection and/or diagnosis of SARS-CoV-2 by FDA under an Emergency Use Authorization (EUA). This EUA will remain  in effect (meaning this test can be used) for the duration of the COVID-19 declaration under Section 564(b)(1) of the Act, 21 U.S.C.section 360bbb-3(b)(1), unless the authorization is terminated  or revoked sooner.       Influenza A by PCR NEGATIVE NEGATIVE Final   Influenza B by PCR NEGATIVE NEGATIVE Final    Comment: (NOTE) The Xpert Xpress SARS-CoV-2/FLU/RSV plus assay is intended as an aid in the diagnosis of influenza from Nasopharyngeal swab specimens and should not be used as a sole basis for treatment. Nasal washings and aspirates are unacceptable for Xpert Xpress SARS-CoV-2/FLU/RSV testing.  Fact Sheet for Patients: BloggerCourse.com  Fact Sheet for Healthcare Providers: SeriousBroker.it  This test is not yet approved or cleared by the [place]  FDA and has been authorized for detection and/or diagnosis of SARS-CoV-2 by FDA under an Emergency Use Authorization (EUA). This EUA will remain in effect (meaning this test can be used) for the duration of the COVID-19 declaration under Section 564(b)(1) of the Act, 21 U.S.C. section 360bbb-3(b)(1), unless the authorization is terminated or revoked.     Resp Syncytial  Virus by PCR NEGATIVE NEGATIVE Final    Comment: (NOTE) Fact Sheet for Patients: BloggerCourse.com  Fact Sheet for Healthcare Providers: SeriousBroker.it  This test is not yet approved or cleared by the [place]  FDA and has been authorized for detection and/or diagnosis of SARS-CoV-2 by FDA under an Emergency Use Authorization (EUA). This EUA will remain in effect (meaning this test can be used) for the duration of the COVID-19 declaration under Section 564(b)(1) of the Act, 21 U.S.C. section 360bbb-3(b)(1), unless the authorization is terminated or revoked.  Performed at [facility], [address] [place], [place] [address]  Radiological Exams on Admission: CT ABDOMEN PELVIS W CONTRAST Result Date: 06/15/2024 EXAM: CT ABDOMEN AND PELVIS WITH CONTRAST 06/15/2024 12:00:00 AM TECHNIQUE: CT of the abdomen and pelvis was performed with the administration of intravenous contrast (100mL iohexol  (OMNIPAQUE ) 350 MG/ML injection). Multiplanar reformatted images are provided for review. Automated exposure control, iterative reconstruction, and/or weight-based adjustment of the mA/kV was utilized to reduce the radiation dose to as low as reasonably achievable. COMPARISON: None available. CLINICAL HISTORY: Abdominal pain, acute, nonlocalized. FINDINGS: LOWER CHEST: Nodular infiltrate noted within the right lower lobe with associated airway inflammation, likely infectious or inflammatory in the acute setting. LIVER: The liver is unremarkable. GALLBLADDER AND BILE DUCTS: Gallbladder is unremarkable. No biliary ductal dilatation. SPLEEN: No acute abnormality. PANCREAS: No acute abnormality. ADRENAL GLANDS: No acute abnormality. KIDNEYS, URETERS AND BLADDER: No stones in the kidneys or ureters. No hydronephrosis. No perinephric or periureteral stranding. Urinary bladder is unremarkable. GI AND BOWEL: Appendix absent. Stomach  demonstrates no acute abnormality. There is no bowel obstruction. PERITONEUM AND RETROPERITONEUM: No ascites. No free air. VASCULATURE: Aorta is normal in caliber. LYMPH NODES: No lymphadenopathy. REPRODUCTIVE ORGANS: 3.1 cm simple cyst noted within the right ovary, likely representing a follicular cyst in a patient of this age. According to the Ovarian-Adnexal Reporting and Data System Ultrasound (O-RADS US ), the finding is consistent with O-RADS US  2 (simple cyst 3-5 cm in a premenopausal patient) and the recommendation is no follow-up is required. Small exophytic fundal fibroid noted. BONES AND SOFT TISSUES: No acute osseous abnormality. No focal soft tissue abnormality. IMPRESSION: 1. Nodular infiltrate in the right lower lobe with associated airway inflammation, likely infectious or inflammatory in the acute setting. 2. 3.1 cm simple cyst in the right ovary, consistent with O-RADS US  2. No follow-up required. 3. Small exophytic fundal fibroid. Electronically signed by: Dorethia Molt MD 06/15/2024 12:25 AM EDT RP Workstation: [id number]   CT Angio Chest PE W and/or Wo Contrast Result Date: 06/15/2024 EXAM: CTA of the Chest with contrast for PE 06/15/2024 12:00:00 AM TECHNIQUE: CTA of the chest was performed after the administration of intravenous contrast. Multiplanar reformatted images are provided for review. MIP images are provided for review. Automated exposure control, iterative reconstruction, and/or weight based adjustment of the mA/kV was utilized to reduce the radiation dose to as low as reasonably achievable. COMPARISON: 12/19/2023 CLINICAL HISTORY: Pulmonary embolism (PE) suspected, high prob. FINDINGS: PULMONARY ARTERIES: Pulmonary arteries are adequately opacified for evaluation. No pulmonary embolism. Main pulmonary artery is normal in caliber. MEDIASTINUM: The heart and pericardium demonstrate no acute abnormality. There is no acute abnormality of the thoracic aorta. LYMPH NODES: No mediastinal,  hilar or axillary lymphadenopathy. LUNGS AND PLEURA: Mild nodular pulmonary infiltrate is seen within the right lower lobe, possibly infectious or inflammatory in the acute setting. There is associated asymmetric mild bronchial wall thickening within the right lower lobe and left upper lobe in keeping with airway inflammation. Together, the findings may reflect early changes of multifocal bronchopneumonia. No pneumothorax or pleural effusion. No central obstructing lesion. UPPER ABDOMEN: Limited images of the upper abdomen are unremarkable. SOFT TISSUES AND BONES: No acute bone or soft tissue abnormality. IMPRESSION: 1. No evidence of pulmonary embolism. 2. Mild nodular pulmonary infiltrate in the right lower lobe with associated airway inflammation, possibly representing early multifocal bronchopneumonia. Electronically signed by: Dorethia Molt MD 06/15/2024 12:20 AM EDT RP Workstation: [id number]   CT Head Wo Contrast Result Date: 06/15/2024 EXAM: CT HEAD WITHOUT CONTRAST 06/15/2024 12:00:00 AM TECHNIQUE: CT of the head  was performed without the administration of intravenous contrast. Automated exposure control, iterative reconstruction, and/or weight based adjustment of the mA/kV was utilized to reduce the radiation dose to as low as reasonably achievable. COMPARISON: None available. CLINICAL HISTORY: Headache, neuro deficit. FINDINGS: BRAIN AND VENTRICLES: No acute hemorrhage. No evidence of acute infarct. No hydrocephalus. No extra-axial collection. No mass effect or midline shift. ORBITS: No acute abnormality. SINUSES: No acute abnormality. SOFT TISSUES AND SKULL: No acute soft tissue abnormality. No skull fracture. IMPRESSION: 1. No acute intracranial abnormality. Electronically signed by: Dorethia Molt MD 06/15/2024 12:14 AM EDT RP Workstation: [id number]   DG Chest 2 View Result Date: 06/14/2024 CLINICAL DATA:  Shortness of breath, chest pain, vomiting EXAM: CHEST - 2 VIEW COMPARISON:  Chest x-ray  12/19/2023, CT chest 12/19/2023 FINDINGS: The heart and mediastinal contours are within normal limits. No focal consolidation. No pulmonary edema. No pleural effusion. No pneumothorax. No acute osseous abnormality. IMPRESSION: No active cardiopulmonary disease. Electronically Signed   By: Morgane  Naveau M.D.   On: 06/14/2024 22:01    EKG: Independently reviewed.  Normal sinus rhythm bigeminy.  Assessment/Plan Principal Problem:   Nausea & vomiting Active Problems:   SLE (systemic lupus erythematosus) (HCC)   Fibromyalgia   Tobacco abuse   Idiopathic intracranial hypertension   Raynaud disease   Neuropathy    Intractable nausea vomiting -     suspect viral gastroenteritis.  CT abdomen pelvis is unremarkable LFTs are normal.  Patient willing to try diet now.  Advance as tolerated. Possible pneumonia seen on the CAT scan.  Has nonproductive cough.  No blood-tinged sputum.  May be secondary to aspiration from vomiting.  Will continue with empiric antibiotics for now. Idiopathic intracranial hypertension on acetazolamide .  Patient states increasing headache in the last 2 days.  4 days ago patient had visited ophthalmologist and per patient ophthalmologist had said that there was no papilledema.  I discussed with on-call neurologist Dr. Lindzen who advised to treat the acute condition including pneumonia and nausea vomiting and to see if the headache improves.  If not then to reconsult neurology. SLE on Plaquenil  and Imuran  was recently started.  Patient is presently afebrile.  So we will continue the immunosuppressants for now.  Patient also takes Saphnelo .  Will check sed rate and CRP.  Complains of chest pain mostly related to vomiting.  Given the prior history of pericardial effusion will check 2D echo. History of hypertension and Raynaud's disease on amlodipine . History of neuropathy and fibromyalgia on Cymbalta  and Lyrica . Mild hypokalemia likely related to vomiting replace and  recheck. Obesity - patient's primary care physician was planning to start Mounjaro  but patient has still not started taking it.  Since patient has intractable nausea vomiting with possible pneumonia will need close monitoring further workup and more than 2 midnight stay   DVT prophylaxis: SCDs.  If headache improves and if no procedures planned for headache then start Lovenox . Code Status: Full code. Family Communication: None at the bedside. Disposition Plan: Monitored bed. Consults called: Discussed with neurologist. Admission status: Observation.

## 2024-06-15 NOTE — ED Notes (Signed)
 Patient resting in bed.

## 2024-06-15 NOTE — ED Notes (Signed)
 ED TO INPATIENT HANDOFF REPORT  Name/Age/Gender Diana Huynh 43 y.o. female  Code Status Code Status History     Date Active Date Inactive Code Status Order ID Comments User Context   02/05/2019 1659 02/08/2019 1628 Full Code [id number]  Francesco Elsie NOVAK, MD ED   12/27/2017 1639 12/31/2017 1724 Full Code [id number]  Molt, Heather, DO ED       Home/SNF/Other Home  Chief Complaint Nausea & vomiting [R11.2]  Level of Care/Admitting Diagnosis ED Disposition     ED Disposition  Admit   Condition  --   Comment  Hospital Area: [facility] [[id number]]  Level of Care: Telemetry [5]  Admit to tele based on following criteria: Monitor for Ischemic changes  May place patient in observation at [facility] or [facility] if equivalent level of care is available:: No  Covid Evaluation: Asymptomatic - no recent exposure (last 10 days) testing not required  Diagnosis: Nausea & vomiting [277057]  Admitting Physician: FRANKY REDIA SAILOR [phone]  Attending Physician: FRANKY REDIA SAILOR [phone]  For patients discharging to extended facilities (i.e. SNF, AL, group homes or LTAC) initiate:: Discharge to SNF/Facility Placement COVID-19 Lab Testing Protocol          Medical History Past Medical History:  Diagnosis Date   Bronchitis    Fibromyalgia    Lupus    Pericarditis    per pt report   Pleuritis 12/31/2017    Allergies No Known Allergies  IV Location/Drains/Wounds Patient Lines/Drains/Airways Status     Active Line/Drains/Airways     Name Placement date Placement time Site Days   Peripheral IV 06/14/24 20 G Left Antecubital 06/14/24  2228  Antecubital  1            Labs/Imaging Results for orders placed or performed during the hospital encounter of 06/14/24 (from the past 48 hours)  Basic metabolic panel     Status: Abnormal   Collection Time: 06/14/24  9:24 PM  Result Value Ref Range   Sodium 140 135 - 145 mmol/L   Potassium 3.2 (L) 3.5 - 5.1  mmol/L   Chloride 109 98 - 111 mmol/L   CO2 17 (L) 22 - 32 mmol/L   Glucose, Bld 113 (H) 70 - 99 mg/dL    Comment: Glucose reference range applies only to samples taken after fasting for at least 8 hours.   BUN 8 6 - 20 mg/dL   Creatinine, Ser 9.21 0.44 - 1.00 mg/dL   Calcium 9.5 8.9 - 10.3 mg/dL   GFR, Estimated >39 >39 mL/min    Comment: (NOTE) Calculated using the CKD-EPI Creatinine Equation (2021)    Anion gap 14 5 - 15    Comment: Performed at [facility], [address], [place], [place] [address]  CBC     Status: Abnormal   Collection Time: 06/14/24  9:24 PM  Result Value Ref Range   WBC 9.2 4.0 - 10.5 K/uL   RBC 4.33 3.87 - 5.11 MIL/uL   Hemoglobin 12.5 12.0 - 15.0 g/dL   HCT 61.6 63.9 - 53.9 %   MCV 88.5 80.0 - 100.0 fL   MCH 28.9 26.0 - 34.0 pg   MCHC 32.6 30.0 - 36.0 g/dL   RDW 80.7 (H) 88.4 - 84.4 %   Platelets 348 150 - 400 K/uL   nRBC 0.0 0.0 - 0.2 %    Comment: Performed at [facility], [address] [place], [place] [address]  Troponin T, High  Sensitivity     Status: None   Collection Time: 06/14/24  9:24 PM  Result Value Ref Range   Troponin T High Sensitivity <15 0 - 19 ng/L    Comment: (NOTE) Biotin concentrations > 1000 ng/mL falsely decrease TnT results.  Serial cardiac troponin measurements are suggested.  Refer to the Links section for chest pain algorithms and additional  guidance. Performed at [facility], [address] [place], [place] [address]   hCG, serum, qualitative     Status: None   Collection Time: 06/14/24  9:24 PM  Result Value Ref Range   Preg, Serum NEGATIVE NEGATIVE    Comment:        THE SENSITIVITY OF THIS METHODOLOGY IS >10 mIU/mL. Performed at [facility], [address], [place], [place] [address]   Hepatic function panel     Status: Abnormal   Collection Time: 06/14/24  9:24 PM  Result Value Ref Range   Total Protein 8.2 (H) 6.5 - 8.1  g/dL   Albumin 4.6 3.5 - 5.0 g/dL   AST 15 15 - 41 U/L   ALT 10 0 - 44 U/L   Alkaline Phosphatase 87 38 - 126 U/L   Total Bilirubin 0.5 0.0 - 1.2 mg/dL   Bilirubin, Direct 0.2 0.0 - 0.2 mg/dL   Indirect Bilirubin 0.3 0.3 - 0.9 mg/dL    Comment: Performed at [facility], [address] [place], [place] [address]  Lipase, blood     Status: None   Collection Time: 06/14/24  9:24 PM  Result Value Ref Range   Lipase 29 11 - 51 U/L    Comment: Performed at [facility], [address], [place], [place] [address]  Resp panel by RT-PCR (RSV, Flu A&B, Covid) Anterior Nasal Swab     Status: None   Collection Time: 06/14/24 10:38 PM   Specimen: Anterior Nasal Swab  Result Value Ref Range   SARS Coronavirus 2 by RT PCR NEGATIVE NEGATIVE    Comment: (NOTE) SARS-CoV-2 target nucleic acids are NOT DETECTED.  The SARS-CoV-2 RNA is generally detectable in upper respiratory specimens during the acute phase of infection. The lowest concentration of SARS-CoV-2 viral copies this assay can detect is 138 copies/mL. A negative result does not preclude SARS-Cov-2 infection and should not be used as the sole basis for treatment or other patient management decisions. A negative result may occur with  improper specimen collection/handling, submission of specimen other than nasopharyngeal swab, presence of viral mutation(s) within the areas targeted by this assay, and inadequate number of viral copies(<138 copies/mL). A negative result must be combined with clinical observations, patient history, and epidemiological information. The expected result is Negative.  Fact Sheet for Patients:  BloggerCourse.com  Fact Sheet for Healthcare Providers:  SeriousBroker.it  This test is no t yet approved or cleared by the [place]  FDA and  has been authorized for detection and/or diagnosis of SARS-CoV-2 by FDA under an  Emergency Use Authorization (EUA). This EUA will remain  in effect (meaning this test can be used) for the duration of the COVID-19 declaration under Section 564(b)(1) of the Act, 21 U.S.C.section 360bbb-3(b)(1), unless the authorization is terminated  or revoked sooner.       Influenza A by PCR NEGATIVE NEGATIVE   Influenza B by PCR NEGATIVE NEGATIVE    Comment: (NOTE) The Xpert Xpress SARS-CoV-2/FLU/RSV plus assay is intended as an aid in the diagnosis of influenza from Nasopharyngeal swab specimens  and should not be used as a sole basis for treatment. Nasal washings and aspirates are unacceptable for Xpert Xpress SARS-CoV-2/FLU/RSV testing.  Fact Sheet for Patients: BloggerCourse.com  Fact Sheet for Healthcare Providers: SeriousBroker.it  This test is not yet approved or cleared by the [place]  FDA and has been authorized for detection and/or diagnosis of SARS-CoV-2 by FDA under an Emergency Use Authorization (EUA). This EUA will remain in effect (meaning this test can be used) for the duration of the COVID-19 declaration under Section 564(b)(1) of the Act, 21 U.S.C. section 360bbb-3(b)(1), unless the authorization is terminated or revoked.     Resp Syncytial Virus by PCR NEGATIVE NEGATIVE    Comment: (NOTE) Fact Sheet for Patients: BloggerCourse.com  Fact Sheet for Healthcare Providers: SeriousBroker.it  This test is not yet approved or cleared by the [place]  FDA and has been authorized for detection and/or diagnosis of SARS-CoV-2 by FDA under an Emergency Use Authorization (EUA). This EUA will remain in effect (meaning this test can be used) for the duration of the COVID-19 declaration under Section 564(b)(1) of the Act, 21 U.S.C. section 360bbb-3(b)(1), unless the authorization is terminated or revoked.  Performed at [facility], [address], [place], [place] [address]    CT ABDOMEN PELVIS W CONTRAST Result Date: 06/15/2024 EXAM: CT ABDOMEN AND PELVIS WITH CONTRAST 06/15/2024 12:00:00 AM TECHNIQUE: CT of the abdomen and pelvis was performed with the administration of intravenous contrast (100mL iohexol  (OMNIPAQUE ) 350 MG/ML injection). Multiplanar reformatted images are provided for review. Automated exposure control, iterative reconstruction, and/or weight-based adjustment of the mA/kV was utilized to reduce the radiation dose to as low as reasonably achievable. COMPARISON: None available. CLINICAL HISTORY: Abdominal pain, acute, nonlocalized. FINDINGS: LOWER CHEST: Nodular infiltrate noted within the right lower lobe with associated airway inflammation, likely infectious or inflammatory in the acute setting. LIVER: The liver is unremarkable. GALLBLADDER AND BILE DUCTS: Gallbladder is unremarkable. No biliary ductal dilatation. SPLEEN: No acute abnormality. PANCREAS: No acute abnormality. ADRENAL GLANDS: No acute abnormality. KIDNEYS, URETERS AND BLADDER: No stones in the kidneys or ureters. No hydronephrosis. No perinephric or periureteral stranding. Urinary bladder is unremarkable. GI AND BOWEL: Appendix absent. Stomach demonstrates no acute abnormality. There is no bowel obstruction. PERITONEUM AND RETROPERITONEUM: No ascites. No free air. VASCULATURE: Aorta is normal in caliber. LYMPH NODES: No lymphadenopathy. REPRODUCTIVE ORGANS: 3.1 cm simple cyst noted within the right ovary, likely representing a follicular cyst in a patient of this age. According to the Ovarian-Adnexal Reporting and Data System Ultrasound (O-RADS US ), the finding is consistent with O-RADS US  2 (simple cyst 3-5 cm in a premenopausal patient) and the recommendation is no follow-up is required. Small exophytic fundal fibroid noted. BONES AND SOFT TISSUES: No acute osseous abnormality. No focal soft tissue abnormality. IMPRESSION: 1. Nodular infiltrate in the right  lower lobe with associated airway inflammation, likely infectious or inflammatory in the acute setting. 2. 3.1 cm simple cyst in the right ovary, consistent with O-RADS US  2. No follow-up required. 3. Small exophytic fundal fibroid. Electronically signed by: Dorethia Molt MD 06/15/2024 12:25 AM EDT RP Workstation: [id number]   CT Angio Chest PE W and/or Wo Contrast Result Date: 06/15/2024 EXAM: CTA of the Chest with contrast for PE 06/15/2024 12:00:00 AM TECHNIQUE: CTA of the chest was performed after the administration of intravenous contrast. Multiplanar reformatted images are provided for review. MIP images are provided for review. Automated exposure control, iterative reconstruction, and/or weight based adjustment of the mA/kV was utilized  to reduce the radiation dose to as low as reasonably achievable. COMPARISON: 12/19/2023 CLINICAL HISTORY: Pulmonary embolism (PE) suspected, high prob. FINDINGS: PULMONARY ARTERIES: Pulmonary arteries are adequately opacified for evaluation. No pulmonary embolism. Main pulmonary artery is normal in caliber. MEDIASTINUM: The heart and pericardium demonstrate no acute abnormality. There is no acute abnormality of the thoracic aorta. LYMPH NODES: No mediastinal, hilar or axillary lymphadenopathy. LUNGS AND PLEURA: Mild nodular pulmonary infiltrate is seen within the right lower lobe, possibly infectious or inflammatory in the acute setting. There is associated asymmetric mild bronchial wall thickening within the right lower lobe and left upper lobe in keeping with airway inflammation. Together, the findings may reflect early changes of multifocal bronchopneumonia. No pneumothorax or pleural effusion. No central obstructing lesion. UPPER ABDOMEN: Limited images of the upper abdomen are unremarkable. SOFT TISSUES AND BONES: No acute bone or soft tissue abnormality. IMPRESSION: 1. No evidence of pulmonary embolism. 2. Mild nodular pulmonary infiltrate in the right lower lobe with  associated airway inflammation, possibly representing early multifocal bronchopneumonia. Electronically signed by: Dorethia Molt MD 06/15/2024 12:20 AM EDT RP Workstation: [id number]   CT Head Wo Contrast Result Date: 06/15/2024 EXAM: CT HEAD WITHOUT CONTRAST 06/15/2024 12:00:00 AM TECHNIQUE: CT of the head was performed without the administration of intravenous contrast. Automated exposure control, iterative reconstruction, and/or weight based adjustment of the mA/kV was utilized to reduce the radiation dose to as low as reasonably achievable. COMPARISON: None available. CLINICAL HISTORY: Headache, neuro deficit. FINDINGS: BRAIN AND VENTRICLES: No acute hemorrhage. No evidence of acute infarct. No hydrocephalus. No extra-axial collection. No mass effect or midline shift. ORBITS: No acute abnormality. SINUSES: No acute abnormality. SOFT TISSUES AND SKULL: No acute soft tissue abnormality. No skull fracture. IMPRESSION: 1. No acute intracranial abnormality. Electronically signed by: Dorethia Molt MD 06/15/2024 12:14 AM EDT RP Workstation: [id number]   DG Chest 2 View Result Date: 06/14/2024 CLINICAL DATA:  Shortness of breath, chest pain, vomiting EXAM: CHEST - 2 VIEW COMPARISON:  Chest x-ray 12/19/2023, CT chest 12/19/2023 FINDINGS: The heart and mediastinal contours are within normal limits. No focal consolidation. No pulmonary edema. No pleural effusion. No pneumothorax. No acute osseous abnormality. IMPRESSION: No active cardiopulmonary disease. Electronically Signed   By: Morgane  Naveau M.D.   On: 06/14/2024 22:01    Pending Labs Unresulted Labs (From admission, onward)     Start     Ordered   06/15/24 0131  Blood culture (routine x 2)  BLOOD CULTURE X 2,   R (with STAT occurrences)      06/15/24 0131   06/15/24 0131  Lactic acid, plasma  (Lactic Acid)  STAT Now then every 3 hours,   R (with STAT occurrences)      06/15/24 0131            Vitals/Pain Today's Vitals   06/14/24 2116  06/14/24 2228 06/15/24 0044 06/15/24 0050  BP:    130/67  Pulse:    86  Resp:    18  Temp:    98 F (36.7 C)  TempSrc:    Oral  SpO2: 100%   100%  PainSc: 8  8  7       Isolation Precautions No active isolations  Medications Medications  cefTRIAXone  (ROCEPHIN ) 1 g in sodium chloride  0.9 % 100 mL IVPB (has no administration in time range)  azithromycin  (ZITHROMAX ) 500 mg in sodium chloride  0.9 % 250 mL IVPB (has no administration in time range)  ondansetron  (ZOFRAN ) injection 4 mg (4 mg  Intravenous Given 06/14/24 2229)  fentaNYL  (SUBLIMAZE ) injection 50 mcg (50 mcg Intravenous Given 06/14/24 2228)  sodium chloride  0.9 % bolus 1,000 mL (0 mLs Intravenous Stopped 06/15/24 0123)  iohexol  (OMNIPAQUE ) 350 MG/ML injection 100 mL (100 mLs Intravenous Contrast Given 06/15/24 0011)  HYDROmorphone  (DILAUDID ) injection 1 mg (1 mg Intravenous Given 06/15/24 0044)  ondansetron  (ZOFRAN ) injection 4 mg (4 mg Intravenous Given 06/15/24 0122)    Mobility walks with person assist

## 2024-06-16 DIAGNOSIS — G629 Polyneuropathy, unspecified: Secondary | ICD-10-CM | POA: Diagnosis not present

## 2024-06-16 DIAGNOSIS — G932 Benign intracranial hypertension: Secondary | ICD-10-CM | POA: Diagnosis not present

## 2024-06-16 DIAGNOSIS — Z803 Family history of malignant neoplasm of breast: Secondary | ICD-10-CM | POA: Diagnosis not present

## 2024-06-16 DIAGNOSIS — Z79899 Other long term (current) drug therapy: Secondary | ICD-10-CM | POA: Diagnosis not present

## 2024-06-16 DIAGNOSIS — R911 Solitary pulmonary nodule: Secondary | ICD-10-CM | POA: Diagnosis not present

## 2024-06-16 DIAGNOSIS — A084 Viral intestinal infection, unspecified: Secondary | ICD-10-CM | POA: Diagnosis not present

## 2024-06-16 DIAGNOSIS — Z8049 Family history of malignant neoplasm of other genital organs: Secondary | ICD-10-CM | POA: Diagnosis not present

## 2024-06-16 DIAGNOSIS — E66813 Obesity, class 3: Secondary | ICD-10-CM | POA: Diagnosis not present

## 2024-06-16 DIAGNOSIS — Z6841 Body Mass Index (BMI) 40.0 and over, adult: Secondary | ICD-10-CM | POA: Diagnosis not present

## 2024-06-16 DIAGNOSIS — Z8349 Family history of other endocrine, nutritional and metabolic diseases: Secondary | ICD-10-CM | POA: Diagnosis not present

## 2024-06-16 DIAGNOSIS — M797 Fibromyalgia: Secondary | ICD-10-CM | POA: Diagnosis not present

## 2024-06-16 DIAGNOSIS — R112 Nausea with vomiting, unspecified: Secondary | ICD-10-CM | POA: Diagnosis not present

## 2024-06-16 DIAGNOSIS — M329 Systemic lupus erythematosus, unspecified: Secondary | ICD-10-CM | POA: Diagnosis not present

## 2024-06-16 DIAGNOSIS — I7300 Raynaud's syndrome without gangrene: Secondary | ICD-10-CM | POA: Diagnosis not present

## 2024-06-16 DIAGNOSIS — Z7985 Long-term (current) use of injectable non-insulin antidiabetic drugs: Secondary | ICD-10-CM | POA: Diagnosis not present

## 2024-06-16 DIAGNOSIS — I10 Essential (primary) hypertension: Secondary | ICD-10-CM | POA: Diagnosis not present

## 2024-06-16 DIAGNOSIS — Z8249 Family history of ischemic heart disease and other diseases of the circulatory system: Secondary | ICD-10-CM | POA: Diagnosis not present

## 2024-06-16 DIAGNOSIS — Z1152 Encounter for screening for COVID-19: Secondary | ICD-10-CM | POA: Diagnosis not present

## 2024-06-16 DIAGNOSIS — J189 Pneumonia, unspecified organism: Secondary | ICD-10-CM | POA: Diagnosis not present

## 2024-06-16 DIAGNOSIS — F17210 Nicotine dependence, cigarettes, uncomplicated: Secondary | ICD-10-CM | POA: Diagnosis not present

## 2024-06-16 DIAGNOSIS — E876 Hypokalemia: Secondary | ICD-10-CM | POA: Diagnosis not present

## 2024-06-16 MED ORDER — AMLODIPINE BESYLATE 10 MG PO TABS
10.0000 mg | ORAL_TABLET | Freq: Once | ORAL | Status: AC
  Administered 2024-06-16: 10 mg via ORAL
  Filled 2024-06-16: qty 1

## 2024-06-16 MED ORDER — BUTALBITAL-APAP-CAFFEINE 50-325-40 MG PO TABS
1.0000 | ORAL_TABLET | Freq: Four times a day (QID) | ORAL | Status: DC | PRN
  Administered 2024-06-16 – 2024-06-17 (×3): 2 via ORAL
  Filled 2024-06-16 (×3): qty 2

## 2024-06-16 MED ORDER — VITAMIN B-12 1000 MCG PO TABS
1000.0000 ug | ORAL_TABLET | Freq: Every day | ORAL | Status: DC
  Administered 2024-06-16 – 2024-06-17 (×2): 1000 ug via ORAL
  Filled 2024-06-16 (×2): qty 1

## 2024-06-16 MED ORDER — AMLODIPINE BESYLATE 10 MG PO TABS
15.0000 mg | ORAL_TABLET | Freq: Every day | ORAL | Status: DC
  Administered 2024-06-17: 15 mg via ORAL
  Filled 2024-06-16: qty 1

## 2024-06-16 MED ORDER — SULINDAC 200 MG PO TABS
200.0000 mg | ORAL_TABLET | Freq: Two times a day (BID) | ORAL | Status: DC
  Administered 2024-06-16 – 2024-06-17 (×3): 200 mg via ORAL
  Filled 2024-06-16 (×3): qty 1

## 2024-06-16 MED ORDER — MELATONIN 5 MG PO TABS
5.0000 mg | ORAL_TABLET | Freq: Once | ORAL | Status: AC
  Administered 2024-06-16: 5 mg via ORAL
  Filled 2024-06-16: qty 1

## 2024-06-16 NOTE — Progress Notes (Addendum)
  Progress Note   Patient: Diana Huynh FMW:[id number] DOB: Apr 20, 1981 DOA: 06/14/2024     0 DOS: the patient was seen and examined on 06/16/2024   Brief hospital course: 43 year old woman PMH including SLE, idiopathic intracranial hypertension, presented with nausea, vomiting, headache.  Consultants None   Procedures/Events None   Assessment and Plan: Intractable nausea and vomiting Resolved.  Consideration given to viral gastroenteritis.  LFTs were unremarkable. Now with some nausea and vomiting again.  Exam benign.  Treat symptomatically.   Possible pneumonia Inflammatory or infectious nodule on chest CT Continue empiric treatment.  No oxygen requirement.   Idiopathic intracranial hypertension  Continue acetazolamide  Recently seen by ophthalmologist, no papilledema Headache variable.  Resume Fioricet  as needed.   SLE on Plaquenil  and Imuran  was recently started.   Patient also takes Saphnelo .  No pericardial effusion on echo   Raynaud's disease  Hypertension Takes amlodipine  for 15 mg daily for Raynaud's syndrome.  Resume home dosing   Neuropathy and fibromyalgia on Cymbalta  and Lyrica .   Obesity class III- patient's primary care physician was planning to start Mounjaro  but patient has still not started taking it. Body mass index is 40.23 kg/m.   Given lack of diet tolerance, hold discharge today       Subjective:  Nauseous today and really not able to eat anything, vomited.  Has some headache.  Reports Raynaud's and hand developing.  Wants to restart home medications including sulindac  and amlodipine  at higher dose.  Physical Exam: Vitals:   06/15/24 0741 06/15/24 1202 06/15/24 2216 06/16/24 0457  BP: (!) 147/79 114/70 134/80 114/75  Pulse: 72 73 75 69  Resp: 18 17 20 19   Temp: 99.3 F (37.4 C) 98.2 F (36.8 C) 98.2 F (36.8 C) 98.2 F (36.8 C)  TempSrc: Oral Oral Oral Oral  SpO2: 100% 99% 100% 100%  Weight:      Height:       Physical Exam Vitals  reviewed.  Constitutional:      General: She is not in acute distress.    Appearance: She is not ill-appearing or toxic-appearing.  Cardiovascular:     Rate and Rhythm: Normal rate and regular rhythm.     Heart sounds: No murmur heard. Pulmonary:     Effort: Pulmonary effort is normal. No respiratory distress.     Breath sounds: No wheezing, rhonchi or rales.  Neurological:     Mental Status: She is alert.  Psychiatric:        Mood and Affect: Mood normal.        Behavior: Behavior normal.     Data Reviewed: No new data Family Communication: Husband at bedside  Disposition: Status is: Inpatient      Time spent: 25 minutes  Author: Toribio Door, MD 06/16/2024 8:27 AM  For on call review [email or web address].

## 2024-06-17 DIAGNOSIS — R112 Nausea with vomiting, unspecified: Secondary | ICD-10-CM | POA: Diagnosis not present

## 2024-06-17 LAB — CULTURE, BLOOD (ROUTINE X 2)

## 2024-06-17 MED ORDER — BUTALBITAL-APAP-CAFFEINE 50-325-40 MG PO TABS: 1.0000 | ORAL_TABLET | Freq: Four times a day (QID) | ORAL | 0 refills | Status: AC | PRN

## 2024-06-17 MED ORDER — CYANOCOBALAMIN 1000 MCG PO TABS: 1000.0000 ug | ORAL_TABLET | Freq: Every day | ORAL | 0 refills | Status: AC

## 2024-06-17 MED ORDER — ONDANSETRON 4 MG PO TBDP: 4.0000 mg | ORAL_TABLET | Freq: Three times a day (TID) | ORAL | 0 refills | Status: AC | PRN

## 2024-06-17 NOTE — Discharge Summary (Signed)
 Physician Discharge Summary  Diana Huynh FMW:[id number] DOB: 1980-11-12 DOA: 06/14/2024  PCP: Bernadine Manos, MD  Admit date: 06/14/2024 Discharge date: 06/17/2024  Admitted From: Home  Discharge disposition: Home   Recommendations for Outpatient Follow-Up:   Follow up with your primary care provider in one week.  Check CBC, BMP, magnesium in the next visit Follow-up with neurology as outpatient for headache.   Discharge Diagnosis:   Principal Problem:   Nausea & vomiting Active Problems:   SLE (systemic lupus erythematosus) (HCC)   Fibromyalgia   Tobacco abuse   Idiopathic intracranial hypertension   Raynaud disease   Neuropathy   Pneumonia   Intractable nausea and vomiting    Discharge Condition: Improved.  Diet recommendation: Low sodium, heart healthy.    Wound care: None.  Code status: Full.   History of Present Illness:   Patient is a 43 year old female with past medical history of SLE, idiopathic intracranial hypertension, Raynaud's disease, neuropathy, hypertension fibromyalgia, presented to hospital with nausea, vomiting, headache.  Patient was then admitted hospital for further evaluation and treatment.  Hospital Course:   Following conditions were addressed during hospitalization as listed below,  Intractable nausea and vomiting Thought to be secondary to viral gastroenteritis.  LFTs were within normal limits.  Received symptomatic care.  At this time patient has improved symptoms without any nausea or vomiting   Inflammatory or infectious nodule on chest CT.  Not requiring supplemental oxygen.  Was on empiric antibiotics.  Will discontinue on discharge.  Pneumonia ruled out   Idiopathic intracranial hypertension  Continue acetazolamide .  Patient was recently seen by ophthalmology as outpatient and no papilledema was noted.  On Fioricet  at home which was continued.  Patient has an appointment with the neurology for follow-up which I encouraged  her to keep the appointment.   SLE  on Plaquenil  and Imuran ,Saphnelo .  Will continue on discharge.   Raynaud's disease  Hypertension Continue amlodipine  15 mg daily for Raynaud's syndrome.  Neuropathy and fibromyalgia continue Cymbalta  and Lyrica .   Obesity class III-  Body mass index is 40.23 kg/m.  PCP was planning to start the patient on Mounjaro  but patient has still not started taking it.  Lifestyle mo  Disposition.  At this time, patient is stable for disposition home with outpatient PCP/neurology follow-up  Medical Consultants:   None.  Procedures:    None Subjective:   Today, patient was seen and examined at bedside.  Complains of mild headache but no nausea, vomiting and has tolerated oral diet.   Discharge Exam:   Vitals:   06/16/24 2205 06/17/24 0529  BP: 129/77 114/75  Pulse: 80 72  Resp: 20 20  Temp: 98.1 F (36.7 C) 97.8 F (36.6 C)  SpO2: 100% 100%   Vitals:   06/16/24 0457 06/16/24 1306 06/16/24 2205 06/17/24 0529  BP: 114/75 116/74 129/77 114/75  Pulse: 69 72 80 72  Resp: 19 18 20 20   Temp: 98.2 F (36.8 C) 98.6 F (37 C) 98.1 F (36.7 C) 97.8 F (36.6 C)  TempSrc: Oral Oral Oral Oral  SpO2: 100% 100% 100% 100%  Weight:      Height:       Body mass index is 40.23 kg/m.  General: Alert awake, not in obvious distress, obese built HENT: pupils equally reacting to light,  No scleral pallor or icterus noted. Oral mucosa is moist.  Chest:   Diminished breath sounds bilaterally. No crackles or wheezes.  CVS: S1 &S2 heard. No murmur.  Regular rate  and rhythm. Abdomen: Soft, nontender, nondistended.  Bowel sounds are heard.   Extremities: No cyanosis, clubbing or edema.  Peripheral pulses are palpable. Psych: Alert, awake and oriented, normal mood CNS:  No cranial nerve deficits.  Power equal in all extremities.   Skin: Warm and dry.  No rashes noted.  The results of significant diagnostics from this hospitalization (including imaging,  microbiology, ancillary and laboratory) are listed below for reference.     Diagnostic Studies:   ECHOCARDIOGRAM COMPLETE Result Date: 06/15/2024    ECHOCARDIOGRAM REPORT   Patient Name:   Diana Huynh Date of Exam: 06/15/2024 Medical Rec #:  [id number]    Height:       61.0 in Accession #:    [id number]   Weight:       212.9 lb Date of Birth:  Aug 08, 1981     BSA:          1.940 m Patient Age:    43 years     BP:           147/79 mmHg Patient Gender: F            HR:           70 bpm. Exam Location:  Inpatient Procedure: 2D Echo, Cardiac Doppler and Color Doppler (Both Spectral and Color            Flow Doppler were utilized during procedure). Indications:    Chest Pain R07.9  History:        Patient has prior history of Echocardiogram examinations, most                 recent 02/06/2019.  Sonographer:    Tinnie Gosling RDCS Referring Phys: [id number] ARSHAD N KAKRAKANDY IMPRESSIONS  1. Left ventricular ejection fraction, by estimation, is 60 to 65%. The left ventricle has normal function. The left ventricle has no regional wall motion abnormalities. Left ventricular diastolic parameters were normal.  2. Right ventricular systolic function is normal. The right ventricular size is normal.  3. The mitral valve is normal in structure. No evidence of mitral valve regurgitation. No evidence of mitral stenosis.  4. The aortic valve is normal in structure. Aortic valve regurgitation is not visualized. No aortic stenosis is present.  5. The inferior vena cava is normal in size with greater than 50% respiratory variability, suggesting right atrial pressure of 3 mmHg. FINDINGS  Left Ventricle: Left ventricular ejection fraction, by estimation, is 60 to 65%. The left ventricle has normal function. The left ventricle has no regional wall motion abnormalities. The left ventricular internal cavity size was normal in size. There is  no left ventricular hypertrophy. Left ventricular diastolic parameters were normal. Right Ventricle: The  right ventricular size is normal. No increase in right ventricular wall thickness. Right ventricular systolic function is normal. Left Atrium: Left atrial size was normal in size. Right Atrium: Right atrial size was normal in size. Pericardium: There is no evidence of pericardial effusion. Mitral Valve: The mitral valve is normal in structure. No evidence of mitral valve regurgitation. No evidence of mitral valve stenosis. Tricuspid Valve: The tricuspid valve is normal in structure. Tricuspid valve regurgitation is not demonstrated. No evidence of tricuspid stenosis. Aortic Valve: The aortic valve is normal in structure. Aortic valve regurgitation is not visualized. No aortic stenosis is present. Pulmonic Valve: The pulmonic valve was normal in structure. Pulmonic valve regurgitation is not visualized. No evidence of pulmonic stenosis. Aorta: The aortic root is normal in size and  structure. Venous: The inferior vena cava is normal in size with greater than 50% respiratory variability, suggesting right atrial pressure of 3 mmHg. IAS/Shunts: No atrial level shunt detected by color flow Doppler.  LEFT VENTRICLE PLAX 2D LVIDd:         5.00 cm   Diastology LVIDs:         3.40 cm   LV e' medial:    11.30 cm/s LV PW:         1.00 cm   LV E/e' medial:  8.1 LV IVS:        1.00 cm   LV e' lateral:   14.90 cm/s LVOT diam:     1.90 cm   LV E/e' lateral: 6.1 LV SV:         68 LV SV Index:   35 LVOT Area:     2.84 cm  RIGHT VENTRICLE         IVC TAPSE (M-mode): 2.4 cm  IVC diam: 1.50 cm LEFT ATRIUM             Index        RIGHT ATRIUM           Index LA diam:        3.20 cm 1.65 cm/m   RA Area:     11.00 cm LA Vol (A2C):   43.5 ml 22.43 ml/m  RA Volume:   26.60 ml  13.71 ml/m LA Vol (A4C):   43.2 ml 22.27 ml/m LA Biplane Vol: 44.4 ml 22.89 ml/m  AORTIC VALVE LVOT Vmax:   131.00 cm/s LVOT Vmean:  91.900 cm/s LVOT VTI:    0.240 m  AORTA Ao Root diam: 2.40 cm Ao Asc diam:  2.60 cm MITRAL VALVE MV Area (PHT): 3.33 cm     SHUNTS MV Decel Time: 228 msec    Systemic VTI:  0.24 m MV E velocity: 91.30 cm/s  Systemic Diam: 1.90 cm MV A velocity: 79.80 cm/s MV E/A ratio:  1.14 Aditya Sabharwal Electronically signed by Ria Commander Signature Date/Time: 06/15/2024/1:12:28 PM    Final    CT ABDOMEN PELVIS W CONTRAST Result Date: 06/15/2024 EXAM: CT ABDOMEN AND PELVIS WITH CONTRAST 06/15/2024 12:00:00 AM TECHNIQUE: CT of the abdomen and pelvis was performed with the administration of intravenous contrast (100mL iohexol  (OMNIPAQUE ) 350 MG/ML injection). Multiplanar reformatted images are provided for review. Automated exposure control, iterative reconstruction, and/or weight-based adjustment of the mA/kV was utilized to reduce the radiation dose to as low as reasonably achievable. COMPARISON: None available. CLINICAL HISTORY: Abdominal pain, acute, nonlocalized. FINDINGS: LOWER CHEST: Nodular infiltrate noted within the right lower lobe with associated airway inflammation, likely infectious or inflammatory in the acute setting. LIVER: The liver is unremarkable. GALLBLADDER AND BILE DUCTS: Gallbladder is unremarkable. No biliary ductal dilatation. SPLEEN: No acute abnormality. PANCREAS: No acute abnormality. ADRENAL GLANDS: No acute abnormality. KIDNEYS, URETERS AND BLADDER: No stones in the kidneys or ureters. No hydronephrosis. No perinephric or periureteral stranding. Urinary bladder is unremarkable. GI AND BOWEL: Appendix absent. Stomach demonstrates no acute abnormality. There is no bowel obstruction. PERITONEUM AND RETROPERITONEUM: No ascites. No free air. VASCULATURE: Aorta is normal in caliber. LYMPH NODES: No lymphadenopathy. REPRODUCTIVE ORGANS: 3.1 cm simple cyst noted within the right ovary, likely representing a follicular cyst in a patient of this age. According to the Ovarian-Adnexal Reporting and Data System Ultrasound (O-RADS US ), the finding is consistent with O-RADS US  2 (simple cyst 3-5 cm in a premenopausal patient)  and the  recommendation is no follow-up is required. Small exophytic fundal fibroid noted. BONES AND SOFT TISSUES: No acute osseous abnormality. No focal soft tissue abnormality. IMPRESSION: 1. Nodular infiltrate in the right lower lobe with associated airway inflammation, likely infectious or inflammatory in the acute setting. 2. 3.1 cm simple cyst in the right ovary, consistent with O-RADS US  2. No follow-up required. 3. Small exophytic fundal fibroid. Electronically signed by: Dorethia Molt MD 06/15/2024 12:25 AM EDT RP Workstation: [id number]   CT Angio Chest PE W and/or Wo Contrast Result Date: 06/15/2024 EXAM: CTA of the Chest with contrast for PE 06/15/2024 12:00:00 AM TECHNIQUE: CTA of the chest was performed after the administration of intravenous contrast. Multiplanar reformatted images are provided for review. MIP images are provided for review. Automated exposure control, iterative reconstruction, and/or weight based adjustment of the mA/kV was utilized to reduce the radiation dose to as low as reasonably achievable. COMPARISON: 12/19/2023 CLINICAL HISTORY: Pulmonary embolism (PE) suspected, high prob. FINDINGS: PULMONARY ARTERIES: Pulmonary arteries are adequately opacified for evaluation. No pulmonary embolism. Main pulmonary artery is normal in caliber. MEDIASTINUM: The heart and pericardium demonstrate no acute abnormality. There is no acute abnormality of the thoracic aorta. LYMPH NODES: No mediastinal, hilar or axillary lymphadenopathy. LUNGS AND PLEURA: Mild nodular pulmonary infiltrate is seen within the right lower lobe, possibly infectious or inflammatory in the acute setting. There is associated asymmetric mild bronchial wall thickening within the right lower lobe and left upper lobe in keeping with airway inflammation. Together, the findings may reflect early changes of multifocal bronchopneumonia. No pneumothorax or pleural effusion. No central obstructing lesion. UPPER ABDOMEN: Limited  images of the upper abdomen are unremarkable. SOFT TISSUES AND BONES: No acute bone or soft tissue abnormality. IMPRESSION: 1. No evidence of pulmonary embolism. 2. Mild nodular pulmonary infiltrate in the right lower lobe with associated airway inflammation, possibly representing early multifocal bronchopneumonia. Electronically signed by: Dorethia Molt MD 06/15/2024 12:20 AM EDT RP Workstation: [id number]   CT Head Wo Contrast Result Date: 06/15/2024 EXAM: CT HEAD WITHOUT CONTRAST 06/15/2024 12:00:00 AM TECHNIQUE: CT of the head was performed without the administration of intravenous contrast. Automated exposure control, iterative reconstruction, and/or weight based adjustment of the mA/kV was utilized to reduce the radiation dose to as low as reasonably achievable. COMPARISON: None available. CLINICAL HISTORY: Headache, neuro deficit. FINDINGS: BRAIN AND VENTRICLES: No acute hemorrhage. No evidence of acute infarct. No hydrocephalus. No extra-axial collection. No mass effect or midline shift. ORBITS: No acute abnormality. SINUSES: No acute abnormality. SOFT TISSUES AND SKULL: No acute soft tissue abnormality. No skull fracture. IMPRESSION: 1. No acute intracranial abnormality. Electronically signed by: Dorethia Molt MD 06/15/2024 12:14 AM EDT RP Workstation: [id number]   DG Chest 2 View Result Date: 06/14/2024 CLINICAL DATA:  Shortness of breath, chest pain, vomiting EXAM: CHEST - 2 VIEW COMPARISON:  Chest x-ray 12/19/2023, CT chest 12/19/2023 FINDINGS: The heart and mediastinal contours are within normal limits. No focal consolidation. No pulmonary edema. No pleural effusion. No pneumothorax. No acute osseous abnormality. IMPRESSION: No active cardiopulmonary disease. Electronically Signed   By: Morgane  Naveau M.D.   On: 06/14/2024 22:01     Labs:   Basic Metabolic Panel: Recent Labs  Lab 06/14/24 2124 06/15/24 0542  NA 140 143  K 3.2* 3.5  CL 109 113*  CO2 17* 19*  GLUCOSE 113* 139*  BUN  8 6  CREATININE 0.78 0.75  CALCIUM 9.5 8.8*   GFR Estimated Creatinine Clearance: 97.3 mL/min (by C-G formula based on  SCr of 0.75 mg/dL). Liver Function Tests: Recent Labs  Lab 06/14/24 2124 06/15/24 0542  AST 15 15  ALT 10 7  ALKPHOS 87 73  BILITOT 0.5 0.4  PROT 8.2* 6.9  ALBUMIN 4.6 4.1   Recent Labs  Lab 06/14/24 2124  LIPASE 29   No results for input(s): AMMONIA in the last 168 hours. Coagulation profile No results for input(s): INR, PROTIME in the last 168 hours.  CBC: Recent Labs  Lab 06/14/24 2124 06/15/24 0542  WBC 9.2 8.0  NEUTROABS  --  5.0  HGB 12.5 10.9*  HCT 38.3 34.6*  MCV 88.5 89.6  PLT 348 289   Cardiac Enzymes: No results for input(s): CKTOTAL, CKMB, CKMBINDEX, TROPONINI in the last 168 hours. BNP: Invalid input(s): POCBNP CBG: No results for input(s): GLUCAP in the last 168 hours. D-Dimer No results for input(s): DDIMER in the last 72 hours. Hgb A1c No results for input(s): HGBA1C in the last 72 hours. Lipid Profile No results for input(s): CHOL, HDL, LDLCALC, TRIG, CHOLHDL, LDLDIRECT in the last 72 hours. Thyroid  function studies No results for input(s): TSH, T4TOTAL, T3FREE, THYROIDAB in the last 72 hours.  Invalid input(s): FREET3 Anemia work up No results for input(s): VITAMINB12, FOLATE, FERRITIN, TIBC, IRON, RETICCTPCT in the last 72 hours. Microbiology Recent Results (from the past 240 hours)  Resp panel by RT-PCR (RSV, Flu A&B, Covid) Anterior Nasal Swab     Status: None   Collection Time: 06/14/24 10:38 PM   Specimen: Anterior Nasal Swab  Result Value Ref Range Status   SARS Coronavirus 2 by RT PCR NEGATIVE NEGATIVE Final    Comment: (NOTE) SARS-CoV-2 target nucleic acids are NOT DETECTED.  The SARS-CoV-2 RNA is generally detectable in upper respiratory specimens during the acute phase of infection. The lowest concentration of SARS-CoV-2 viral copies this assay can  detect is 138 copies/mL. A negative result does not preclude SARS-Cov-2 infection and should not be used as the sole basis for treatment or other patient management decisions. A negative result may occur with  improper specimen collection/handling, submission of specimen other than nasopharyngeal swab, presence of viral mutation(s) within the areas targeted by this assay, and inadequate number of viral copies(<138 copies/mL). A negative result must be combined with clinical observations, patient history, and epidemiological information. The expected result is Negative.  Fact Sheet for Patients:  BloggerCourse.com  Fact Sheet for Healthcare Providers:  SeriousBroker.it  This test is no t yet approved or cleared by the [place]  FDA and  has been authorized for detection and/or diagnosis of SARS-CoV-2 by FDA under an Emergency Use Authorization (EUA). This EUA will remain  in effect (meaning this test can be used) for the duration of the COVID-19 declaration under Section 564(b)(1) of the Act, 21 U.S.C.section 360bbb-3(b)(1), unless the authorization is terminated  or revoked sooner.       Influenza A by PCR NEGATIVE NEGATIVE Final   Influenza B by PCR NEGATIVE NEGATIVE Final    Comment: (NOTE) The Xpert Xpress SARS-CoV-2/FLU/RSV plus assay is intended as an aid in the diagnosis of influenza from Nasopharyngeal swab specimens and should not be used as a sole basis for treatment. Nasal washings and aspirates are unacceptable for Xpert Xpress SARS-CoV-2/FLU/RSV testing.  Fact Sheet for Patients: BloggerCourse.com  Fact Sheet for Healthcare Providers: SeriousBroker.it  This test is not yet approved or cleared by the [place]  FDA and has been authorized for detection and/or diagnosis of SARS-CoV-2 by FDA under an Emergency Use Authorization (EUA).  This EUA will remain in  effect (meaning this test can be used) for the duration of the COVID-19 declaration under Section 564(b)(1) of the Act, 21 U.S.C. section 360bbb-3(b)(1), unless the authorization is terminated or revoked.     Resp Syncytial Virus by PCR NEGATIVE NEGATIVE Final    Comment: (NOTE) Fact Sheet for Patients: BloggerCourse.com  Fact Sheet for Healthcare Providers: SeriousBroker.it  This test is not yet approved or cleared by the [place]  FDA and has been authorized for detection and/or diagnosis of SARS-CoV-2 by FDA under an Emergency Use Authorization (EUA). This EUA will remain in effect (meaning this test can be used) for the duration of the COVID-19 declaration under Section 564(b)(1) of the Act, 21 U.S.C. section 360bbb-3(b)(1), unless the authorization is terminated or revoked.  Performed at [facility], [address] [place], [place] [address]   Blood culture (routine x 2)     Status: Abnormal   Collection Time: 06/15/24  2:44 AM   Specimen: BLOOD  Result Value Ref Range Status   Specimen Description   Final    BLOOD LEFT ANTECUBITAL Performed at [facility], [address] [place], [place] [address]    Special Requests   Final    BOTTLES DRAWN AEROBIC AND ANAEROBIC Blood Culture results may not be optimal due to an inadequate volume of blood received in culture bottles Performed at [facility], [address], [place], [place] [address]    Culture  Setup Time   Final    GRAM POSITIVE COCCI IN BOTH AEROBIC AND ANAEROBIC BOTTLES CRITICAL RESULT CALLED TO, READ BACK BY AND VERIFIED WITH: PHARMD ELLEN J [id number] FCP    Culture (A)  Final    COAGULASE NEGATIVE STAPHYLOCOCCUS THE SIGNIFICANCE OF ISOLATING THIS ORGANISM FROM A SINGLE SET OF BLOOD CULTURES WHEN MULTIPLE SETS ARE DRAWN IS UNCERTAIN. PLEASE NOTIFY THE MICROBIOLOGY DEPARTMENT WITHIN ONE WEEK IF  SPECIATION AND SENSITIVITIES ARE REQUIRED. Performed at [facility], [address] [place], [place] [address]    Report Status 06/17/2024 FINAL  Final  Blood Culture ID Panel (Reflexed)     Status: Abnormal   Collection Time: 06/15/24  2:44 AM  Result Value Ref Range Status   Enterococcus faecalis NOT DETECTED NOT DETECTED Final   Enterococcus Faecium NOT DETECTED NOT DETECTED Final   Listeria monocytogenes NOT DETECTED NOT DETECTED Final   Staphylococcus species DETECTED (A) NOT DETECTED Final    Comment: CRITICAL RESULT CALLED TO, READ BACK BY AND VERIFIED WITH: PHARMD ELLEN J [id number] FCP    Staphylococcus aureus (BCID) NOT DETECTED NOT DETECTED Final   Staphylococcus epidermidis NOT DETECTED NOT DETECTED Final   Staphylococcus lugdunensis NOT DETECTED NOT DETECTED Final   Streptococcus species NOT DETECTED NOT DETECTED Final   Streptococcus agalactiae NOT DETECTED NOT DETECTED Final   Streptococcus pneumoniae NOT DETECTED NOT DETECTED Final   Streptococcus pyogenes NOT DETECTED NOT DETECTED Final   A.calcoaceticus-baumannii NOT DETECTED NOT DETECTED Final   Bacteroides fragilis NOT DETECTED NOT DETECTED Final   Enterobacterales NOT DETECTED NOT DETECTED Final   Enterobacter cloacae complex NOT DETECTED NOT DETECTED Final   Escherichia coli NOT DETECTED NOT DETECTED Final   Klebsiella aerogenes NOT DETECTED NOT DETECTED Final   Klebsiella oxytoca NOT DETECTED NOT DETECTED Final   Klebsiella pneumoniae NOT DETECTED NOT DETECTED Final   Proteus species NOT DETECTED NOT DETECTED Final   Salmonella species NOT DETECTED NOT DETECTED Final   Serratia marcescens  NOT DETECTED NOT DETECTED Final   Haemophilus influenzae NOT DETECTED NOT DETECTED Final   Neisseria meningitidis NOT DETECTED NOT DETECTED Final   Pseudomonas aeruginosa NOT DETECTED NOT DETECTED Final   Stenotrophomonas maltophilia NOT DETECTED NOT DETECTED Final   Candida albicans NOT DETECTED NOT DETECTED Final    Candida auris NOT DETECTED NOT DETECTED Final   Candida glabrata NOT DETECTED NOT DETECTED Final   Candida krusei NOT DETECTED NOT DETECTED Final   Candida parapsilosis NOT DETECTED NOT DETECTED Final   Candida tropicalis NOT DETECTED NOT DETECTED Final   Cryptococcus neoformans/gattii NOT DETECTED NOT DETECTED Final    Comment: Performed at [facility], [address] [place], [place] [address]  Blood culture (routine x 2)     Status: None (Preliminary result)   Collection Time: 06/15/24  4:26 AM   Specimen: BLOOD RIGHT ARM  Result Value Ref Range Status   Specimen Description   Final    BLOOD RIGHT ARM Performed at [facility], [address], [place], [place] [address]    Special Requests   Final    BOTTLES DRAWN AEROBIC AND ANAEROBIC Blood Culture results may not be optimal due to an inadequate volume of blood received in culture bottles Performed at [facility], [address], [place], [place] [address]    Culture   Final    NO GROWTH 2 DAYS Performed at [facility], [address], [place], [place] [address]    Report Status PENDING  Incomplete     Discharge Instructions:   Discharge Instructions     Call MD for:  persistant nausea and vomiting   Complete by: As directed    Call MD for:  redness, tenderness, or signs of infection (pain, swelling, redness, odor or green/yellow discharge around incision site)   Complete by: As directed    Call MD for:  severe uncontrolled pain   Complete by: As directed    Diet - low sodium heart healthy   Complete by: As directed    Discharge instructions   Complete by: As directed    Follow up with your primary care provider in one week. Check blood work at that time. Seek medical attention for worsening symptoms. Follow up with your neurology for headache.   Increase activity slowly   Complete by: As directed       Allergies as of 06/17/2024   No Known Allergies      Medication  List     TAKE these medications    acetaZOLAMIDE  ER 500 MG capsule Commonly known as: DIAMOX  Take 1 capsule (500 mg total) by mouth 2 (two) times daily.   albuterol  108 (90 Base) MCG/ACT inhaler Commonly known as: VENTOLIN  HFA Inhale 1-2 puffs into the lungs every 6 (six) hours as needed for wheezing or shortness of breath.   amLODipine  5 MG tablet Commonly known as: NORVASC  Take 5 mg by mouth daily.   azaTHIOprine  50 MG tablet Commonly known as: IMURAN  Take 50 mg by mouth daily.   butalbital -acetaminophen -caffeine  50-325-40 MG tablet Commonly known as: FIORICET  Take 1 tablet by mouth every 6 (six) hours as needed for headache or migraine. What changed:  See the new instructions. Another medication with the same name was removed. Continue taking this medication, and follow the directions you see here.   cyanocobalamin  1000 MCG tablet Take 1 tablet (1,000 mcg total) by mouth daily. Start taking on: June 18, 2024   DULoxetine  60 MG  capsule Commonly known as: CYMBALTA  Take 60 mg by mouth every evening.   famotidine  20 MG tablet Commonly known as: PEPCID  Take 20 mg by mouth daily.   hydroxychloroquine  200 MG tablet Commonly known as: PLAQUENIL  Take 400 mg by mouth daily.   ondansetron  4 MG disintegrating tablet Commonly known as: ZOFRAN -ODT Take 1 tablet (4 mg total) by mouth every 8 (eight) hours as needed for nausea or vomiting.   pregabalin  150 MG capsule Commonly known as: LYRICA  Take 150 mg by mouth in the morning, at noon, and at bedtime.   sulindac  200 MG tablet Commonly known as: CLINORIL  Take 1 tablet (200 mg total) by mouth 2 (two) times daily.   tirzepatide  2.5 MG/0.5ML injection vial Commonly known as: ZEPBOUND  Inject 2.5 mg into the skin once a week.   tiZANidine  4 MG tablet Commonly known as: ZANAFLEX  Take 1 tablet (4 mg total) by mouth 3 (three) times daily.   triamcinolone  0.025 % ointment Commonly known as: KENALOG  Apply 1  Application topically 2 (two) times daily.        Follow-up Information     Bernadine Manos, MD Follow up in 1 week(s).   Specialty: Internal Medicine Contact information: [address] [place] [address] [phone]                  Time coordinating discharge: 39 minutes  Signed:  Chaseton Yepiz  [facility] 06/17/2024, 4:09 PM

## 2024-06-17 NOTE — TOC Transition Note (Signed)
 Transition of Care [facility]) - Discharge Note   Patient Details  Name: Diana Huynh MRN: [id number] Date of Birth: Feb 20, 1981  Transition of [facility]) CM/SW Contact:  Bascom Service, RN Phone Number: 06/17/2024, 10:59 AM   Clinical Narrative: d/c home no CM needs.      Final next level of care: Home/Self Care Barriers to Discharge: No Barriers Identified   Patient Goals and CMS Choice Patient states their goals for this hospitalization and ongoing recovery are:: Home CMS Medicare.gov Compare Post Acute Care list provided to:: Patient Choice offered to / list presented to : Patient [place] ownership interest in [facility].provided to:: Patient    Discharge Placement                       Discharge Plan and Services Additional resources added to the After Visit Summary for                                       Social Drivers of Health (SDOH) Interventions SDOH Screenings   Food Insecurity: Food Insecurity Present (06/15/2024)  Housing: Low Risk  (06/15/2024)  Transportation Needs: No Transportation Needs (06/15/2024)  Utilities: Not At Risk (06/15/2024)  Depression (PHQ2-9): Medium Risk (03/02/2024)  Financial Resource Strain: Medium Risk (04/10/2024)  Physical Activity: Inactive (04/10/2024)  Social Connections: Socially Isolated (04/10/2024)  Stress: Stress Concern Present (04/10/2024)  Tobacco Use: High Risk (06/15/2024)     Readmission Risk Interventions     No data to display

## 2024-06-18 ENCOUNTER — Telehealth: Payer: Self-pay

## 2024-06-18 NOTE — Transitions of Care (Post Inpatient/ED Visit) (Signed)
 06/18/2024  Name: Diana Huynh MRN: [id number] DOB: [birth year]  Today's TOC FU Call Status: Today's TOC FU Call Status:: Successful TOC FU Call Completed TOC FU Call Complete Date: 06/18/24 Patient's Name and Date of Birth confirmed.  Transition Care Management Follow-up Telephone Call Date of Discharge: 06/17/24 Discharge Facility: [facility]) Type of Discharge: Inpatient Admission Primary Inpatient Discharge Diagnosis:: N/V How have you been since you were released from the hospital?: Better Any questions or concerns?: No  Items Reviewed: Did you receive and understand the discharge instructions provided?: Yes Medications obtained,verified, and reconciled?: Yes (Medications Reviewed) Any new allergies since your discharge?: No Dietary orders reviewed?: Yes Do you have support at home?: Yes People in Home [RPT]: spouse  Medications Reviewed Today: Medications Reviewed Today     Reviewed by Emmitt Pan, LPN (Licensed Practical Nurse) on 06/18/24 at [phone]  Med List Status: <None>   Medication Order Taking? Sig Documenting Provider Last Dose Status Informant  acetaZOLAMIDE  ER (DIAMOX ) 500 MG capsule [id number] Yes Take 1 capsule (500 mg total) by mouth 2 (two) times daily. Sater, Charlie LABOR, MD  Active Self  albuterol  (VENTOLIN  HFA) 108 (90 Base) MCG/ACT inhaler [id number] Yes Inhale 1-2 puffs into the lungs every 6 (six) hours as needed for wheezing or shortness of breath. Babara Greig GAILS, PA-C  Active Self  amLODipine  (NORVASC ) 5 MG tablet [id number] Yes Take 5 mg by mouth daily. [provider]  Active Self  azaTHIOprine  (IMURAN ) 50 MG tablet [id number] Yes Take 50 mg by mouth daily. [provider]  Active Self  bupivacaine  (MARCAINE ) 0.5 % (with pres) injection 4 mL [id number]   Sater, Charlie LABOR, MD  Active   butalbital -acetaminophen -caffeine  (FIORICET ) 50-325-40 MG tablet [id number] Yes Take 1 tablet by mouth every 6 (six) hours as needed for headache or  migraine. Sonjia Held, MD  Active   cyanocobalamin  1000 MCG tablet [id number] Yes Take 1 tablet (1,000 mcg total) by mouth daily. Pokhrel, Laxman, MD  Active   DULoxetine  (CYMBALTA ) 60 MG capsule [id number] Yes Take 60 mg by mouth every evening. [provider]  Active Self  famotidine  (PEPCID ) 20 MG tablet [id number] Yes Take 20 mg by mouth daily.  [provider]  Active Self  hydroxychloroquine  (PLAQUENIL ) 200 MG tablet [id number] Yes Take 400 mg by mouth daily. [provider]  Active Self           Med Note ALLEGRA, DEATRICE I   Sun Jun 14, 2024 10:51 PM)    ondansetron  (ZOFRAN -ODT) 4 MG disintegrating tablet [id number] Yes Take 1 tablet (4 mg total) by mouth every 8 (eight) hours as needed for nausea or vomiting. Pokhrel, Laxman, MD  Active   pregabalin  (LYRICA ) 150 MG capsule [id number] Yes Take 150 mg by mouth in the morning, at noon, and at bedtime. [provider]  Active Self           Med Note (SATTERFIELD, TEENA BRAVO   Wed Jan 16, 2023 10:23 PM)    sulindac  (CLINORIL ) 200 MG tablet [id number] Yes Take 1 tablet (200 mg total) by mouth 2 (two) times daily. Sater, Charlie LABOR, MD  Active Self  tirzepatide  (ZEPBOUND ) 2.5 MG/0.5ML injection vial [id number] Yes Inject 2.5 mg into the skin once a week. Elnora Ip, MD  Active Self           Med Note ALLEGRA, [facility] I   Sun Jun 14, 2024 10:52 PM) Hasn't started  tiZANidine  (ZANAFLEX ) 4 MG tablet [id number] Yes Take 1 tablet (  4 mg total) by mouth 3 (three) times daily. Sater, Charlie LABOR, MD  Active Self  triamcinolone  (KENALOG ) 0.025 % ointment [id number]  Apply 1 Application topically 2 (two) times daily.  Patient not taking: Reported on 06/18/2024   Lemon Raisin, MD  Active Self            Home Care and Equipment/Supplies: Were Home Health Services Ordered?: NA Any new equipment or medical supplies ordered?: NA  Functional Questionnaire: Do you need assistance with bathing/showering  or dressing?: No Do you need assistance with meal preparation?: No Do you need assistance with eating?: No Do you have difficulty maintaining continence: No Do you need assistance with getting out of bed/getting out of a chair/moving?: No Do you have difficulty managing or taking your medications?: No  Follow up appointments reviewed: PCP Follow-up appointment confirmed?: No (sent message to staff to schedule) MD Provider Line Number:[phone] Given: No Specialist Hospital Follow-up appointment confirmed?: NA Do you need transportation to your follow-up appointment?: No Do you understand care options if your condition(s) worsen?: Yes-patient verbalized understanding    SIGNATURE Julian Lemmings, LPN [facility] Nurse Health Advisor Direct Dial [phone]

## 2024-06-20 LAB — CULTURE, BLOOD (ROUTINE X 2): Culture: NO GROWTH

## 2024-06-22 DIAGNOSIS — M542 Cervicalgia: Secondary | ICD-10-CM | POA: Diagnosis not present

## 2024-06-22 DIAGNOSIS — G894 Chronic pain syndrome: Secondary | ICD-10-CM | POA: Diagnosis not present

## 2024-06-22 DIAGNOSIS — M328 Other forms of systemic lupus erythematosus: Secondary | ICD-10-CM | POA: Diagnosis not present

## 2024-06-22 DIAGNOSIS — M7918 Myalgia, other site: Secondary | ICD-10-CM | POA: Diagnosis not present

## 2024-06-22 DIAGNOSIS — M4802 Spinal stenosis, cervical region: Secondary | ICD-10-CM | POA: Diagnosis not present

## 2024-06-23 ENCOUNTER — Ambulatory Visit: Payer: Self-pay

## 2024-06-23 DIAGNOSIS — M3219 Other organ or system involvement in systemic lupus erythematosus: Secondary | ICD-10-CM | POA: Diagnosis not present

## 2024-06-23 NOTE — Telephone Encounter (Signed)
 FYI Only or Action Required?: Action required by provider: request for appointment.  Patient was last seen in primary care on 06/12/2024 by Elnora Ip, MD.  Called Nurse Triage reporting No chief complaint on file..   Interventions attempted: SABRA  Symptoms are: unchanged.  Triage Disposition: No disposition on file.  Patient/caregiver understands and will follow disposition?:    Copied from CRM [id number]. Topic: Clinical - Red Word Triage >> Jun 23, 2024  1:08 PM Alfonso ORN wrote: Red Word that prompted transfer to Nurse Triage: chronic pain from fibromyalgia rate pain 8  , fatique Pt. Originally called needing a hospital followup appt. Was discharged 06/17/24   ----------------------------------------------------------------------- From previous Reason for Contact - Scheduling: Patient/patient representative is calling to schedule an appointment. Refer to attachments for appointment information. Reason for Disposition . Requesting regular office appointment  Protocols used: Information Only Call - No Triage-A-AH

## 2024-06-23 NOTE — Telephone Encounter (Addendum)
 Called pt to discuss her pain and if a sooner appt than 10/3 is needed - no answer, left message to call the office.

## 2024-06-24 NOTE — Telephone Encounter (Signed)
 Pt returned call and  appt was moved  the appt to 9/25

## 2024-06-25 ENCOUNTER — Ambulatory Visit: Admitting: Student

## 2024-06-25 ENCOUNTER — Other Ambulatory Visit (HOSPITAL_COMMUNITY): Payer: Self-pay

## 2024-06-25 ENCOUNTER — Encounter: Payer: Self-pay | Admitting: Student

## 2024-06-25 ENCOUNTER — Telehealth: Payer: Self-pay

## 2024-06-25 VITALS — BP 129/75 | HR 69 | Temp 97.9°F | Ht 61.0 in | Wt 217.4 lb

## 2024-06-25 DIAGNOSIS — E538 Deficiency of other specified B group vitamins: Secondary | ICD-10-CM

## 2024-06-25 DIAGNOSIS — E876 Hypokalemia: Secondary | ICD-10-CM | POA: Diagnosis not present

## 2024-06-25 DIAGNOSIS — Z6841 Body Mass Index (BMI) 40.0 and over, adult: Secondary | ICD-10-CM | POA: Diagnosis not present

## 2024-06-25 DIAGNOSIS — R112 Nausea with vomiting, unspecified: Secondary | ICD-10-CM | POA: Diagnosis not present

## 2024-06-25 DIAGNOSIS — Z2821 Immunization not carried out because of patient refusal: Secondary | ICD-10-CM

## 2024-06-25 MED ORDER — SEMAGLUTIDE-WEIGHT MANAGEMENT 1.7 MG/0.75ML ~~LOC~~ SOAJ
1.7000 mg | SUBCUTANEOUS | 0 refills | Status: AC
Start: 2024-09-20 — End: 2024-10-18

## 2024-06-25 MED ORDER — SEMAGLUTIDE-WEIGHT MANAGEMENT 0.5 MG/0.5ML ~~LOC~~ SOAJ: 0.5000 mg | SUBCUTANEOUS | 0 refills | Status: AC

## 2024-06-25 MED ORDER — SEMAGLUTIDE-WEIGHT MANAGEMENT 0.25 MG/0.5ML ~~LOC~~ SOAJ: 0.2500 mg | SUBCUTANEOUS | 0 refills | Status: AC

## 2024-06-25 MED ORDER — SEMAGLUTIDE-WEIGHT MANAGEMENT 1 MG/0.5ML ~~LOC~~ SOAJ
1.0000 mg | SUBCUTANEOUS | 0 refills | Status: AC
Start: 2024-08-22 — End: 2024-09-19

## 2024-06-25 MED ORDER — SEMAGLUTIDE-WEIGHT MANAGEMENT 2.4 MG/0.75ML ~~LOC~~ SOAJ: 2.4000 mg | SUBCUTANEOUS | 0 refills | Status: AC

## 2024-06-25 NOTE — Assessment & Plan Note (Signed)
 Recent hospitalization from 9/14 to 9/17 for nausea and vomiting.  CT abdomen pelvis without acute findings.  Suspect symptoms secondary to viral gastroenteritis.  Normal LFTs.  Provided IV fluids.  Also treated what was initially concern for bronchopneumonia with ceftriaxone  and azithromycin , antibiotics discontinued at discharge.  Patient at today's visit feeling well and symptoms resolved.

## 2024-06-25 NOTE — Assessment & Plan Note (Signed)
 At last OV, plans to start Zepbound  for obesity.  Insurance denied and recommended Wegovy .  Patient denies nausea or vomiting and tolerating p.o. intake.  Will send Rx for Wegovy  to pharmacy.  Patient made aware there is possibility that insurance will no longer cover this but will try today.

## 2024-06-25 NOTE — Patient Instructions (Addendum)
 Thank you, Diana Huynh for allowing us  to provide your care today. Today we discussed:  -Glad you are feeling better today! -Continue taking B12 daily  -Will start Wegovy  for weight loss. The starting dose is 0.25 mg once a week for 4 weeks. (Of note, insurance may no longer cover this medication) -Blood work today   I have ordered the following medication/changed the following medications:  Start the following medications: Meds ordered this encounter  Medications   semaglutide -weight management (WEGOVY ) 0.25 MG/0.5ML SOAJ SQ injection    Sig: Inject 0.25 mg into the skin once a week for 28 days.    Dispense:  2 mL    Refill:  0   semaglutide -weight management (WEGOVY ) 0.5 MG/0.5ML SOAJ SQ injection    Sig: Inject 0.5 mg into the skin once a week for 28 days.    Dispense:  2 mL    Refill:  0   semaglutide -weight management (WEGOVY ) 1 MG/0.5ML SOAJ SQ injection    Sig: Inject 1 mg into the skin once a week for 28 days.    Dispense:  2 mL    Refill:  0   semaglutide -weight management (WEGOVY ) 1.7 MG/0.75ML SOAJ SQ injection    Sig: Inject 1.7 mg into the skin once a week for 28 days.    Dispense:  3 mL    Refill:  0   semaglutide -weight management (WEGOVY ) 2.4 MG/0.75ML SOAJ SQ injection    Sig: Inject 2.4 mg into the skin once a week for 28 days.    Dispense:  3 mL    Refill:  0     Follow up: 3 months    Should you have any questions or concerns please call the internal medicine clinic at [phone].    Gerrit Rafalski, D.O. [facility]

## 2024-06-25 NOTE — Assessment & Plan Note (Signed)
 Noted B12 of 102 earlier this month. Started B12 1000 mcg daily supplementation.  Consider recheck level at future follow-up.

## 2024-06-25 NOTE — Progress Notes (Signed)
 CC: Hospital follow-up  HPI: [name] is a 43 y.o. female living with a history stated below and presents today for hospital follow-up. Please see problem based assessment and plan for additional details.  Follow up Hospitalization  Patient was admitted to [facility] long hospital on 9/14 and discharged on 9/17. She was treated for intractable nausea and vomiting. Treatment for this included antibiotics and IV fluids. Telephone follow up was done on 9/18 She reports good compliance with treatment. She reports this condition is resolved.  ----------------------------------------------------------------------------------------- -   Past Medical History:  Diagnosis Date   Bronchitis    Fibromyalgia    Lupus    Pericarditis    per pt report   Pleuritis 12/31/2017    Current Outpatient Medications on File Prior to Visit  Medication Sig Dispense Refill   acetaZOLAMIDE  ER (DIAMOX ) 500 MG capsule Take 1 capsule (500 mg total) by mouth 2 (two) times daily. 180 capsule 3   albuterol  (VENTOLIN  HFA) 108 (90 Base) MCG/ACT inhaler Inhale 1-2 puffs into the lungs every 6 (six) hours as needed for wheezing or shortness of breath. 8 g 0   amLODipine  (NORVASC ) 5 MG tablet Take 5 mg by mouth daily.     azaTHIOprine  (IMURAN ) 50 MG tablet Take 50 mg by mouth daily.     butalbital -acetaminophen -caffeine  (FIORICET ) 50-325-40 MG tablet Take 1 tablet by mouth every 6 (six) hours as needed for headache or migraine. 30 tablet 0   cyanocobalamin  1000 MCG tablet Take 1 tablet (1,000 mcg total) by mouth daily. 90 tablet 0   DULoxetine  (CYMBALTA ) 60 MG capsule Take 60 mg by mouth every evening.     famotidine  (PEPCID ) 20 MG tablet Take 20 mg by mouth daily.      hydroxychloroquine  (PLAQUENIL ) 200 MG tablet Take 400 mg by mouth daily.     ondansetron  (ZOFRAN -ODT) 4 MG disintegrating tablet Take 1 tablet (4 mg total) by mouth every 8 (eight) hours as needed for nausea or vomiting. 20 tablet 0    pregabalin  (LYRICA ) 150 MG capsule Take 150 mg by mouth in the morning, at noon, and at bedtime.     sulindac  (CLINORIL ) 200 MG tablet Take 1 tablet (200 mg total) by mouth 2 (two) times daily. 60 tablet 5   tiZANidine  (ZANAFLEX ) 4 MG tablet Take 1 tablet (4 mg total) by mouth 3 (three) times daily. 90 tablet 5   triamcinolone  (KENALOG ) 0.025 % ointment Apply 1 Application topically 2 (two) times daily. (Patient not taking: Reported on 06/18/2024) 30 g 0   Current Facility-Administered Medications on File Prior to Visit  Medication Dose Route Frequency Provider Last Rate Last Admin   bupivacaine  (MARCAINE ) 0.5 % (with pres) injection 4 mL  4 mL Infiltration Once Sater, Charlie LABOR, MD        Family History  Problem Relation Age of Onset   Hypertension Mother    Heart murmur Mother    Healthy Father    Thyroid  disease Sister    Heart failure Sister        has ICD   Cervical cancer Sister    Breast cancer Paternal Aunt    Breast cancer Paternal Grandmother    Neuropathy Neg Hx     Social History   Socioeconomic History   Marital status: Married    Spouse name: Not on file   Number of children: Not on file   Years of education: Not on file   Highest education level: Associate degree: academic program  Occupational  History   Not on file  Tobacco Use   Smoking status: Every Day    Current packs/day: 0.00    Types: Cigarettes    Last attempt to quit: 09/20/2018    Years since quitting: 5.7   Smokeless tobacco: Never   Tobacco comments:    4-5 cigs/day  Vaping Use   Vaping status: Former  Substance and Sexual Activity   Alcohol use: Not Currently   Drug use: No   Sexual activity: Yes  Other Topics Concern   Not on file  Social History Narrative   Caffiene coffee 2-4 cups daily.    Working:  none   Husband  with 2 grown kids.    Social Drivers of Health   Financial Resource Strain: Medium Risk (04/10/2024)   Overall Financial Resource Strain (CARDIA)    Difficulty of  Paying Living Expenses: Somewhat hard  Food Insecurity: Food Insecurity Present (06/15/2024)   Hunger Vital Sign    Worried About Running Out of Food in the Last Year: Sometimes true    Ran Out of Food in the Last Year: Sometimes true  Transportation Needs: No Transportation Needs (06/15/2024)   PRAPARE - Administrator, Civil Service (Medical): No    Lack of Transportation (Non-Medical): No  Physical Activity: Inactive (04/10/2024)   Exercise Vital Sign    Days of Exercise per Week: 0 days    Minutes of Exercise per Session: Not on file  Stress: Stress Concern Present (04/10/2024)   [employer] of Occupational Health - Occupational Stress Questionnaire    Feeling of Stress: Very much  Social Connections: Socially Isolated (04/10/2024)   Social Connection and Isolation Panel    Frequency of Communication with Friends and Family: Once a week    Frequency of Social Gatherings with Friends and Family: Never    Attends Religious Services: Never    Database administrator or Organizations: No    Attends Engineer, structural: Not on file    Marital Status: Married  Catering manager Violence: Not At Risk (06/15/2024)   Humiliation, Afraid, Rape, and Kick questionnaire    Fear of Current or Ex-Partner: No    Emotionally Abused: No    Physically Abused: No    Sexually Abused: No    Review of Systems: ROS negative except for what is noted on the assessment and plan.  Vitals:   06/25/24 0921  BP: 129/75  Pulse: 69  Temp: 97.9 F (36.6 C)  TempSrc: Oral  SpO2: 100%  Weight: 217 lb 6.4 oz (98.6 kg)  Height: 5' 1 (1.549 m)   Physical Exam: Constitutional: Alert, sitting up in chair comfortably, in no acute distress Cardiovascular: regular rate and rhythm Pulmonary/Chest: normal work of breathing on room air, lungs clear to auscultation bilaterally, no wheezing or crackles Neurological: alert & oriented x 3 Skin: warm and dry  Assessment & Plan:    Assessment & Plan Nausea and vomiting, unspecified vomiting type Recent hospitalization from 9/14 to 9/17 for nausea and vomiting.  CT abdomen pelvis without acute findings.  Suspect symptoms secondary to viral gastroenteritis.  Normal LFTs.  Provided IV fluids.  Also treated what was initially concern for bronchopneumonia with ceftriaxone  and azithromycin , antibiotics discontinued at discharge.  Patient at today's visit feeling well and symptoms resolved. Hypokalemia Noted hypokalemia in hospital.  K at discharge was 3.5.  Will repeat BMP today. Vitamin B12 deficiency Noted B12 of 102 earlier this month. Started B12 1000 mcg daily supplementation.  Consider recheck level at future follow-up. Obesity, morbid, BMI 40.0-49.9 (HCC) At last OV, plans to start Zepbound  for obesity.  Insurance denied and recommended Wegovy .  Patient denies nausea or vomiting and tolerating p.o. intake.  Will send Rx for Wegovy  to pharmacy.  Patient made aware there is possibility that insurance will no longer cover this but will try today. Immunization declined Declined flu and PCV vaccination today.  Return in about 3 months (around 09/24/2024) for routine check up.   Patient discussed with Dr. Karna Ozell Nearing, D.O. [facility] Internal Medicine, PGY-3 Phone: [phone] Date 06/25/2024 Time 4:25 PM

## 2024-06-25 NOTE — Telephone Encounter (Signed)
 Pharmacy Patient Advocate Encounter  Effective October 1st, [place] will discontinue coverage of GLP1 medications for weight loss (such as Wegovy and Zepbound ), unless the patient has a documented history of a heart attack or stroke. Zepbound  will continue to be covered only for patients with moderate to severe sleep apnea (AHI 15-30). Because of this change, the prior authorization team will not be submitting new PA requests for GLP1 medications prescribed for weight loss between now and October 1st, as patients will be unable to continue therapy under Medicaid coverage.

## 2024-06-26 ENCOUNTER — Ambulatory Visit: Payer: Self-pay | Admitting: Student

## 2024-06-26 LAB — BASIC METABOLIC PANEL WITH GFR
BUN/Creatinine Ratio: 15 (ref 9–23)
BUN: 14 mg/dL (ref 6–24)
CO2: 16 mmol/L — ABNORMAL LOW (ref 20–29)
Calcium: 9 mg/dL (ref 8.7–10.2)
Chloride: 107 mmol/L — ABNORMAL HIGH (ref 96–106)
Creatinine, Ser: 0.91 mg/dL (ref 0.57–1.00)
Glucose: 82 mg/dL (ref 70–99)
Potassium: 4.1 mmol/L (ref 3.5–5.2)
Sodium: 137 mmol/L (ref 134–144)
eGFR: 81 mL/min/1.73 (ref >59)

## 2024-06-26 NOTE — Progress Notes (Signed)
 Internal Medicine Clinic Attending  Case discussed with the resident at the time of the visit.  We reviewed the resident's history and exam and pertinent patient test results.  I agree with the assessment, diagnosis, and plan of care documented in the resident's note.

## 2024-07-03 ENCOUNTER — Inpatient Hospital Stay: Payer: Self-pay

## 2024-07-03 NOTE — Telephone Encounter (Signed)
 Patient's identity confirmed x 2 identifiers  Retrieved a different pharmacy for patient [facility] [address]  P: [phone]  Verified medication is in stock. Pt aware.

## 2024-07-03 NOTE — Telephone Encounter (Signed)
 Patient is requesting refill on their .azaTHIOprine  (IMURAN ) 75 mg tablet   (Pharmacy is currently out of stock wont have enough till Oct. 13th when they will have it back in stock) Patient is completely out of medication  Pharmacy Name  [facility] on [facility]   Please advise. Patient can be reached at  [phone]

## 2024-07-06 ENCOUNTER — Encounter: Payer: Self-pay | Admitting: Obstetrics and Gynecology

## 2024-07-06 ENCOUNTER — Ambulatory Visit: Admitting: Obstetrics and Gynecology

## 2024-07-06 VITALS — BP 120/64 | HR 88 | Ht 61.0 in | Wt 216.1 lb

## 2024-07-06 DIAGNOSIS — N83201 Unspecified ovarian cyst, right side: Secondary | ICD-10-CM | POA: Diagnosis not present

## 2024-07-06 NOTE — Progress Notes (Signed)
 OCC NAUSEA/VOMITIng No dyspareunia No dysuria Normal bowel habits  CC: ultrasound follow up Subjective:    Patient ID: Diana Huynh, female    DOB: 13-Oct-1980, 43 y.o.   MRN: [id number]  HPI 43 yo patient seen for ultrasound follow up.  She has had several CT scans and ultrasounds or the last few months.  Explained to patient and significant other that these cysts seem to be transient.  Normally OCPs would be offered for cyst prevention, but due to lupus and other comorbidities some caution is warranted.  Pt notes occ nausea and vomiting, no dysuria, no dyspareunia and normal bowel habits.  Explained that a 3 cm simple cyst usually does not warrant surgery.   Review of Systems     Objective:   Physical Exam Vitals:   07/06/24 0951  BP: 120/64  Pulse: 88  CT ABDOMEN AND PELVIS WITH CONTRAST 06/15/2024 12:00:00 AM   TECHNIQUE: CT of the abdomen and pelvis was performed with the administration of intravenous contrast (100mL iohexol  (OMNIPAQUE ) 350 MG/ML injection). Multiplanar reformatted images are provided for review. Automated exposure control, iterative reconstruction, and/or weight-based adjustment of the mA/kV was utilized to reduce the radiation dose to as low as reasonably achievable.   COMPARISON: None available.   CLINICAL HISTORY: Abdominal pain, acute, nonlocalized.   FINDINGS:   LOWER CHEST: Nodular infiltrate noted within the right lower lobe with associated airway inflammation, likely infectious or inflammatory in the acute setting.   LIVER: The liver is unremarkable.   GALLBLADDER AND BILE DUCTS: Gallbladder is unremarkable. No biliary ductal dilatation.   SPLEEN: No acute abnormality.   PANCREAS: No acute abnormality.   ADRENAL GLANDS: No acute abnormality.   KIDNEYS, URETERS AND BLADDER: No stones in the kidneys or ureters. No hydronephrosis. No perinephric or periureteral stranding. Urinary bladder is unremarkable.   GI AND BOWEL: Appendix  absent. Stomach demonstrates no acute abnormality. There is no bowel obstruction.   PERITONEUM AND RETROPERITONEUM: No ascites. No free air.   VASCULATURE: Aorta is normal in caliber.   LYMPH NODES: No lymphadenopathy.   REPRODUCTIVE ORGANS: 3.1 cm simple cyst noted within the right ovary, likely representing a follicular cyst in a patient of this age. According to the Ovarian-Adnexal Reporting and Data System Ultrasound (O-RADS US ), the finding is consistent with O-RADS US  2 (simple cyst 3-5 cm in a premenopausal patient) and the recommendation is no follow-up is required. Small exophytic fundal fibroid noted.   BONES AND SOFT TISSUES: No acute osseous abnormality. No focal soft tissue abnormality.   IMPRESSION: 1. Nodular infiltrate in the right lower lobe with associated airway inflammation, likely infectious or inflammatory in the acute setting. 2. 3.1 cm simple cyst in the right ovary, consistent with O-RADS US  2. No follow-up required. 3. Small exophytic fundal fibroid.       Assessment & Plan:   1. Cyst of right ovary (Primary) Will check repeat pelvic ultrasound in 6-8 weeks for resolution. - US  PELVIC COMPLETE WITH TRANSVAGINAL; Future   I spent 30 minutes dedicated to the care of this patient including previsit review of records, face to face time with the patient discussing ultrasound and CT findings and post visit testing.  Jerilynn DELENA Buddle, MD Faculty Attending, [facility]

## 2024-07-08 ENCOUNTER — Ambulatory Visit (HOSPITAL_COMMUNITY)

## 2024-07-09 ENCOUNTER — Emergency Department (HOSPITAL_COMMUNITY)
Admission: EM | Admit: 2024-07-09 | Discharge: 2024-07-09 | Disposition: A | Discharge status: Home / Self Care | Attending: Emergency Medicine | Admitting: Emergency Medicine

## 2024-07-09 ENCOUNTER — Other Ambulatory Visit: Payer: Self-pay

## 2024-07-09 ENCOUNTER — Emergency Department (HOSPITAL_COMMUNITY)

## 2024-07-09 ENCOUNTER — Encounter (HOSPITAL_COMMUNITY): Payer: Self-pay

## 2024-07-09 DIAGNOSIS — R059 Cough, unspecified: Secondary | ICD-10-CM | POA: Diagnosis present

## 2024-07-09 DIAGNOSIS — J069 Acute upper respiratory infection, unspecified: Secondary | ICD-10-CM | POA: Diagnosis not present

## 2024-07-09 LAB — CBC
HCT: 35.4 % — ABNORMAL LOW (ref 36.0–46.0)
Hemoglobin: 11 g/dL — ABNORMAL LOW (ref 12.0–15.0)
MCH: 28.6 pg (ref 26.0–34.0)
MCHC: 31.1 g/dL (ref 30.0–36.0)
MCV: 91.9 fL (ref 80.0–100.0)
Platelets: 247 K/uL (ref 150–400)
RBC: 3.85 MIL/uL — ABNORMAL LOW (ref 3.87–5.11)
RDW: 17.9 % — ABNORMAL HIGH (ref 11.5–15.5)
WBC: 9.5 K/uL (ref 4.0–10.5)
nRBC: 0 % (ref 0.0–0.2)

## 2024-07-09 LAB — TROPONIN T, HIGH SENSITIVITY
Troponin T High Sensitivity: <15 ng/L (ref 0–19)
Troponin T High Sensitivity: <15 ng/L (ref 0–19)

## 2024-07-09 LAB — BASIC METABOLIC PANEL WITH GFR
Anion gap: 9 (ref 5–15)
BUN: 8 mg/dL (ref 6–20)
CO2: 18 mmol/L — ABNORMAL LOW (ref 22–32)
Calcium: 9 mg/dL (ref 8.9–10.3)
Chloride: 110 mmol/L (ref 98–111)
Creatinine, Ser: 0.88 mg/dL (ref 0.44–1.00)
GFR, Estimated: >60 mL/min (ref >60)
Glucose, Bld: 100 mg/dL — ABNORMAL HIGH (ref 70–99)
Potassium: 3.4 mmol/L — ABNORMAL LOW (ref 3.5–5.1)
Sodium: 137 mmol/L (ref 135–145)

## 2024-07-09 LAB — RESP PANEL BY RT-PCR (RSV, FLU A&B, COVID)  RVPGX2
Influenza A by PCR: NEGATIVE
Influenza B by PCR: NEGATIVE
Resp Syncytial Virus by PCR: NEGATIVE
SARS Coronavirus 2 by RT PCR: NEGATIVE

## 2024-07-09 LAB — HCG, SERUM, QUALITATIVE: Preg, Serum: NEGATIVE

## 2024-07-09 MED ORDER — ALBUTEROL SULFATE HFA 108 (90 BASE) MCG/ACT IN AERS
2.0000 | INHALATION_SPRAY | Freq: Once | RESPIRATORY_TRACT | Status: AC
  Administered 2024-07-09: 2 via RESPIRATORY_TRACT
  Filled 2024-07-09: qty 6.7

## 2024-07-09 NOTE — ED Provider Notes (Signed)
 [place] EMERGENCY DEPARTMENT AT [facility] Provider Note   CSN: [id number] Arrival date & time: 07/09/24  1726     Patient presents with: Cough and Chest Pain   Diana Huynh is a 43 y.o. female.  Presents today for productive cough since Tuesday along with chest pain while coughing.  Patient also reporting mid chest pain when she walks and dyspnea on exertion.  Patient also reporting congestion, sinus pressure, low grade fever, nausea without vomiting, body aches and loss of taste.  Patient denies fever, vomiting, diarrhea, abdominal pain, diplopia, numbness, weakness, or tinnitus.    Cough Associated symptoms: chest pain and fever   Chest Pain Associated symptoms: cough, fever and nausea        Prior to Admission medications   Medication Sig Start Date End Date Taking? Authorizing Provider  acetaZOLAMIDE  ER (DIAMOX ) 500 MG capsule Take 1 capsule (500 mg total) by mouth 2 (two) times daily. 12/18/23   Sater, Charlie LABOR, MD  albuterol  (VENTOLIN  HFA) [phone] Base) MCG/ACT inhaler Inhale 1-2 puffs into the lungs every 6 (six) hours as needed for wheezing or shortness of breath. 05/07/20   Babara, Amy V, PA-C  amLODipine  (NORVASC ) 5 MG tablet Take 5 mg by mouth daily. 08/02/22   [provider]  azaTHIOprine  (IMURAN ) 50 MG tablet Take 50 mg by mouth daily. 06/02/24   [provider]  butalbital -acetaminophen -caffeine  (FIORICET ) 50-325-40 MG tablet Take 1 tablet by mouth every 6 (six) hours as needed for headache or migraine. 06/17/24   Pokhrel, Vernal, MD  cyanocobalamin  1000 MCG tablet Take 1 tablet (1,000 mcg total) by mouth daily. 06/18/24   Pokhrel, Laxman, MD  DULoxetine  (CYMBALTA ) 60 MG capsule Take 60 mg by mouth every evening.    [provider]  famotidine  (PEPCID ) 20 MG tablet Take 20 mg by mouth daily.     [provider]  hydroxychloroquine  (PLAQUENIL ) 200 MG tablet Take 400 mg by mouth daily.    [provider]   ondansetron  (ZOFRAN -ODT) 4 MG disintegrating tablet Take 1 tablet (4 mg total) by mouth every 8 (eight) hours as needed for nausea or vomiting. 06/17/24   Pokhrel, Laxman, MD  pregabalin  (LYRICA ) 150 MG capsule Take 150 mg by mouth in the morning, at noon, and at bedtime.    [provider]  semaglutide -weight management (WEGOVY ) 0.25 MG/0.5ML SOAJ SQ injection Inject 0.25 mg into the skin once a week for 28 days. Patient not taking: Reported on 07/06/2024 06/25/24 07/23/24  Zheng, Michael, DO  semaglutide -weight management (WEGOVY ) 0.5 MG/0.5ML SOAJ SQ injection Inject 0.5 mg into the skin once a week for 28 days. 07/24/24 08/21/24  Elicia Sharper, DO  semaglutide -weight management (WEGOVY ) 1 MG/0.5ML SOAJ SQ injection Inject 1 mg into the skin once a week for 28 days. 08/22/24 09/19/24  Elicia Sharper, DO  semaglutide -weight management (WEGOVY ) 1.7 MG/0.75ML SOAJ SQ injection Inject 1.7 mg into the skin once a week for 28 days. 09/20/24 10/18/24  Zheng, Michael, DO  semaglutide -weight management (WEGOVY ) 2.4 MG/0.75ML SOAJ SQ injection Inject 2.4 mg into the skin once a week for 28 days. 10/19/24 11/16/24  Zheng, Michael, DO  sulindac  (CLINORIL ) 200 MG tablet Take 1 tablet (200 mg total) by mouth 2 (two) times daily. 03/16/24   Sater, Charlie LABOR, MD  tiZANidine  (ZANAFLEX ) 4 MG tablet Take 1 tablet (4 mg total) by mouth 3 (three) times daily. 03/10/24 09/06/24  Sater, Charlie LABOR, MD  triamcinolone  (KENALOG ) 0.025 % ointment Apply 1 Application  topically 2 (two) times daily. Patient not taking: Reported on 07/06/2024 02/19/23   Lemon Raisin, MD    Allergies: Patient has no known allergies.    Review of Systems  Constitutional:  Positive for fever.  HENT:  Positive for congestion and sinus pressure.   Respiratory:  Positive for cough.   Cardiovascular:  Positive for chest pain.  Gastrointestinal:  Positive for nausea.    Updated Vital Signs BP 128/73   Pulse 87   Temp 98.6 F (37 C) (Oral)    Resp (!) 23   Ht 5' 1 (1.549 m)   Wt 98 kg   LMP 06/16/2024 (Exact Date)   SpO2 100%   BMI 40.82 kg/m   Physical Exam Vitals and nursing note reviewed.  Constitutional:      General: She is not in acute distress.    Appearance: She is well-developed. She is not toxic-appearing.     Comments: Uncomfortable appearing  HENT:     Head: Normocephalic and atraumatic.     Nose: Congestion and rhinorrhea present.     Right Sinus: Maxillary sinus tenderness and frontal sinus tenderness present.     Left Sinus: Maxillary sinus tenderness and frontal sinus tenderness present.     Mouth/Throat:     Pharynx: Oropharynx is clear. Uvula midline. No oropharyngeal exudate or posterior oropharyngeal erythema.     Tonsils: No tonsillar exudate or tonsillar abscesses.  Eyes:     Conjunctiva/sclera: Conjunctivae normal.  Cardiovascular:     Rate and Rhythm: Normal rate and regular rhythm.     Heart sounds: No murmur heard. Pulmonary:     Effort: Pulmonary effort is normal. No respiratory distress.     Breath sounds: Normal breath sounds.     Comments: Airways sound tight without any audible wheeze Abdominal:     Palpations: Abdomen is soft.     Tenderness: There is no abdominal tenderness.  Musculoskeletal:        General: No swelling.     Cervical back: Neck supple.  Skin:    General: Skin is warm and dry.     Capillary Refill: Capillary refill takes less than 2 seconds.  Neurological:     General: No focal deficit present.     Mental Status: She is alert and oriented to person, place, and time.  Psychiatric:        Mood and Affect: Mood normal.     (all labs ordered are listed, but only abnormal results are displayed) Labs Reviewed  BASIC METABOLIC PANEL WITH GFR - Abnormal; Notable for the following components:      Result Value   Potassium 3.4 (*)    CO2 18 (*)    Glucose, Bld 100 (*)    All other components within normal limits  CBC - Abnormal; Notable for the following  components:   RBC 3.85 (*)    Hemoglobin 11.0 (*)    HCT 35.4 (*)    RDW 17.9 (*)    All other components within normal limits  RESP PANEL BY RT-PCR (RSV, FLU A&B, COVID)  RVPGX2  HCG, SERUM, QUALITATIVE  TROPONIN T, HIGH SENSITIVITY  TROPONIN T, HIGH SENSITIVITY    EKG: None  Radiology: DG Chest 2 View Result Date: 07/09/2024 CLINICAL DATA:  Chest pain and shortness of breath. Cough since Tuesday. Chest pain with cough. EXAM: CHEST - 2 VIEW COMPARISON:  06/14/2024 FINDINGS: Normal heart size and pulmonary vascularity. No focal airspace disease or consolidation in the lungs. No blunting of  costophrenic angles. No pneumothorax. Mediastinal contours appear intact. IMPRESSION: No active cardiopulmonary disease. Electronically Signed   By: Elsie Gravely M.D.   On: 07/09/2024 18:12     Procedures   Medications Ordered in the ED  albuterol  (VENTOLIN  HFA) 108 (90 Base) MCG/ACT inhaler 2 puff (2 puffs Inhalation Given 07/09/24 2204)                                    Medical Decision Making Amount and/or Complexity of Data Reviewed Labs: ordered. Radiology: ordered.  Risk Prescription drug management.   This patient presents to the ED for concern of URI symptoms differential diagnosis includes COVID, flu, RSV, pneumonia, viral URI, STEMI, NSTEMI, electrolyte abnormality    Additional history obtained   Additional history obtained from Electronic Medical Record External records from outside source obtained and reviewed including care everywhere   Lab Tests:  I Ordered, and personally interpreted labs.  The pertinent results include: CBC with mild anemia at 11.0 which is around patient's baseline, mild hypokalemia 3.4, mildly reduced CO2 at 18, negative pregnancy, respiratory panel negative, delta troponin less than 15   Imaging Studies ordered:  I ordered imaging studies including chest x-ray I independently visualized and interpreted imaging which showed no active  cardiopulmonary disease I agree with the radiologist interpretation EKG which showed sinus tachycardia   Medicines ordered and prescription drug management:  I ordered medication including albuterol  inhaler    I have reviewed the patients home medicines and have made adjustments as needed   Problem List / ED Course:  Considered for admission or further workup however patients vital signs, physical exam, labs, and imaging are reassuring. Patient's symptoms likely due to a viral upper respiratory infection. Patient advised to take Tylenol /Motrin  as needed for fever, Flonase as needed for nasal congestion, and plain Mucinex as needed for chest congestion. Patient should follow-up with their primary care in the upcoming week if their symptoms persist for further evaluation and workup.      Final diagnoses:  Viral upper respiratory tract infection    ED Discharge Orders     None          Francis Ileana LOISE DEVONNA 07/09/24 2207    Gravely Pac T, DO 07/09/24 2320

## 2024-07-09 NOTE — ED Provider Notes (Signed)
  Physical Exam  BP 128/73   Pulse 87   Temp 98.6 F (37 C) (Oral)   Resp (!) 21   Ht 5' 1 (1.549 m)   Wt 98 kg   LMP 06/16/2024 (Exact Date)   SpO2 99%   BMI 40.82 kg/m   Physical Exam  Procedures  Procedures  ED Course / MDM    Medical Decision Making Amount and/or Complexity of Data Reviewed Labs: ordered. Radiology: ordered.  Risk Prescription drug management.   Patient care assumed at shift handoff.  See previous provider's note for full details.  In short, patient with history of SLE presented to the emergency room with a cough which began on Tuesday.  She also complained of chest pain with coughing.  Workup consistent prior to my arrival with a likely viral infection causing a bronchitis.  Plan to evaluate patient after breathing treatment and ambulatory trial.  Patient reevaluated after breathing treatment.  Oxygen saturation with ambulation maintained at 99+ percent.  Patient was concerned that she may be having a stroke but endorses no focal neurologic deficits.  She does endorse some known neuropathy.  No sign of stroke/neurodeficit at this time.  Patient reassured.  Patient declines any prescriptions for steroids that she wishes to talk with her specialist first which is reasonable.  She has been provided with the inhaler from this visit and may use it every 6 hours for wheezing related to underlying bronchitis.  Return precautions have been provided.       Logan Ubaldo NOVAK, PA-C 07/09/24 2250    Mannie Pac T, DO 07/14/24 [phone]

## 2024-07-09 NOTE — Progress Notes (Signed)
 Patient maintained O2 saturations at 99% during ambulation walk with pulse ox per MD order. Patient remained in no distress.

## 2024-07-09 NOTE — ED Triage Notes (Signed)
 Cough since Tuesday with yellow/green sputum. C/o sharp pain in chest with coughing. Pt states she also has mid chest pain when she walks and feels SOB. Chest pain radiates more to right side and into back. C/o loss of taste and congestion.

## 2024-07-09 NOTE — Discharge Instructions (Signed)
 Today you were seen for an viral upper respiratory infection.  You may alternate taking Tylenol  and Motrin  as needed for fever and pain, Flonase as needed for nasal congestion, and plain Mucinex as needed for chest congestion.  You were also given an albuterol  inhaler while in the emergency department, you may use this every 6 hours as needed for airway tightness.  After reviewing your labs and imaging, I feel you are safe to go home. Please follow up with your PCP in the next several days and provide them with your records from this visit. Return to the Emergency Room if pain becomes severe or symptoms worsen.

## 2024-07-15 DIAGNOSIS — M328 Other forms of systemic lupus erythematosus: Secondary | ICD-10-CM | POA: Diagnosis not present

## 2024-07-15 DIAGNOSIS — Z79620 Long term (current) use of immunosuppressive biologic: Secondary | ICD-10-CM | POA: Diagnosis not present

## 2024-07-15 DIAGNOSIS — Z79624 Long term (current) use of inhibitors of nucleotide synthesis: Secondary | ICD-10-CM | POA: Diagnosis not present

## 2024-07-15 DIAGNOSIS — Z7952 Long term (current) use of systemic steroids: Secondary | ICD-10-CM | POA: Diagnosis not present

## 2024-07-15 DIAGNOSIS — I7300 Raynaud's syndrome without gangrene: Secondary | ICD-10-CM | POA: Diagnosis not present

## 2024-07-15 DIAGNOSIS — G932 Benign intracranial hypertension: Secondary | ICD-10-CM | POA: Diagnosis not present

## 2024-07-15 DIAGNOSIS — M797 Fibromyalgia: Secondary | ICD-10-CM | POA: Diagnosis not present

## 2024-07-15 DIAGNOSIS — Z79899 Other long term (current) drug therapy: Secondary | ICD-10-CM | POA: Diagnosis not present

## 2024-07-16 ENCOUNTER — Telehealth (INDEPENDENT_AMBULATORY_CARE_PROVIDER_SITE_OTHER)

## 2024-07-16 ENCOUNTER — Telehealth: Payer: Self-pay | Admitting: *Deleted

## 2024-07-16 DIAGNOSIS — J069 Acute upper respiratory infection, unspecified: Secondary | ICD-10-CM

## 2024-07-16 MED ORDER — GUAIFENESIN ER 600 MG PO TB12: 600.0000 mg | ORAL_TABLET | Freq: Two times a day (BID) | ORAL | 0 refills | Status: AC

## 2024-07-16 MED ORDER — BENZONATATE 100 MG PO CAPS: 100.0000 mg | ORAL_CAPSULE | Freq: Three times a day (TID) | ORAL | 0 refills | Status: AC | PRN

## 2024-07-16 NOTE — Telephone Encounter (Signed)
 Called pt - pt repeated what is noted in CRM's note. Pt stated continues c/o cold,cough,congestion, and body aches. Stated she does not want to come in for an appt; stated she had to dragged herself out of the bed to see the Rheumatologist. She has taken OTC meds which have not helped; and does want to spend more money on meds that does not work. Pt agreed to virtual appt , stated she has a smart phone. Video appt scheduled w/ Dr Kem today @ [phone].

## 2024-07-16 NOTE — Telephone Encounter (Signed)
 Copied from CRM #[id number]. Topic: Clinical - Medication Question >> Jul 16, 2024  8:29 AM Susanna ORN wrote: Reason for CRM: Patient states she's not feeling well and would like for any provider to give her a call. She states she's not been feeling well since last Tuesday and wants a provider to write her a prescription for some cough pills. Patient also states she has chest congestion & a stuffy nose but no fever. Went to ED last Thursday and also went to rheumatologist and they placed her on prednisone  for lupus and told her to contact her PCP. Patient states she's been taking tylenol  cold & flu since last Tuesday and it's not helping. Please give patient a call back at [phone].

## 2024-07-16 NOTE — Progress Notes (Addendum)
 Virtual Visit via Video Note  I connected with Diana Huynh on 07/16/24 at  9:15 AM EDT by a video enabled telemedicine application and verified that I am speaking with the correct person using two identifiers.  Patient Location: Home Provider Location: Office/Clinic  I discussed the limitations, risks, security, and privacy concerns of performing an evaluation and management service by video and the availability of in person appointments. I also discussed with the patient that there may be a patient responsible charge related to this service. The patient expressed understanding and agreed to proceed.  Subjective: PCP: Bernadine Manos, MD  No chief complaint on file.  HPI Patient is a 43-year-old female past medical history of systemic lupus erythematosus, OSA, morbid obesity, idiopathic intracranial hypertension, fibromyalgia, dysmenorrhea, Raynaud's disease, history of pericarditis that presents to day for video visit due to not feeling well.  Patient states that she has been having congestion, cough with yellowish sputum and now clearing up to white  ,body aches, headaches for a little over a week.  She denies any nausea, vomiting, diarrhea.  She states that she has not been eating well because she does not have much of an appetite.  She has had low-grade elevated temperatures with max of 100 last week but not any fevers this week.  Patient states that cough and congestion are her main problems.  She has been taking Tylenol  cold and flu, every 6 hours. since last Wednesday ran out on Monday which is when she started noticing that she was feeling achy all over.  He also started her menstrual cycle within this week and states that this is contributing to the pain along with her fibromyalgia.  Patient states the last time she felt like this was during the COVID era.   Of note she went to the emergency room and they gave breathing treatments.  She states that she has no history of asthma and  that her inhaler helped that they gave her in the ED.  They also tested her for COVID and influenza at that time and this was found to be negative.    Followed up with her rheumatologist for lupus, and he prescribed her prednisone  40 mg for 5 days.  He is temporarily stopping azathiprine for now until patient feels better but continuing her on her Plaquenil  400 mg.  She is not currently on any other infusions.   ROS: Per HPI  Current Outpatient Medications:    benzonatate  (TESSALON ) 100 MG capsule, Take 1 capsule (100 mg total) by mouth 3 (three) times daily as needed for cough., Disp: 30 capsule, Rfl: 0   guaiFENesin (MUCINEX) 600 MG 12 hr tablet, Take 1 tablet (600 mg total) by mouth 2 (two) times daily., Disp: 60 tablet, Rfl: 0   acetaZOLAMIDE  ER (DIAMOX ) 500 MG capsule, Take 1 capsule (500 mg total) by mouth 2 (two) times daily., Disp: 180 capsule, Rfl: 3   albuterol  (VENTOLIN  HFA) 108 (90 Base) MCG/ACT inhaler, Inhale 1-2 puffs into the lungs every 6 (six) hours as needed for wheezing or shortness of breath., Disp: 8 g, Rfl: 0   amLODipine  (NORVASC ) 5 MG tablet, Take 5 mg by mouth daily., Disp: , Rfl:    azaTHIOprine  (IMURAN ) 50 MG tablet, Take 50 mg by mouth daily., Disp: , Rfl:    butalbital -acetaminophen -caffeine  (FIORICET ) 50-325-40 MG tablet, Take 1 tablet by mouth every 6 (six) hours as needed for headache or migraine., Disp: 30 tablet, Rfl: 0   cyanocobalamin  1000 MCG tablet, Take 1  tablet (1,000 mcg total) by mouth daily., Disp: 90 tablet, Rfl: 0   DULoxetine  (CYMBALTA ) 60 MG capsule, Take 60 mg by mouth every evening., Disp: , Rfl:    famotidine  (PEPCID ) 20 MG tablet, Take 20 mg by mouth daily. , Disp: , Rfl:    hydroxychloroquine  (PLAQUENIL ) 200 MG tablet, Take 400 mg by mouth daily., Disp: , Rfl:    ondansetron  (ZOFRAN -ODT) 4 MG disintegrating tablet, Take 1 tablet (4 mg total) by mouth every 8 (eight) hours as needed for nausea or vomiting., Disp: 20 tablet, Rfl: 0   pregabalin   (LYRICA ) 150 MG capsule, Take 150 mg by mouth in the morning, at noon, and at bedtime., Disp: , Rfl:    semaglutide -weight management (WEGOVY ) 0.25 MG/0.5ML SOAJ SQ injection, Inject 0.25 mg into the skin once a week for 28 days. (Patient not taking: Reported on 07/06/2024), Disp: 2 mL, Rfl: 0   [START ON 07/24/2024] semaglutide -weight management (WEGOVY ) 0.5 MG/0.5ML SOAJ SQ injection, Inject 0.5 mg into the skin once a week for 28 days., Disp: 2 mL, Rfl: 0   [START ON 08/22/2024] semaglutide -weight management (WEGOVY ) 1 MG/0.5ML SOAJ SQ injection, Inject 1 mg into the skin once a week for 28 days., Disp: 2 mL, Rfl: 0   [START ON 09/20/2024] semaglutide -weight management (WEGOVY ) 1.7 MG/0.75ML SOAJ SQ injection, Inject 1.7 mg into the skin once a week for 28 days., Disp: 3 mL, Rfl: 0   [START ON 10/19/2024] semaglutide -weight management (WEGOVY ) 2.4 MG/0.75ML SOAJ SQ injection, Inject 2.4 mg into the skin once a week for 28 days., Disp: 3 mL, Rfl: 0   sulindac  (CLINORIL ) 200 MG tablet, Take 1 tablet (200 mg total) by mouth 2 (two) times daily., Disp: 60 tablet, Rfl: 5   tiZANidine  (ZANAFLEX ) 4 MG tablet, Take 1 tablet (4 mg total) by mouth 3 (three) times daily., Disp: 90 tablet, Rfl: 5   triamcinolone  (KENALOG ) 0.025 % ointment, Apply 1 Application topically 2 (two) times daily. (Patient not taking: Reported on 07/06/2024), Disp: 30 g, Rfl: 0  Current Facility-Administered Medications:    bupivacaine  (MARCAINE ) 0.5 % (with pres) injection 4 mL, 4 mL, Infiltration, Once, Sater, Charlie LABOR, MD  Observations/Objective: There were no vitals filed for this visit. Physical Exam tired but stable in no acute distress speaking in full sentences able to follow requests  Patient was able to extend her neck minimally and stand up  No tenderness to palpation over frontal sinuses Assessment and Plan: Assessment & Plan Viral upper respiratory tract infection with cough Not have vitals and cannot do a thorough  physical exam it is hard to assess what this could be.  Do suspect that from what patient is saying that she could have postviral cough and congestion that is taking some time to clear up.  Glad to hear that her fevers have gone away and that her mucus from her productive cough seems to be clearing up.  I am still concerned the patient is having these bodyaches all over and do wonder if this is beyond viral infection and has something to do with lupus or the medications that she is on.  Seems that patient has good follow-up with her rheumatologist and he has stated if she is not feeling better on Monday to let him know so that you can prescribe some more prednisone .  Plan: Will treat with supportive care such as Tessalon  Perles and Mucinex along with patient still using her inhaler. Did counsel patient that if she is continuing to feel  sick or feels like she is getting worse over the next 2 days to either try to be seen in the clinic or go to the emergency room again.        Follow Up Instructions: No follow-ups on file.     The patient was advised to call back or seek an in-person evaluation if the symptoms worsen or if the condition fails to improve as anticipated.    Patient seen with Dr. Jeanelle Na D'Mello, DO

## 2024-07-16 NOTE — Assessment & Plan Note (Addendum)
 Not have vitals and cannot do a thorough physical exam it is hard to assess what this could be.  Do suspect that from what patient is saying that she could have postviral cough and congestion that is taking some time to clear up.  Glad to hear that her fevers have gone away and that her mucus from her productive cough seems to be clearing up.  I am still concerned the patient is having these bodyaches all over and do wonder if this is beyond viral infection and has something to do with lupus or the medications that she is on.  Seems that patient has good follow-up with her rheumatologist and he has stated if she is not feeling better on Monday to let him know so that you can prescribe some more prednisone .  Plan: Will treat with supportive care such as Tessalon  Perles and Mucinex along with patient still using her inhaler. Did counsel patient that if she is continuing to feel sick or feels like she is getting worse over the next 2 days to either try to be seen in the clinic or go to the emergency room again.

## 2024-07-21 DIAGNOSIS — M3219 Other organ or system involvement in systemic lupus erythematosus: Secondary | ICD-10-CM | POA: Diagnosis not present

## 2024-07-21 NOTE — Progress Notes (Signed)
  [facility]  - [facility] [place] [address] [place] [address] Dept: [id number]   Date: 07/21/2024   Ordering Provider:  Alm Morene Latina, MD   Diana Huynh    1981-08-15  [id number]     Mode of Arrival:  AMB Driven home by self   Encounter Diagnosis  Name Primary?  . Other systemic lupus erythematosus with other organ involvement Yes    Vitals:   07/21/24 0922 07/21/24 1035 07/21/24 1050  BP: 123/72 120/60 129/66  Pulse: 68 69 72  Resp: 18    Temp: 97.4 F (36.3 C)    TempSrc: Temporal    SpO2: 100% 100% 100%  Weight: 99.3 kg (218 lb 14.7 oz)            Administrations This Visit     acetaminophen  (TYLENOL ) tablet 1,000 mg     Admin Date 07/21/2024 Action Given Dose 975 mg Route oral Documented By Allean JAYSON Metro, RN         anifrolumab -fnia (SAPHNELO ) 300 mg in sodium chloride  0.9 % 100 mL IVPB     Admin Date 07/21/2024 Action New Bag Dose 300 mg Rate 200 mL/hr Route intravenous Documented By Allean JAYSON Metro, RN         diphenhydrAMINE  (BENADRYL ) capsule 25 mg     Admin Date 07/21/2024 Action Given Dose 25 mg Route oral Documented By Allean JAYSON Metro, RN            Medication Saphnello Dose: 300mg  Wastage: 0mg  The infusion medication was provided by: [employer].   Medication additional information None   Venous access - A 24 gauge angiocatheter was started in the left antecubital vein on the 2nd attempt. Flashback was observed and the catheter was flushing well.  The site was without redness, swelling or pain.  The  patient tolerated the insertion well.  The catheter was covered with clear dressing and secured with tape. - The IV was removed after the was infusion completed.  The catheter tip was intact.  There was no redness, swelling or pain at the infusion site. The wound was covered with gauze and coban.  The patient tolerated the infusion well.     Labs  Collected: CBCD, Urine protein,UA,AST,ALT,Creatinine, dsDNA Antibody, Complement C3,C4 The last Quantiferon test was done 08/15/23 and the result was negative.   Infusion Staff Signature: Allean JAYSON Metro, RN   Direct Supervision:  Dr. Ziolkowska  Start:1020  Stop:1050

## 2024-07-23 NOTE — Progress Notes (Signed)
 Internal Medicine Clinic Attending  I was physically present during the key portions of the resident provided service and participated in the medical decision making of patient's management care. I reviewed pertinent patient test results.  The assessment, diagnosis, and plan were formulated together and I agree with the documentation in the resident's note.  Jeanelle Layman CROME, MD

## 2024-07-28 ENCOUNTER — Other Ambulatory Visit: Payer: Self-pay | Admitting: Medical Genetics

## 2024-08-03 DIAGNOSIS — G894 Chronic pain syndrome: Secondary | ICD-10-CM | POA: Diagnosis not present

## 2024-08-03 DIAGNOSIS — M7918 Myalgia, other site: Secondary | ICD-10-CM | POA: Diagnosis not present

## 2024-08-03 DIAGNOSIS — M4802 Spinal stenosis, cervical region: Secondary | ICD-10-CM | POA: Diagnosis not present

## 2024-08-03 DIAGNOSIS — M542 Cervicalgia: Secondary | ICD-10-CM | POA: Diagnosis not present

## 2024-08-05 ENCOUNTER — Telehealth: Payer: Self-pay

## 2024-08-05 NOTE — Telephone Encounter (Signed)
 Patient LVM on sleep lab phone asking about sleep study orders, I do not see any. Not sure if Dr. Vear was to put a sleep study order in last OV? Could someone please check and if the patient is to have a sleep study let us  know so we can start an authorization for one? Thanks!

## 2024-08-06 DIAGNOSIS — M4802 Spinal stenosis, cervical region: Secondary | ICD-10-CM | POA: Diagnosis not present

## 2024-08-06 NOTE — Telephone Encounter (Signed)
 PLEASE SEE THE  NOTE IN THE CHART AS NO REFERRAL IS NEEDED AND THE PT NEEDS TO CALL    [facility] in [place], [place]  Address: [address], [place], [place] [address] Phone: [phone]  Type Date User Summary Attachment  General 04/20/2024 11:31 AM Jolie Law B, NT Pt is already est in our office, Dr. Vear agreeable to seeing patient in a regular office visit follow up slot. No new patient referral needed. -  Note: Pt is already est in our office, Dr. Vear agreeable to seeing patient in a regular office visit follow up slot. No new patient referral needed.  .   Copied from CRM [id number]. Topic: Referral - Status >> Aug 06, 2024  8:07 AM Cherylann RAMAN wrote: Reason for CRM: Patient called requesting the status of her referral that was sent over to [facility]. Patient states that she was never contacted to schedule an appt. However, per the [employer] between patient and [place], [place], [place] states that the referral has been cancelled. Please contact [phone].

## 2024-08-18 DIAGNOSIS — Z79899 Other long term (current) drug therapy: Secondary | ICD-10-CM | POA: Diagnosis not present

## 2024-08-18 DIAGNOSIS — Z79620 Long term (current) use of immunosuppressive biologic: Secondary | ICD-10-CM | POA: Diagnosis not present

## 2024-08-18 DIAGNOSIS — M328 Other forms of systemic lupus erythematosus: Secondary | ICD-10-CM | POA: Diagnosis not present

## 2024-08-18 DIAGNOSIS — M3219 Other organ or system involvement in systemic lupus erythematosus: Secondary | ICD-10-CM | POA: Diagnosis not present

## 2024-08-18 DIAGNOSIS — Z79624 Long term (current) use of inhibitors of nucleotide synthesis: Secondary | ICD-10-CM | POA: Diagnosis not present

## 2024-08-20 ENCOUNTER — Ambulatory Visit: Admitting: Neurology

## 2024-08-20 ENCOUNTER — Encounter: Payer: Self-pay | Admitting: Neurology

## 2024-08-20 VITALS — BP 115/71 | HR 85 | Ht 61.0 in | Wt 220.6 lb

## 2024-08-20 DIAGNOSIS — M3219 Other organ or system involvement in systemic lupus erythematosus: Secondary | ICD-10-CM | POA: Diagnosis not present

## 2024-08-20 DIAGNOSIS — R0683 Snoring: Secondary | ICD-10-CM

## 2024-08-20 DIAGNOSIS — G932 Benign intracranial hypertension: Secondary | ICD-10-CM | POA: Diagnosis not present

## 2024-08-20 DIAGNOSIS — G4719 Other hypersomnia: Secondary | ICD-10-CM

## 2024-08-20 DIAGNOSIS — G4733 Obstructive sleep apnea (adult) (pediatric): Secondary | ICD-10-CM

## 2024-08-20 NOTE — Progress Notes (Signed)
 [facility]  PATIENT: Diana Huynh DOB: 1980-12-18  REFERRING DOCTOR OR PCP: Cyndee Dada, MD; Rayann Atway, DO; Lonni Fairly, MD SOURCE: Patient, notes from ophthalmology, imaging and lab reports, MRI images personally reviewed.  _________________________________   HISTORICAL  CHIEF COMPLAINT:  Chief Complaint  Patient presents with   Follow-up    Pt in room 10. Alone.Here for cervical stenosis follow up. PCP order sleep study    HISTORY OF PRESENT ILLNESS:  Diana Huynh is a 43 y.o. 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  Her husband has noted OSA when sleeping in her back.   She uses pillows to try to elevate her with some benefit.    HA is worse when she has a bad night's sleep   She noted fatigue and sleepiness during the day.  She feels lupus also contributes to her fatigue.     Weight is fluctuating some but > 220 x several years..  She reports her PCP ordered Wegovy  or Zepbound  but she was recommended to have sleep study first.  She denies cataplexy, no sleep paralysis.     EPWORTH SLEEPINESS SCALE  On a scale of 0 - 3 what is the chance of dozing:  Sitting and Reading:   3 Watching TV:    3 Sitting inactive in a public place: 1 Passenger in car for one hour: 3 Lying down to rest in the afternoon: 3 Sitting and talking to someone: 1 Sitting quietly after lunch:  3 In a car, stopped in traffic:  1  Total (out of 24):   18/24 (moderately EDS)  She has IIH .  She feels HA is stable and usually mild.     Acetazolamide  has helped some.  HA worse if she gets up quickly.   LP was performed showing an elevated pressure of 35 cm.   She sees Dr. Fairly [facility]).  She saw him on Monday.  She has not noticing any recent pulsatile tinnitus.  She feels vision is doing about the same as the last visit.  The left eye is worse on the  right.   She remains on Diamox  500 mg twice a day.   She has tolerated fairly well.   Notes from Dr. Fairly earier 2025 showed  Visual fields were similar to the prior  visit.  OCT was read as normal on the right and just slightly abnormal on the left.   She is reporting pain in both arms, right >left associated with numbness.  Pain is worse if she puts pressure on the elbow region.    Symptoms started abut a year ago but have worsened.   She sees Dr. Mai (Rheumatology) for SLE.  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     She also has neck pain.     She has known spinal stenosis at C4-C5  in her cervical spine.   She had a TPI with only a couple days benefit.    She is on Lyrica  and duloxetine  for a while and tizanidine  was recently started   A steroid pack had not helped.  She takes ibuprofen  prn  SHe is on Saphnelo , azathioprine  and HCQ for SLE - se had pericarditis.  She has a h/o pneumonia recently.  We discussed she really needs to quit smoking.   Chantix  had not helped her so she tried nicotine gum.  History of IIH She noted headaches and visual blurring around April 2024.  Initially, symptoms were mild but she felt that they worsened in May and June.SABRA  She wakes up with a bad HA since June 2024.  Headaches are bilateral with a pounding quality.  Initially she was felt to be having a lupus flare.   She saw optometry for her annular Plaquenil  exam (to assess for macular edema) and then was referred to Dr. Lonni Fairly (Ophthalmology) due to bilateral optic disc edema.   This exam confirmed the optic disc edema.  Additional testing showed she had reduced visual fields bilaterally.   VA was 20/25-1 each eye.   IOP was normal.      She went to the ED for further evaluation.  She had MRI/MRV that were normal.  Of note, no empty sella is noted.   Optic nerve sheaths are normal diameter.   No thrombosis noted.  She was given a prescription for butalbital .   She feels that the  butalbital  has helped the headaches.  She had an LP and OP was elevated at 35 cm.   Diamox  500 mg po bid was started.     Other medical issues: She has SLE with dsDNA Ab = 51 (0-9)    She is on Plaquenil .    She sees [facility]. [facility] at [facility].     She sees Pain Management in [facility] (Dr. Corie) and reports she will be having a cervical ESI.    She snores but has no witnessed OSA.      Imaging: MRI of the brain 05/08/2023 was normal.  MRI of the orbits 05/08/2023 was normal.  MR venogram of the head 05/08/2023 showed mild asymmetry of the transverse sinuses but there did not appear to be any thrombosis.  MRI cervical spine 101/2022 showed disc protrusion at C4-C5 with resultant mild spinal stenosis.   Milder DDD at [facility] and C5C6.   Foci in parotid glands were also seen.     REVIEW OF SYSTEMS: Constitutional: No fevers, chills, sweats, or change in appetite Eyes: As above\ Ear, nose and throat: No hearing loss, ear pain, nasal congestion, sore throat Cardiovascular: No chest pain, palpitations Respiratory:  No shortness of breath at rest or with exertion.   No wheezes GastrointestinaI: No nausea, vomiting, diarrhea, abdominal pain, fecal incontinence Genitourinary:  No dysuria, urinary retention or frequency.  No nocturia. Musculoskeletal: She reports a lot of joint pain and muscle pain Integumentary: No rash, pruritus, skin lesions Neurological: as above Psychiatric: No depression at this time.  No anxiety Endocrine: No palpitations, diaphoresis, change in appetite, change in weigh or increased thirst Hematologic/Lymphatic:  No anemia, purpura, petechiae. Allergic/Immunologic: No itchy/runny eyes, nasal congestion, recent allergic reactions, rashes  ALLERGIES: No Known Allergies  HOME MEDICATIONS:  Current Outpatient Medications:    acetaZOLAMIDE  ER (DIAMOX ) 500 MG capsule, Take 1 capsule (500 mg total) by mouth 2 (two) times daily., Disp: 180 capsule, Rfl: 3   albuterol  (VENTOLIN   HFA) 108 (90 Base) MCG/ACT inhaler, Inhale 1-2 puffs into the lungs every 6 (six) hours as needed for wheezing or shortness of breath., Disp: 8 g, Rfl: 0   amLODipine  (NORVASC ) 5 MG tablet, Take 5 mg by mouth daily., Disp: , Rfl:    azaTHIOprine  (IMURAN ) 50 MG tablet, Take 50 mg by mouth daily., Disp: , Rfl:    butalbital -acetaminophen -caffeine  (FIORICET ) 50-325-40 MG tablet, Take 1 tablet by mouth every 6 (six) hours as needed for headache or migraine., Disp: 30  tablet, Rfl: 0   cyanocobalamin  1000 MCG tablet, Take 1 tablet (1,000 mcg total) by mouth daily., Disp: 90 tablet, Rfl: 0   DULoxetine  (CYMBALTA ) 60 MG capsule, Take 60 mg by mouth every evening., Disp: , Rfl:    famotidine  (PEPCID ) 20 MG tablet, Take 20 mg by mouth daily. , Disp: , Rfl:    hydroxychloroquine  (PLAQUENIL ) 200 MG tablet, Take 400 mg by mouth daily., Disp: , Rfl:    ondansetron  (ZOFRAN -ODT) 4 MG disintegrating tablet, Take 1 tablet (4 mg total) by mouth every 8 (eight) hours as needed for nausea or vomiting., Disp: 20 tablet, Rfl: 0   pregabalin  (LYRICA ) 150 MG capsule, Take 150 mg by mouth in the morning, at noon, and at bedtime., Disp: , Rfl:    sulindac  (CLINORIL ) 200 MG tablet, Take 1 tablet (200 mg total) by mouth 2 (two) times daily., Disp: 60 tablet, Rfl: 5   tiZANidine  (ZANAFLEX ) 4 MG tablet, Take 1 tablet (4 mg total) by mouth 3 (three) times daily., Disp: 90 tablet, Rfl: 5   triamcinolone  (KENALOG ) 0.025 % ointment, Apply 1 Application topically 2 (two) times daily., Disp: 30 g, Rfl: 0   benzonatate  (TESSALON ) 100 MG capsule, Take 1 capsule (100 mg total) by mouth 3 (three) times daily as needed for cough. (Patient not taking: Reported on 08/20/2024), Disp: 30 capsule, Rfl: 0   guaiFENesin (MUCINEX) 600 MG 12 hr tablet, Take 1 tablet (600 mg total) by mouth 2 (two) times daily. (Patient not taking: Reported on 08/20/2024), Disp: 60 tablet, Rfl: 0   semaglutide -weight management (WEGOVY ) 0.5 MG/0.5ML SOAJ SQ injection,  Inject 0.5 mg into the skin once a week for 28 days. (Patient not taking: Reported on 08/20/2024), Disp: 2 mL, Rfl: 0   [START ON 08/22/2024] semaglutide -weight management (WEGOVY ) 1 MG/0.5ML SOAJ SQ injection, Inject 1 mg into the skin once a week for 28 days. (Patient not taking: Reported on 08/20/2024), Disp: 2 mL, Rfl: 0   [START ON 09/20/2024] semaglutide -weight management (WEGOVY ) 1.7 MG/0.75ML SOAJ SQ injection, Inject 1.7 mg into the skin once a week for 28 days. (Patient not taking: Reported on 08/20/2024), Disp: 3 mL, Rfl: 0   [START ON 10/19/2024] semaglutide -weight management (WEGOVY ) 2.4 MG/0.75ML SOAJ SQ injection, Inject 2.4 mg into the skin once a week for 28 days. (Patient not taking: Reported on 08/20/2024), Disp: 3 mL, Rfl: 0  Current Facility-Administered Medications:    bupivacaine  (MARCAINE ) 0.5 % (with pres) injection 4 mL, 4 mL, Infiltration, Once, Oliviarose Punch, Charlie LABOR, MD  PAST MEDICAL HISTORY: Past Medical History:  Diagnosis Date   Bronchitis    Fibromyalgia    Lupus    Pericarditis    per pt report   Pleuritis 12/31/2017    PAST SURGICAL HISTORY: Past Surgical History:  Procedure Laterality Date   ANKLE SURGERY     APPENDECTOMY     CESAREAN SECTION      FAMILY HISTORY: Family History  Problem Relation Age of Onset   Hypertension Mother    Heart murmur Mother    Healthy Father    Thyroid  disease Sister    Heart failure Sister        has ICD   Cervical cancer Sister    Breast cancer Paternal Aunt    Breast cancer Paternal Grandmother    Neuropathy Neg Hx     SOCIAL HISTORY: Social History   Socioeconomic History   Marital status: Married    Spouse name: Not on file   Number of children:  Not on file   Years of education: Not on file   Highest education level: Associate degree: academic program  Occupational History   Not on file  Tobacco Use   Smoking status: Every Day    Current packs/day: 0.00    Types: Cigarettes    Last attempt to quit:  09/20/2018    Years since quitting: 5.9   Smokeless tobacco: Never   Tobacco comments:    4-5 cigs/day  Vaping Use   Vaping status: Former  Substance and Sexual Activity   Alcohol use: Not Currently   Drug use: No   Sexual activity: Yes  Other Topics Concern   Not on file  Social History Narrative   Caffiene coffee 2-4 cups daily.    Working:  none   Husband  with 2 grown kids.    Social Drivers of Health   Financial Resource Strain: Medium Risk (04/10/2024)   Overall Financial Resource Strain (CARDIA)    Difficulty of Paying Living Expenses: Somewhat hard  Food Insecurity: Food Insecurity Present (06/15/2024)   Hunger Vital Sign    Worried About Running Out of Food in the Last Year: Sometimes true    Ran Out of Food in the Last Year: Sometimes true  Transportation Needs: No Transportation Needs (06/15/2024)   PRAPARE - Administrator, Civil Service (Medical): No    Lack of Transportation (Non-Medical): No  Physical Activity: Inactive (04/10/2024)   Exercise Vital Sign    Days of Exercise per Week: 0 days    Minutes of Exercise per Session: Not on file  Stress: Stress Concern Present (04/10/2024)   [employer] of Occupational Health - Occupational Stress Questionnaire    Feeling of Stress: Very much  Social Connections: Socially Isolated (04/10/2024)   Social Connection and Isolation Panel    Frequency of Communication with Friends and Family: Once a week    Frequency of Social Gatherings with Friends and Family: Never    Attends Religious Services: Never    Database Administrator or Organizations: No    Attends Engineer, Structural: Not on file    Marital Status: Married  Catering Manager Violence: Not At Risk (06/15/2024)   Humiliation, Afraid, Rape, and Kick questionnaire    Fear of Current or Ex-Partner: No    Emotionally Abused: No    Physically Abused: No    Sexually Abused: No       PHYSICAL EXAM  Vitals:   08/20/24 0822  BP:  115/71  Pulse: 85  Weight: 220 lb 9.6 oz (100.1 kg)  Height: 5' 1 (1.549 m)    Body mass index is 41.68 kg/m.   General: The patient is well-developed and well-nourished and in no acute distress  HEENT:  Head is [place]/AT.  Sclera are anicteric.  Funduscopic examination was normal again on today.    Skin: Extremities are without rash or  edema.  Musculoskeletal: She has tenderness at cervical paraspinal muscles and subacromial bursae  bilateral.  Neurologic Exam  Mental status: The patient is alert and oriented x 3 at the time of the examination. The patient has apparent normal recent and remote memory, with an apparently normal attention span and concentration ability.   Speech is normal.  Cranial nerves: Extraocular movements are full. Pupils are equal, round, and reactive to light and accomodation.      There is good facial sensation to soft touch bilaterally.Facial strength is normal.   . No obvious hearing deficits are noted.  Motor:  Muscle bulk is normal.   Tone is normal. Strength is  5 / 5 in all 4 extremities.   Sensory: Sensory testing is intact to pinprick, soft touch and vibration sensation in all 4 extremities.  Coordination: Cerebellar testing reveals good finger-nose-finger and heel-to-shin bilaterally.  Gait and station: Station is normal.   Gait is arthritic . Tandem gait is mildly wide. Romberg is negative.   Reflexes: Deep tendon reflexes are symmetric and normal bilaterally.       DIAGNOSTIC DATA (LABS, IMAGING, TESTING) - I reviewed patient records, labs, notes, testing and imaging myself where available.  Lab Results  Component Value Date   WBC 9.5 07/09/2024   HGB 11.0 (L) 07/09/2024   HCT 35.4 (L) 07/09/2024   MCV 91.9 07/09/2024   PLT 247 07/09/2024      Component Value Date/Time   NA 137 07/09/2024 1832   NA 137 06/25/2024 1010   K 3.4 (L) 07/09/2024 1832   CL 110 07/09/2024 1832   CO2 18 (L) 07/09/2024 1832   GLUCOSE 100 (H) 07/09/2024  1832   BUN 8 07/09/2024 1832   BUN 14 06/25/2024 1010   CREATININE 0.88 07/09/2024 1832   CALCIUM 9.0 07/09/2024 1832   PROT 6.9 06/15/2024 0542   PROT 7.1 01/30/2018 1042   ALBUMIN 4.1 06/15/2024 0542   ALBUMIN 4.4 01/30/2018 1042   AST 15 06/15/2024 0542   ALT 7 06/15/2024 0542   ALKPHOS 73 06/15/2024 0542   BILITOT 0.4 06/15/2024 0542   BILITOT <0.2 01/30/2018 1042   GFRNONAA >60 07/09/2024 1832   GFRAA 96 05/12/2020 1727   Lab Results  Component Value Date   CHOL 171 06/12/2024   HDL 43 06/12/2024   LDLCALC 116 (H) 06/12/2024   TRIG 64 06/12/2024   CHOLHDL 4.0 06/12/2024   Lab Results  Component Value Date   HGBA1C 5.6 06/12/2024   Lab Results  Component Value Date   VITAMINB12 102 (L) 06/12/2024   Lab Results  Component Value Date   TSH 1.560 02/19/2023       ASSESSMENT AND PLAN  Idiopathic intracranial hypertension  OSA (obstructive sleep apnea) - Plan: Home sleep test  Excessive daytime sleepiness - Plan: Home sleep test  Snoring - Plan: Home sleep test  Obesity, morbid, BMI 40.0-49.9 (HCC)  Systemic lupus erythematosus with other organ involvement, unspecified SLE type (HCC)   The optic discs are still normal and she will continue acetazolamide . Home sleep study -- if OSA consider CPAP, encouraged weight loss and stop smoking She cervical spinal stenosis shehas been told (recent MRI at [facility] - I can't dig up results).  I personally saw mild cervical spinal stenosis at one level on 2022 MRI - was not bad enough for surgery at that time).   She also has pain from SLE and fibromyalgia.  She will continue Lyrica  and Cymbalta . Encouraged to lose weight Rtc 12 months, sooner if needed for sleep or if new or worsening issues   Lashara Urey A. Vear, MD, [facility] 08/20/2024, 9:02 AM Certified in Neurology, Clinical Neurophysiology, Sleep Medicine and Neuroimaging  [facility] [address] [place], [place] [address] [phone]

## 2024-08-31 ENCOUNTER — Ambulatory Visit (HOSPITAL_COMMUNITY)
Admission: RE | Admit: 2024-08-31 | Discharge: 2024-08-31 | Disposition: A | Discharge status: Home / Self Care | Source: Ambulatory Visit | Attending: Obstetrics and Gynecology | Admitting: Obstetrics and Gynecology

## 2024-08-31 DIAGNOSIS — N83201 Unspecified ovarian cyst, right side: Secondary | ICD-10-CM | POA: Diagnosis not present

## 2024-08-31 DIAGNOSIS — N83202 Unspecified ovarian cyst, left side: Secondary | ICD-10-CM | POA: Diagnosis not present

## 2024-08-31 DIAGNOSIS — D259 Leiomyoma of uterus, unspecified: Secondary | ICD-10-CM | POA: Diagnosis not present

## 2024-09-01 DIAGNOSIS — M542 Cervicalgia: Secondary | ICD-10-CM | POA: Diagnosis not present

## 2024-09-01 DIAGNOSIS — M328 Other forms of systemic lupus erythematosus: Secondary | ICD-10-CM | POA: Diagnosis not present

## 2024-09-01 DIAGNOSIS — M7918 Myalgia, other site: Secondary | ICD-10-CM | POA: Diagnosis not present

## 2024-09-01 DIAGNOSIS — G894 Chronic pain syndrome: Secondary | ICD-10-CM | POA: Diagnosis not present

## 2024-09-02 ENCOUNTER — Telehealth: Payer: Self-pay | Admitting: Neurology

## 2024-09-02 NOTE — Telephone Encounter (Signed)
 HST M[facility] healthy blue pending

## 2024-09-03 NOTE — Telephone Encounter (Signed)
 HST M[facility] healthy blue no auth req via fax.

## 2024-09-07 ENCOUNTER — Ambulatory Visit: Payer: Self-pay | Admitting: Obstetrics and Gynecology

## 2024-09-15 DIAGNOSIS — M3219 Other organ or system involvement in systemic lupus erythematosus: Secondary | ICD-10-CM | POA: Diagnosis not present

## 2024-10-06 ENCOUNTER — Other Ambulatory Visit: Payer: Self-pay | Admitting: Medical Genetics

## 2024-10-06 DIAGNOSIS — Z006 Encounter for examination for normal comparison and control in clinical research program: Secondary | ICD-10-CM

## 2024-10-14 ENCOUNTER — Ambulatory Visit: Admitting: Neurology

## 2024-10-14 ENCOUNTER — Other Ambulatory Visit: Payer: Self-pay | Admitting: Neurology

## 2024-10-14 DIAGNOSIS — G4733 Obstructive sleep apnea (adult) (pediatric): Secondary | ICD-10-CM

## 2024-10-14 DIAGNOSIS — G4719 Other hypersomnia: Secondary | ICD-10-CM

## 2024-10-14 DIAGNOSIS — R0683 Snoring: Secondary | ICD-10-CM

## 2024-10-15 NOTE — Progress Notes (Signed)
"  ° °  [facility]  HOME SLEEP STUDY  STUDY DATE: 10/14/24 PATIENT NAME: Diana Huynh DOB: [birth year] MRN: [id number]  ORDERING CLINICIAN: Richard A. Vear, MD, PhD INTERPRETING CLINICIAN: Richard A. Sater, MD. PhD   CLINICAL INFORMATION: 44 year old man with witnessed OSA snoring and excessive daytime sleepiness (Epworth Sleepiness Scale 18/24).   IMPRESSION:  OSA with a pAHI 3% of 13/h.  This was more severe during REM sleep with a REM pAHI 3% of 34/h.  Overall and REM related pAHI 4% was 7.7/h and 24.5/h.  OSA was more pronounced in the supine position. Normal sleep latency of 23 minutes.  Normal REM latency of 85 minutes total sleep time of 92% Negligible hypoxemia of less than 1 minute.   RECOMMENDATION: Due to the mild overall obstructive sleep apnea that was moderate to severe during REM and her excessive daytime sleepiness, consider AutoPap 5-15 cm H2O pressure with heated humidifier. Could also consider weight loss or an oral appliance Follow-up with Dr. Vear   INTERPRETING PHYSICIAN:   Charlie LABOR. Vear, MD, PhD, [facility] Certified in Neurology, Clinical Neurophysiology, Sleep Medicine, Pain Medicine and Neuroimaging  [facility] [address] [place], [place] [address] [phone]              "

## 2024-10-16 ENCOUNTER — Other Ambulatory Visit (HOSPITAL_COMMUNITY)

## 2024-10-23 LAB — GENECONNECT MOLECULAR SCREEN: Genetic Analysis Overall Interpretation: NEGATIVE

## 2024-10-28 ENCOUNTER — Telehealth: Payer: Self-pay

## 2024-10-28 DIAGNOSIS — G4733 Obstructive sleep apnea (adult) (pediatric): Secondary | ICD-10-CM

## 2024-10-28 NOTE — Telephone Encounter (Signed)
 Called and relayed the following information to pt: Her sleep study showed mild obstructive sleep apnea that was more severe during REM sleep.  She also has excessive daytime sleepiness.  Therefore, I would like her to start AutoPap with pressure settings 5-15 cm H2O and a heated humidifier.     If she is reluctant to begin CPAP, weight loss or an oral appliance might also be of benefit  Pt agreeable, order placed to dme, scheduled 10 week follow up w/ np

## 2024-10-29 NOTE — Telephone Encounter (Signed)
 SABRA

## 2024-10-30 ENCOUNTER — Encounter (HOSPITAL_BASED_OUTPATIENT_CLINIC_OR_DEPARTMENT_OTHER): Payer: Self-pay | Admitting: Physical Therapy

## 2024-10-30 ENCOUNTER — Other Ambulatory Visit: Payer: Self-pay

## 2024-10-30 ENCOUNTER — Ambulatory Visit (HOSPITAL_BASED_OUTPATIENT_CLINIC_OR_DEPARTMENT_OTHER): Attending: Pain Medicine | Admitting: Physical Therapy

## 2024-10-30 DIAGNOSIS — M542 Cervicalgia: Secondary | ICD-10-CM | POA: Insufficient documentation

## 2024-10-30 DIAGNOSIS — M62838 Other muscle spasm: Secondary | ICD-10-CM | POA: Diagnosis present

## 2024-10-30 DIAGNOSIS — G894 Chronic pain syndrome: Secondary | ICD-10-CM | POA: Insufficient documentation

## 2024-10-30 DIAGNOSIS — R2689 Other abnormalities of gait and mobility: Secondary | ICD-10-CM | POA: Insufficient documentation

## 2024-10-30 DIAGNOSIS — M5459 Other low back pain: Secondary | ICD-10-CM | POA: Diagnosis not present

## 2024-11-03 ENCOUNTER — Ambulatory Visit (HOSPITAL_BASED_OUTPATIENT_CLINIC_OR_DEPARTMENT_OTHER): Admitting: Physical Therapy

## 2024-11-03 ENCOUNTER — Encounter (HOSPITAL_BASED_OUTPATIENT_CLINIC_OR_DEPARTMENT_OTHER): Payer: Self-pay | Admitting: Physical Therapy

## 2024-11-03 DIAGNOSIS — M62838 Other muscle spasm: Secondary | ICD-10-CM

## 2024-11-03 DIAGNOSIS — M5459 Other low back pain: Secondary | ICD-10-CM

## 2024-11-03 DIAGNOSIS — R2689 Other abnormalities of gait and mobility: Secondary | ICD-10-CM

## 2024-11-03 DIAGNOSIS — M542 Cervicalgia: Secondary | ICD-10-CM

## 2024-11-06 ENCOUNTER — Ambulatory Visit (HOSPITAL_BASED_OUTPATIENT_CLINIC_OR_DEPARTMENT_OTHER): Admitting: Physical Therapy

## 2024-11-06 ENCOUNTER — Encounter (HOSPITAL_BASED_OUTPATIENT_CLINIC_OR_DEPARTMENT_OTHER): Payer: Self-pay | Admitting: Physical Therapy

## 2024-11-06 DIAGNOSIS — M62838 Other muscle spasm: Secondary | ICD-10-CM

## 2024-11-06 DIAGNOSIS — M542 Cervicalgia: Secondary | ICD-10-CM

## 2024-11-06 DIAGNOSIS — M5459 Other low back pain: Secondary | ICD-10-CM

## 2024-11-06 DIAGNOSIS — R2689 Other abnormalities of gait and mobility: Secondary | ICD-10-CM

## 2024-11-06 NOTE — Therapy (Cosign Needed)
 " OUTPATIENT PHYSICAL THERAPY CERVICAL TREATMENT   Patient Name: Diana Huynh MRN: [id number] DOB:1981/07/08, 44 y.o., female Today's Date: 11/06/2024  END OF SESSION:  PT End of Session - 11/06/24 1022     Visit Number 3    Number of Visits 16    Date for Recertification  12/25/24    PT Start Time 0931    PT Stop Time 1017    PT Time Calculation (min) 46 min    Activity Tolerance Patient tolerated treatment well;Patient limited by pain    Behavior During Therapy [facility] for tasks assessed/performed           Past Medical History:  Diagnosis Date   Bronchitis    Fibromyalgia    Lupus    Pericarditis    per pt report   Pleuritis 12/31/2017   Past Surgical History:  Procedure Laterality Date   ANKLE SURGERY     APPENDECTOMY     CESAREAN SECTION     Patient Active Problem List   Diagnosis Date Noted   Viral upper respiratory tract infection with cough 07/16/2024   Vitamin B12 deficiency 06/25/2024   Intractable nausea and vomiting 06/16/2024   Nausea & vomiting 06/15/2024   Pneumonia 06/15/2024   Neuropathy 06/12/2024   OSA (obstructive sleep apnea) 04/10/2024   Dysmenorrhea 03/02/2024   Ovarian cyst 12/23/2023   Raynaud disease 12/04/2023   Idiopathic intracranial hypertension 06/24/2023   Cervical stenosis of spinal canal 05/16/2023   Papular rash 02/20/2023   Folliculitis of axilla 02/20/2023   Mammary duct ectasia of breast, 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   Obesity, morbid, BMI 40.0-49.9 (HCC) 02/19/2023   Encounter for counseling regarding contraception 09/07/2022   Parotid mass 09/13/2021   Pericarditis 04/05/2021   GERD (gastroesophageal reflux disease) 02/12/2019   Incidental pulmonary nodule, > 3mm and < 8mm 02/12/2019   Healthcare maintenance 07/25/2018   Fibromyalgia 06/25/2018   Tobacco abuse 03/07/2018   Long-term use of Plaquenil  03/06/2018   Other forms of systemic lupus erythematosus (HCC) 03/06/2018   SLE (systemic lupus erythematosus) (HCC) 12/31/2017    Pericardial effusion     PCP: Armando Rossetti MD  REFERRING PROVIDER:   Constantino Bong, MD    REFERRING DIAG: G89.4 (ICD-10-CM) - Chronic pain syndrome   THERAPY DIAG:  Cervicalgia  Other muscle spasm  Other low back pain  Other abnormalities of gait and mobility  Rationale for Evaluation and Treatment: Rehabilitation  ONSET DATE: 1.5 years  SUBJECTIVE:  SUBJECTIVE STATEMENT: Pt reports that aquatic therapy went well. Pt's right knee has been giving her some trouble today. Pt reports that self massage and range of motion at home has been going well. She states that she knows she needs to get out of her head and trust her body.     From initial evaluation:  Back/spine: Pt has cervical stenosis and OA. Pt has a burning sensation at the baseline of their neck and it goes down their back. They had a lumbar procedure to remove fluid after going to an eye doctor and getting diagnosed with intercranial HTN after Dr noticed fluid buildup behind eyes.   Intercranial HTN: Blurs patient's vision, causes dizziness, and limits their ability to bend over. Pt states the blurred vision is getting better.  LE: Pt states that they have fibromyalgia and it affects their shoulders and legs. Has knee ache/pain. Pt has neuropathy in their feet and hands and starting a few months ago, has consistently felt a tingling in their feet when they walk.   UE: Fibromyalgia effects arms, raynauds and neuropathy in hands. Cannot tolerate cold weather.   Pt also has dx of lupus and states they have self isolated because going crowded places has gotten them sick.   Hand dominance: Right  PERTINENT HISTORY:  Sleep apnea, neuropathy, intercranial HTN, fibromyalgia, cervical stenosis, Lupus   PAIN:  Are you  having pain? Yes: NPRS scale: 6-710 Pain location: Neck, upper back/ lower back; knees Pain description: ache Aggravating factors: Bending over - due to intercranial HTN causes dizziness, moving around in general is painful and gets to beyond a 10 Relieving factors: 6 with heat, laying down  PRECAUTIONS: Other: Sensitive to touch   RED FLAGS: Wakes up from night pain regularly but has improved since getting on medicine that she takes at night     WEIGHT BEARING RESTRICTIONS: No  FALLS:  Has patient fallen in last 6 months? No near falls 2nd to syncope   LIVING ENVIRONMENT: Lives with: lives with their family  OCCUPATION: None  PLOF: Needs assistance with ADLs and Needs assistance with homemaking  PATIENT GOALS: To decrease pain levels and get back to reading. Vision is blurry because of intercranial HTN  NEXT MD VISIT: Nothing scheduled.   OBJECTIVE:  Note: Objective measures were completed at Evaluation unless otherwise noted.  DIAGNOSTIC FINDINGS:  None provided  PATIENT SURVEYS:  MODI  36/50  COGNITION: Overall cognitive status: Within functional limits for tasks assessed  SENSATION: Light touch: Impaired  Hot/Cold: Impaired   POSTURE: rounded shoulders  PALPATION: Did not assess - painful    CERVICAL ROM: Gross measurements taken secondary to pain  Active ROM A/PROM (deg) eval  Flexion Chin to chest  Extension ~10  Right lateral flexion   Left lateral flexion   Right rotation Cannot do so  Left rotation Cannot do so   (Blank rows = not tested)  UPPER EXTREMITY ROM: Deferred secondary to pain   Active ROM Right eval Left eval  Shoulder flexion    Shoulder extension    Shoulder abduction    Shoulder adduction    Shoulder extension    Shoulder internal rotation    Shoulder external rotation    Elbow flexion    Elbow extension    Wrist flexion    Wrist extension    Wrist ulnar deviation    Wrist radial deviation    Wrist pronation     Wrist supination     (Blank rows =  not tested)  LOWER EXTREMITY ROM:     Active  Right eval Left eval  Hip flexion Able to bend to 90 to sit in chair Able to bend to 90 to sit in chair  Hip extension    Hip abduction    Hip adduction    Hip internal rotation    Hip external rotation    Knee flexion >100 Measured with patient sitting and bending knee back --> >100 with pain  Knee extension Full with pain Full with pain   Ankle dorsiflexion    Ankle plantarflexion    Ankle inversion    Ankle eversion     (Blank rows = not tested)   UPPER EXTREMITY MMT: Deferred secondary to pain   MMT Right eval Left eval  Shoulder flexion    Shoulder extension    Shoulder abduction    Shoulder adduction    Shoulder extension    Shoulder internal rotation    Shoulder external rotation    Middle trapezius    Lower trapezius    Elbow flexion    Elbow extension    Wrist flexion    Wrist extension    Wrist ulnar deviation    Wrist radial deviation    Wrist pronation    Wrist supination    Grip strength     (Blank rows = not tested)  LOWER EXTREMITY MMT:    MMT Right eval Left eval  Hip flexion 5.9  6. 0   Hip extension    Hip abduction 5.5 5.8  Hip adduction    Hip internal rotation    Hip external rotation    Knee flexion    Knee extension 5.0 5.1  Ankle dorsiflexion    Ankle plantarflexion    Ankle inversion    Ankle eversion     (Blank rows = not tested)  TREATMENT DATE:  2/6 There-ex:  nustep until pt tolerance 3  Frwd pushes with pillowcase on elevated table from seated position in chair Heel raises x 10 with UE support Pt tried theracane to help work spot in mid-right upper back but had to stop secondary to pain  Manual: Self-STM with gentle squeezes to right arm  There-ex: Tennis ball grip squeezes (inc blood flow to hands secondary to rhenauds) 2x10 bilat Seated calf stretch - extending leg with ankle dorsiflexed and back to bent position 2x10  bilat Bicep curl no weight x15 left arm Contralateral shoulder taps x10 bilat   [facility] Adult PT Treatment:                                             Date: 11/03/24 Pt seen for aquatic therapy today.  Treatment took place in water 3.5-4.75 ft in depth at the [facility] pool. Temp of water was 91.  Pt entered/exited the pool via stairs in step-to pattern independently (heavy use of rail).  - Intro to aquatic therapy principles - UE on barbell walking forward x 2 laps / backward 1 width - UE on barbell side stepping 1/2 width each direction (Lt knee burning)  - seated on bench in water:  LAQ x 5 each; scap squeeze sliding hands on thighs x 3; row motion with UE on barbell x 9; bil shoulder flexion/extension x 5; bil shoulder horiz abdct/ add x 5  - walking unsupported submerged 75%  Pt requires the buoyancy and  hydrostatic pressure of water for support, and to offload joints by unweighting joint load by at least 50 % in navel deep water and by at least 75-80% in chest to neck deep water.  Viscosity of the water is needed for resistance of strengthening. Water current perturbations provides challenge to standing balance requiring increased core activation.    PATIENT EDUCATION:  Education details: intro to aquatic therapy   Person educated: Patient Education method: Explanation Education comprehension: verbalized understanding  HOME EXERCISE PROGRAM: Improving ROM Working through the available motions that you have  Being mindful and saying aloud -  I see this motion happening, I'm not injuring myself, my body is okay, this motion is okay. I am in pain but this motion is okay. Even if you're moving a quarter of an inch, work in that range. The goal is trying to get further through the range a little more each day and retraining your brain to accept that motion. Self massage Introducing light touch to areas of your body and retraining your brain  Maybe the first time is gentle  touch with your own hand, maybe we progress to a tennis ball later on down the road.  Saying aloud I see this happening, I'm not injuring myself, my body is okay, this touch is okay. I am in pain but this touch is not harming me, my brain is trying to protect me.  ASSESSMENT:  CLINICAL IMPRESSION: Pt came in today in good spirits following aquatic therapy. PT focused on introducing pt to gentle stretching, exercise, and overall worked to increase motion. Pt's right side was experiencing some flare ups in her knee and right shoulder/back. PT incorporated pt ed re: self - STM and suggested when pt feels flare-ups with burning pain, to place cold packs in a satin pillowcase or wrap and gently place on affected areas to help with desensitization. Pt is overall demonstrating a lot of improvement in the mental component of her healing process and is doing well with knowing when to push herself and when to listen to her body. Overall, PT observed improvements in ROM and pt tolerance today.    Initial impression:  Patient is a 44 y.o. female who was seen today for physical therapy evaluation and treatment for chronic pain management.  Pt was receptive to ed regarding chronic pain and demonstrated understanding regarding the idea of retraining their brain. Pt was excited at the idea of starting aquatic therapy and to experience some relief in the pool. Pt has very limited range and strength and has high pain sensitivity at this point in treatment. Pt understood that the first phase of treatment will most likely be focusing on calming their system down so that they can build things up in future phases. Overall, pt will benefit from continued pt ed regarding how to progress their motion and modify ADLs. Pt will benefit from aquatic therapy and a gravity-reduced environment.   OBJECTIVE IMPAIRMENTS: Abnormal gait, decreased activity tolerance, decreased balance, decreased coordination, decreased endurance,  decreased mobility, difficulty walking, decreased ROM, decreased strength, dizziness, increased edema, impaired perceived functional ability, increased muscle spasms, impaired UE functional use, impaired vision/preception, obesity, and pain.   ACTIVITY LIMITATIONS: carrying, lifting, bending, sitting, standing, squatting, stairs, bathing, dressing, reach over head, and hygiene/grooming  PARTICIPATION LIMITATIONS: meal prep, cleaning, laundry, shopping, and community activity  PERSONAL FACTORS: Past/current experiences and Time since onset of injury/illness/exacerbation are also affecting patient's functional outcome.   REHAB POTENTIAL: Fair Pt has been dealing with high  pain severity and irritability for a long time and has had to isolate themselves due to Lupus. Pt will need pt ed on managing chronic pain and has dispersed ROM and MMT goals.   CLINICAL DECISION MAKING: Unstable/unpredictable  EVALUATION COMPLEXITY: High   GOALS: Goals reviewed with patient? Yes  SHORT TERM GOALS: Target date: 11/27/2024    Pt will inc Cerv ext ROM by 10 without pain flare-up Baseline:  Goal status: INITIAL  2.  Pt will be able to receive PT STM without pain response  Baseline:  Goal status: INITIAL   4.  Pt will be able to extend and flex knee with less pain in aquatic therapy  Baseline:  Goal status: INITIAL  5.  Pt will be able to hold knee in extended position for MMT bilateral   Baseline:  Goal status: INITIAL   LONG TERM GOALS: Target date: 12/25/2024  Pt will have a developed understanding of how to progressively inc strength and ROM  Baseline:  Goal status: INITIAL  2.  Pt will have an understanding of how aquatic therapy can help pain Baseline:  Goal status: INITIAL  3.  Pt will have inc tolerance for movement in the aquatic pool - inc ROM  in shoulder in all plains by 10-15  Baseline:  Goal status: INITIAL  4.  Pt will perform ADL's with self report of > 3/10 pain   Baseline:  Goal status: INITIAL    PLAN:  PT FREQUENCY: 1-2x/week  PT DURATION: 8 weeks  PLANNED INTERVENTIONS: 97110-Therapeutic exercises, 97530- Therapeutic activity, 97112- Neuromuscular re-education, 97535- Self Care, [id number]- Manual therapy, [phone]- Gait training, [phone]- Aquatic Therapy, [phone] (1-2 muscles), [phone] (3+ muscles)- Dry Needling, Patient/Family education, Balance training, Stair training, Joint mobilization, Joint manipulation, Spinal manipulation, Spinal mobilization, Manual lymph drainage, and Vestibular training  PLAN FOR NEXT SESSION: Try to get patient in for aquatic therapy to help alleviate pain levels and work on basic ROM -  moving through the range and controlling pain response reaction. Gait training. UE/LE strengthening.   Land: consider low level new step/ AAROM/ light soft tissue work but may not tolerate.      "

## 2024-11-13 ENCOUNTER — Encounter (HOSPITAL_BASED_OUTPATIENT_CLINIC_OR_DEPARTMENT_OTHER): Admitting: Physical Therapy

## 2024-11-17 ENCOUNTER — Ambulatory Visit (HOSPITAL_BASED_OUTPATIENT_CLINIC_OR_DEPARTMENT_OTHER): Payer: Self-pay | Admitting: Physical Therapy

## 2024-11-20 ENCOUNTER — Encounter (HOSPITAL_BASED_OUTPATIENT_CLINIC_OR_DEPARTMENT_OTHER): Admitting: Physical Therapy

## 2024-11-24 ENCOUNTER — Ambulatory Visit (HOSPITAL_BASED_OUTPATIENT_CLINIC_OR_DEPARTMENT_OTHER): Payer: Self-pay | Admitting: Physical Therapy

## 2024-12-01 ENCOUNTER — Ambulatory Visit (HOSPITAL_BASED_OUTPATIENT_CLINIC_OR_DEPARTMENT_OTHER): Admitting: Physical Therapy

## 2024-12-04 ENCOUNTER — Encounter (HOSPITAL_BASED_OUTPATIENT_CLINIC_OR_DEPARTMENT_OTHER): Admitting: Physical Therapy

## 2024-12-09 ENCOUNTER — Ambulatory Visit (HOSPITAL_BASED_OUTPATIENT_CLINIC_OR_DEPARTMENT_OTHER): Admitting: Physical Therapy

## 2024-12-11 ENCOUNTER — Encounter (HOSPITAL_BASED_OUTPATIENT_CLINIC_OR_DEPARTMENT_OTHER): Admitting: Physical Therapy

## 2024-12-15 ENCOUNTER — Ambulatory Visit (HOSPITAL_BASED_OUTPATIENT_CLINIC_OR_DEPARTMENT_OTHER): Admitting: Physical Therapy

## 2024-12-18 ENCOUNTER — Encounter (HOSPITAL_BASED_OUTPATIENT_CLINIC_OR_DEPARTMENT_OTHER): Admitting: Physical Therapy

## 2024-12-22 ENCOUNTER — Ambulatory Visit (HOSPITAL_BASED_OUTPATIENT_CLINIC_OR_DEPARTMENT_OTHER): Admitting: Physical Therapy

## 2024-12-25 ENCOUNTER — Encounter (HOSPITAL_BASED_OUTPATIENT_CLINIC_OR_DEPARTMENT_OTHER): Admitting: Physical Therapy

## 2024-12-29 ENCOUNTER — Ambulatory Visit (HOSPITAL_BASED_OUTPATIENT_CLINIC_OR_DEPARTMENT_OTHER): Admitting: Physical Therapy

## 2024-12-31 ENCOUNTER — Encounter (HOSPITAL_BASED_OUTPATIENT_CLINIC_OR_DEPARTMENT_OTHER): Admitting: Physical Therapy

## 2025-01-19 ENCOUNTER — Encounter: Admitting: Neurology

## 2025-08-17 ENCOUNTER — Ambulatory Visit: Admitting: Neurology
